# Patient Record
Sex: Male | Born: 1960 | Race: Black or African American | Hispanic: No | Smoking: Never smoker
Health system: Southern US, Community
[De-identification: ages and names within clinical notes are randomized; demographics above are authoritative.]

## PROBLEM LIST (undated history)

## (undated) DIAGNOSIS — I2699 Other pulmonary embolism without acute cor pulmonale: Secondary | ICD-10-CM

## (undated) DIAGNOSIS — E119 Type 2 diabetes mellitus without complications: Secondary | ICD-10-CM

## (undated) DIAGNOSIS — N182 Chronic kidney disease, stage 2 (mild): Secondary | ICD-10-CM

---

## 2014-10-08 ENCOUNTER — Inpatient Hospital Stay (HOSPITAL_COMMUNITY)
Admission: EM | Admit: 2014-10-08 | Discharge: 2014-10-11 | DRG: 638 | Disposition: A | Discharge status: Home / Self Care | Payer: 59 | Attending: Internal Medicine | Admitting: Internal Medicine

## 2014-10-08 ENCOUNTER — Emergency Department (HOSPITAL_COMMUNITY): Payer: 59

## 2014-10-08 ENCOUNTER — Encounter (HOSPITAL_COMMUNITY): Payer: Self-pay

## 2014-10-08 DIAGNOSIS — E669 Obesity, unspecified: Secondary | ICD-10-CM | POA: Diagnosis present

## 2014-10-08 DIAGNOSIS — B3741 Candidal cystitis and urethritis: Secondary | ICD-10-CM | POA: Diagnosis present

## 2014-10-08 DIAGNOSIS — K219 Gastro-esophageal reflux disease without esophagitis: Secondary | ICD-10-CM | POA: Diagnosis present

## 2014-10-08 DIAGNOSIS — N179 Acute kidney failure, unspecified: Secondary | ICD-10-CM | POA: Diagnosis not present

## 2014-10-08 DIAGNOSIS — E1110 Type 2 diabetes mellitus with ketoacidosis without coma: Secondary | ICD-10-CM | POA: Diagnosis present

## 2014-10-08 DIAGNOSIS — E1310 Other specified diabetes mellitus with ketoacidosis without coma: Principal | ICD-10-CM | POA: Diagnosis present

## 2014-10-08 DIAGNOSIS — Z6833 Body mass index (BMI) 33.0-33.9, adult: Secondary | ICD-10-CM | POA: Diagnosis not present

## 2014-10-08 DIAGNOSIS — D72829 Elevated white blood cell count, unspecified: Secondary | ICD-10-CM | POA: Diagnosis present

## 2014-10-08 DIAGNOSIS — E781 Pure hyperglyceridemia: Secondary | ICD-10-CM | POA: Diagnosis present

## 2014-10-08 DIAGNOSIS — Z8249 Family history of ischemic heart disease and other diseases of the circulatory system: Secondary | ICD-10-CM | POA: Diagnosis not present

## 2014-10-08 DIAGNOSIS — K5900 Constipation, unspecified: Secondary | ICD-10-CM | POA: Diagnosis present

## 2014-10-08 DIAGNOSIS — R358 Other polyuria: Secondary | ICD-10-CM | POA: Diagnosis not present

## 2014-10-08 LAB — URINALYSIS, ROUTINE W REFLEX MICROSCOPIC
Bilirubin Urine: NEGATIVE
Ketones, ur: 40 mg/dL — AB
Nitrite: NEGATIVE
PROTEIN: NEGATIVE mg/dL
Specific Gravity, Urine: 1.033 — ABNORMAL HIGH (ref 1.005–1.030)
UROBILINOGEN UA: 0.2 mg/dL (ref 0.0–1.0)
pH: 5 (ref 5.0–8.0)

## 2014-10-08 LAB — COMPREHENSIVE METABOLIC PANEL
ALT: 27 U/L (ref 17–63)
AST: 44 U/L — ABNORMAL HIGH (ref 15–41)
Albumin: 3.9 g/dL (ref 3.5–5.0)
Alkaline Phosphatase: 85 U/L (ref 38–126)
Anion gap: 16 — ABNORMAL HIGH (ref 5–15)
BUN: 28 mg/dL — ABNORMAL HIGH (ref 6–20)
CO2: 21 mmol/L — ABNORMAL LOW (ref 22–32)
Calcium: 8.7 mg/dL — ABNORMAL LOW (ref 8.9–10.3)
Chloride: 89 mmol/L — ABNORMAL LOW (ref 101–111)
Creatinine, Ser: 1.94 mg/dL — ABNORMAL HIGH (ref 0.61–1.24)
GFR calc non Af Amer: 38 mL/min — ABNORMAL LOW (ref >60)
GFR, EST AFRICAN AMERICAN: 44 mL/min — AB (ref >60)
GLUCOSE: 610 mg/dL — AB (ref 65–99)
Potassium: 4.9 mmol/L (ref 3.5–5.1)
Sodium: 126 mmol/L — ABNORMAL LOW (ref 135–145)
Total Bilirubin: 1.1 mg/dL (ref 0.3–1.2)
Total Protein: 8.5 g/dL — ABNORMAL HIGH (ref 6.5–8.1)

## 2014-10-08 LAB — CBC WITH DIFFERENTIAL/PLATELET
BASOS ABS: 0 10*3/uL (ref 0.0–0.1)
BASOS PCT: 0 % (ref 0–1)
EOS ABS: 0.1 10*3/uL (ref 0.0–0.7)
Eosinophils Relative: 1 % (ref 0–5)
HCT: 42.6 % (ref 39.0–52.0)
Hemoglobin: 14.8 g/dL (ref 13.0–17.0)
Lymphocytes Relative: 23 % (ref 12–46)
Lymphs Abs: 2.5 10*3/uL (ref 0.7–4.0)
MCH: 30.6 pg (ref 26.0–34.0)
MCHC: 34.7 g/dL (ref 30.0–36.0)
MCV: 88 fL (ref 78.0–100.0)
Monocytes Absolute: 0.8 10*3/uL (ref 0.1–1.0)
Monocytes Relative: 7 % (ref 3–12)
NEUTROS ABS: 7.4 10*3/uL (ref 1.7–7.7)
Neutrophils Relative %: 69 % (ref 43–77)
PLATELETS: 184 10*3/uL (ref 150–400)
RBC: 4.84 MIL/uL (ref 4.22–5.81)
RDW: 12.4 % (ref 11.5–15.5)
WBC: 10.9 10*3/uL — ABNORMAL HIGH (ref 4.0–10.5)

## 2014-10-08 LAB — GLUCOSE, CAPILLARY
GLUCOSE-CAPILLARY: 409 mg/dL — AB (ref 65–99)
GLUCOSE-CAPILLARY: 534 mg/dL — AB (ref 65–99)
GLUCOSE-CAPILLARY: 275 mg/dL — AB (ref 65–99)
GLUCOSE-CAPILLARY: 193 mg/dL — AB (ref 65–99)
Glucose-Capillary: 506 mg/dL — ABNORMAL HIGH (ref 65–99)

## 2014-10-08 LAB — BASIC METABOLIC PANEL
Anion gap: 11 (ref 5–15)
Anion gap: 9 (ref 5–15)
BUN: 24 mg/dL — ABNORMAL HIGH (ref 6–20)
BUN: 22 mg/dL — ABNORMAL HIGH (ref 6–20)
CHLORIDE: 96 mmol/L — AB (ref 101–111)
CHLORIDE: 103 mmol/L (ref 101–111)
CO2: 24 mmol/L (ref 22–32)
CO2: 22 mmol/L (ref 22–32)
CREATININE: 1.62 mg/dL — AB (ref 0.61–1.24)
CREATININE: 1.42 mg/dL — AB (ref 0.61–1.24)
Calcium: 8.1 mg/dL — ABNORMAL LOW (ref 8.9–10.3)
Calcium: 7.9 mg/dL — ABNORMAL LOW (ref 8.9–10.3)
GFR calc Af Amer: >60 mL/min (ref >60)
GFR calc non Af Amer: 48 mL/min — ABNORMAL LOW (ref >60)
GFR calc non Af Amer: 56 mL/min — ABNORMAL LOW (ref >60)
GFR, EST AFRICAN AMERICAN: 55 mL/min — AB (ref >60)
Glucose, Bld: 439 mg/dL — ABNORMAL HIGH (ref 65–99)
Glucose, Bld: 235 mg/dL — ABNORMAL HIGH (ref 65–99)
POTASSIUM: 4.4 mmol/L (ref 3.5–5.1)
POTASSIUM: 4 mmol/L (ref 3.5–5.1)
Sodium: 131 mmol/L — ABNORMAL LOW (ref 135–145)
Sodium: 134 mmol/L — ABNORMAL LOW (ref 135–145)

## 2014-10-08 LAB — TSH: TSH: 1.641 u[IU]/mL (ref 0.350–4.500)

## 2014-10-08 LAB — ETHANOL: Alcohol, Ethyl (B): <5 mg/dL (ref <5)

## 2014-10-08 LAB — BETA-HYDROXYBUTYRIC ACID: Beta-Hydroxybutyric Acid: 1.47 mmol/L — ABNORMAL HIGH (ref 0.05–0.27)

## 2014-10-08 LAB — URINE MICROSCOPIC-ADD ON

## 2014-10-08 LAB — T4, FREE: FREE T4: 1.23 ng/dL — AB (ref 0.61–1.12)

## 2014-10-08 LAB — CBG MONITORING, ED: Glucose-Capillary: 563 mg/dL (ref 65–99)

## 2014-10-08 LAB — MRSA PCR SCREENING: MRSA BY PCR: NEGATIVE

## 2014-10-08 MED ORDER — SODIUM CHLORIDE 0.9 % IV SOLN: INTRAVENOUS | Status: DC

## 2014-10-08 MED ORDER — SODIUM CHLORIDE 0.9 % IV SOLN
INTRAVENOUS | Status: AC
  Administered 2014-10-08 (×3): via INTRAVENOUS

## 2014-10-08 MED ORDER — SODIUM CHLORIDE 0.9 % IV SOLN
INTRAVENOUS | Status: DC
  Administered 2014-10-08: 4.5 [IU]/h via INTRAVENOUS
  Filled 2014-10-08: qty 2.5

## 2014-10-08 MED ORDER — INSULIN REGULAR HUMAN 100 UNIT/ML IJ SOLN
INTRAMUSCULAR | Status: DC
  Administered 2014-10-08: 4.5 [IU]/h via INTRAVENOUS
  Filled 2014-10-08: qty 2.5

## 2014-10-08 MED ORDER — SODIUM CHLORIDE 0.9 % IV BOLUS (SEPSIS)
1000.0000 mL | Freq: Once | INTRAVENOUS | Status: AC
  Administered 2014-10-08: 1000 mL via INTRAVENOUS

## 2014-10-08 MED ORDER — SODIUM CHLORIDE 0.9 % IV SOLN
INTRAVENOUS | Status: DC
  Administered 2014-10-08: 100 mL via INTRAVENOUS

## 2014-10-08 MED ORDER — ENOXAPARIN SODIUM 80 MG/0.8ML ~~LOC~~ SOLN
65.0000 mg | SUBCUTANEOUS | Status: DC
Start: 2014-10-08 — End: 2014-10-11
  Administered 2014-10-08 – 2014-10-10 (×3): 65 mg via SUBCUTANEOUS
  Filled 2014-10-08 (×5): qty 0.8

## 2014-10-08 MED ORDER — FAMOTIDINE 20 MG PO TABS
20.0000 mg | ORAL_TABLET | Freq: Two times a day (BID) | ORAL | Status: DC | PRN
  Filled 2014-10-08: qty 1

## 2014-10-08 MED ORDER — DEXTROSE-NACL 5-0.45 % IV SOLN: INTRAVENOUS | Status: DC

## 2014-10-08 MED ORDER — DEXTROSE-NACL 5-0.45 % IV SOLN
INTRAVENOUS | Status: DC
  Administered 2014-10-08: 75 mL via INTRAVENOUS

## 2014-10-08 MED ORDER — SODIUM CHLORIDE 0.9 % IV SOLN: 1000.0000 mL | INTRAVENOUS | Status: DC

## 2014-10-08 NOTE — H&P (Addendum)
[facility] History and Physical  Jacob Donaldson ONG:[id number] DOB: Jun 14, 1960 DOA: 10/08/2014  Referring physician: Dr Jeraldine Loots PCP: No primary care provider on file.   Chief Complaint:  Blurred vision, fatigue, polyuria, polydipsia, dizziness x 1 week  HPI:  54 year old obse male with no past medical hx presented to ED with one week of polyuria, polydipsia, nausea with several episodes of vomiting, dehydration, dizziness and lightheadedness with blurred vision. Patient denies any recent illness or travel. Denies being on any medication except for Pepcid for acid reflux symptoms. He denies any fevers or chills, chest pain, palpitations, shortness of breath, abdominal pain, diarrhea or dysuria. He reports poor appetite and losing some weight for the past 1 week. Denies any tingling or numbness in his extremities.  Course in the ED Patient had elevated blood pressure. Blood work done showed mild leukocytosis, normal hemoglobin and platelets. Chemistry showed sodium of 126 (134 corrected ), potassium of 4.9, anion gap of 16, BUN of 28 and creatinine 1.94 and glucose of 610. UA showed significant glucosuria and positive ketones. Patient started on a glucose stabilizer for new onset diabetes with DKA. Hospitalists admission requested to stepdown monitoring.  Review of Systems:  Constitutional: Denies fever, chills, diaphoresis, poor appetite and fatigue.  HEENT: blurred vision, dizziness, , denies eye pain, redness, hearing loss, ear pain, congestion, sore throat, , trouble swallowing, neck pain, neck stiffness and tinnitus.   Respiratory: Denies SOB, DOE, cough, chest tightness,  and wheezing.   Cardiovascular: Denies chest pain, palpitations and leg swelling.  Gastrointestinal: nausea, vomiting, abdominal pain, Denies  diarrhea, constipation, blood in stool and abdominal distention.  Genitourinary: Denies dysuria, hematuria, flank pain and difficulty urinating.  Endocrine: polyuria,  polydipsia, Denies: hot or cold intolerance, sweats Musculoskeletal: Denies myalgias, back pain, joint pain or swelling.  Skin: Denies pallor, rash and wound.  Neurological: dizziness, lightheadedness, weakness, denies seizures, syncope,  , numbness and headaches.  Hematological: Denies adenopathy.  Psychiatric/Behavioral: Denies confusion  History reviewed. No pertinent past medical history.  No past surgical history   Social History:  reports that he has never smoked. He does not have any smokeless tobacco history on file. He reports that he does not drink alcohol. His drug history is not on file.  No Known Allergies  Family history  mother had heart disease No family hx of diabetes mellitus   Prior to Admission medications   Medication Sig Start Date End Date Taking? Authorizing Provider  Calcium Carbonate Antacid (ROLAIDS EXTRA STRENGTH) 1177 MG CHEW Chew 1 tablet by mouth 3 (three) times daily as needed (acid reflux).   Yes Historical Provider, MD  famotidine (PEPCID) 20 MG tablet Take 20 mg by mouth 2 (two) times daily as needed for heartburn or indigestion.   Yes Historical Provider, MD     Physical Exam:  Filed Vitals:   10/08/14 1341 10/08/14 1518  BP: 154/104 132/94  Pulse: 102 84  Temp: 98.3 F (36.8 C)   TempSrc: Oral   Resp: 18 15  SpO2: 99% 99%    Constitutional: Vital signs reviewed.  Middle aged obese male in NAD HEENT: no pallor, no icterus, dry oral mucosa, no cervical lymphadenopathy Cardiovascular: RRR, S1 normal, S2 normal, no MRG Chest: CTAB, no wheezes, rales, or rhonchi Abdominal: Soft. Non-tender, non-distended, bowel sounds are normal,  Ext: warm, no edema Neurological: A&O x3, non focal  Labs on Admission:  Basic Metabolic Panel:  Recent Labs Lab 10/08/14 1358  NA 126*  K 4.9  CL 89*  CO2 21*  GLUCOSE 610*  BUN 28*  CREATININE 1.94*  CALCIUM 8.7*   Liver Function Tests:  Recent Labs Lab 10/08/14 1358  AST 44*  ALT 27   ALKPHOS 85  BILITOT 1.1  PROT 8.5*  ALBUMIN 3.9   No results for input(s): LIPASE, AMYLASE in the last 168 hours. No results for input(s): AMMONIA in the last 168 hours. CBC:  Recent Labs Lab 10/08/14 1358  WBC 10.9*  NEUTROABS 7.4  HGB 14.8  HCT 42.6  MCV 88.0  PLT 184   Cardiac Enzymes: No results for input(s): CKTOTAL, CKMB, CKMBINDEX, TROPONINI in the last 168 hours. BNP: Invalid input(s): POCBNP CBG:  Recent Labs Lab 10/08/14 1357  GLUCAP 563*    Radiological Exams on Admission: Ct Head Wo Contrast  10/08/2014   CLINICAL DATA:  Blurred vision.  Fatigue.  EXAM: CT HEAD WITHOUT CONTRAST  TECHNIQUE: Contiguous axial images were obtained from the base of the skull through the vertex without intravenous contrast.  COMPARISON:  None.  FINDINGS: No acute intracranial abnormality. Specifically, no hemorrhage, hydrocephalus, mass lesion, acute infarction, or significant intracranial injury. No acute calvarial abnormality. Visualized paranasal sinuses and mastoids clear. Orbital soft tissues unremarkable.  IMPRESSION: No acute intracranial abnormality.   Electronically Signed   By: Charlett Nose M.D.   On: 10/08/2014 14:53    EKG: pending  Assessment/Plan  DKA/ hyperosmolar non ketotic state Admit to stepdown Started on insulin drip in ED. Received 1L NS bolus in ED. Will order another 2 L NS bolus followed by maintenance  Fluid. Check BMET 3-4 hrs for closure of anion gap. Monitor k and mg. Once anion gap closes will add basal insulin and overlap drip for next 2 hours. Check A1C and beta Hydroxybutarate levels. Ketones+ in urine. Diabetic coordinator consult.  discussed extensively on lifestyle modifications, diet and blood glucose control as outpt.   Acute kidney injury  possibly prerenal associated with dehydration. UA reviewed.. Monitor with IV fluids.  Blurred vision  associated with hyperglycemia. Monitor with blood glucose control.  Pseudo Hyponatremia   corrected sodium of 138. Monitor lytes.   obesity counseled on weight loss and exercise. Check lipid panel in am  Diet:cardiac  DVT prophylaxis: sq lovenox   Code Status: full code Family Communication: discussed with patient and his wife at bedside Disposition Plan: admit to stepdown . Home possibly in 48 hrs  Shaquila Sigman [facility] Pager [phone]  Total time spent on admission :70 minutes  If 7PM-7AM, please contact night-coverage [email or web address] Password [email or web address] 10/08/2014, 4:05 PM

## 2014-10-08 NOTE — ED Notes (Addendum)
Raoul Pitch, RN notified of pt. Blood sugar 610 as reported by lab.

## 2014-10-08 NOTE — ED Notes (Signed)
Patient transported to CT 

## 2014-10-08 NOTE — ED Notes (Signed)
He c/o blurred vision, plus polydipsia and polyuria and unusual fatigue for about a week.  He is in no distress.  His wife states she is a Engineer, civil (consulting) with [employer] in [place] and checked pt. cbg this morning and it was "high".  Dr. Jeraldine Loots meets pt. In rm.

## 2014-10-08 NOTE — ED Provider Notes (Signed)
CSN: [id number]     Arrival date & time 10/08/14  1318 History   First MD Initiated Contact with Patient 10/08/14 1331     Chief Complaint  Patient presents with  . Blurred Vision     (Consider location/radiation/quality/duration/timing/severity/associated sxs/prior Treatment) HPI Patient presents with concern of new blurred vision, fatigue, anorexia, nausea. Symptoms began about one week ago, without clear precipitant. Since onset symptoms of been persistent, with associated polyuria. No recent travel, medication changes, diet changes. Patient takes no medication regularly, does not smoke, does not drink. Patient has no history of diabetes. Patient denies focal pain, focal weakness, confusion, disorders, speech deficit. Prior to the onset, the patient normal vision. Since onset of blurriness substantial, interfering with driving. Patient's wife is a Engineer, civil (consulting), checked his glucose, found it to be unusually high, just prior to arrival.  History reviewed. No pertinent past medical history. No past surgical history on file. No family history on file. History  Substance Use Topics  . Smoking status: Never Smoker   . Smokeless tobacco: Not on file  . Alcohol Use: No    Review of Systems  Constitutional:       Per HPI, otherwise negative  HENT:       Per HPI, otherwise negative  Eyes: Positive for visual disturbance.  Respiratory:       Per HPI, otherwise negative  Cardiovascular:       Per HPI, otherwise negative  Gastrointestinal: Positive for nausea and constipation. Negative for vomiting and abdominal pain.  Endocrine:       Negative aside from HPI  Genitourinary:       Neg aside from HPI   Musculoskeletal:       Per HPI, otherwise negative  Skin: Negative.   Neurological: Negative for syncope.      Allergies  Review of patient's allergies indicates no known allergies.  Home Medications   Prior to Admission medications   Medication Sig Start Date End Date  Taking? Authorizing Provider  Calcium Carbonate Antacid (ROLAIDS EXTRA STRENGTH) 1177 MG CHEW Chew 1 tablet by mouth 3 (three) times daily as needed (acid reflux).   Yes Historical Provider, MD  famotidine (PEPCID) 20 MG tablet Take 20 mg by mouth 2 (two) times daily as needed for heartburn or indigestion.   Yes Historical Provider, MD   BP 154/104 mmHg  Pulse 102  Temp(Src) 98.3 F (36.8 C) (Oral)  Resp 18  SpO2 99% Physical Exam  Constitutional: He is oriented to person, place, and time. He appears well-developed. No distress.  HENT:  Head: Normocephalic and atraumatic.  Eyes: Conjunctivae and EOM are normal. Pupils are equal, round, and reactive to light. Right eye exhibits no discharge. Left eye exhibits no discharge.  Patient cannot read signage 6 feet away.  Cardiovascular: Normal rate and regular rhythm.   Pulmonary/Chest: Effort normal. No stridor. No respiratory distress.  Abdominal: He exhibits no distension.  Musculoskeletal: He exhibits no edema.  Neurological: He is alert and oriented to person, place, and time. No cranial nerve deficit. He exhibits normal muscle tone. Coordination normal.  Skin: Skin is warm and dry.  Psychiatric: He has a normal mood and affect.  Nursing note and vitals reviewed.   ED Course  Procedures (including critical care time) Labs Review Labs Reviewed  COMPREHENSIVE METABOLIC PANEL - Abnormal; Notable for the following:    Sodium 126 (*)    Chloride 89 (*)    CO2 21 (*)    Glucose, Bld 610 (*)  BUN 28 (*)    Creatinine, Ser 1.94 (*)    Calcium 8.7 (*)    Total Protein 8.5 (*)    AST 44 (*)    GFR calc non Af Amer 38 (*)    GFR calc Af Amer 44 (*)    Anion gap 16 (*)    All other components within normal limits  CBC WITH DIFFERENTIAL/PLATELET - Abnormal; Notable for the following:    WBC 10.9 (*)    All other components within normal limits  CBG MONITORING, ED - Abnormal; Notable for the following:    Glucose-Capillary 563 (*)     All other components within normal limits  TSH  T4, FREE  URINALYSIS, ROUTINE W REFLEX MICROSCOPIC (NOT AT [facility])  ETHANOL  I-STAT CHEM 8, ED  I-STAT CG4 LACTIC ACID, ED    Imaging Review Ct Head Wo Contrast  10/08/2014   CLINICAL DATA:  Blurred vision.  Fatigue.  EXAM: CT HEAD WITHOUT CONTRAST  TECHNIQUE: Contiguous axial images were obtained from the base of the skull through the vertex without intravenous contrast.  COMPARISON:  None.  FINDINGS: No acute intracranial abnormality. Specifically, no hemorrhage, hydrocephalus, mass lesion, acute infarction, or significant intracranial injury. No acute calvarial abnormality. Visualized paranasal sinuses and mastoids clear. Orbital soft tissues unremarkable.  IMPRESSION: No acute intracranial abnormality.   Electronically Signed   By: Charlett Nose M.D.   On: 10/08/2014 14:53    Initial blood glucose greater than 500. Fluids started.  ECG w 58, sr, substantial artefact - abnormal  Labs notable for renal dysfunction, hyperglycemia, anion gap.  Patient started on the glucose stabilizer.    MDM  Patient presents with concern of fatigue, anorexia, blurred vision. Patient is awake, alert, answering questions appropriately. Dear patient's labs are most notable for hyperglycemia, with anion gap. Patient has no history of diabetes. Patient received fluid resuscitation well, and after labs are notable for the aforementioned changes, was started on a glucose protocol, with insulin drip. Patient tolerated this well, but with concern for new diabetic ketoacidosis, patient admitted for further evaluation and management.  CRITICAL CARE Performed by: Gerhard Munch Total critical care time: 35 Critical care time was exclusive of separately billable procedures and treating other patients. Critical care was necessary to treat or prevent imminent or life-threatening deterioration. Critical care was time spent personally by me on the following  activities: development of treatment plan with patient and/or surrogate as well as nursing, discussions with consultants, evaluation of patient's response to treatment, examination of patient, obtaining history from patient or surrogate, ordering and performing treatments and interventions, ordering and review of laboratory studies, ordering and review of radiographic studies, pulse oximetry and re-evaluation of patient's condition.    Gerhard Munch, MD 10/08/14 [phone]

## 2014-10-09 ENCOUNTER — Encounter (HOSPITAL_COMMUNITY): Payer: Self-pay | Admitting: *Deleted

## 2014-10-09 LAB — BASIC METABOLIC PANEL
ANION GAP: 8 (ref 5–15)
Anion gap: 6 (ref 5–15)
Anion gap: 8 (ref 5–15)
BUN: 19 mg/dL (ref 6–20)
BUN: 16 mg/dL (ref 6–20)
BUN: 16 mg/dL (ref 6–20)
CHLORIDE: 103 mmol/L (ref 101–111)
CHLORIDE: 103 mmol/L (ref 101–111)
CO2: 23 mmol/L (ref 22–32)
CO2: 23 mmol/L (ref 22–32)
CO2: 23 mmol/L (ref 22–32)
CREATININE: 1.38 mg/dL — AB (ref 0.61–1.24)
Calcium: 7.5 mg/dL — ABNORMAL LOW (ref 8.9–10.3)
Calcium: 7.7 mg/dL — ABNORMAL LOW (ref 8.9–10.3)
Calcium: 7.7 mg/dL — ABNORMAL LOW (ref 8.9–10.3)
Chloride: 105 mmol/L (ref 101–111)
Creatinine, Ser: 1.37 mg/dL — ABNORMAL HIGH (ref 0.61–1.24)
Creatinine, Ser: 1.27 mg/dL — ABNORMAL HIGH (ref 0.61–1.24)
GFR calc Af Amer: >60 mL/min (ref >60)
GFR calc Af Amer: >60 mL/min (ref >60)
GFR calc non Af Amer: 58 mL/min — ABNORMAL LOW (ref >60)
GFR calc non Af Amer: 58 mL/min — ABNORMAL LOW (ref >60)
GFR calc non Af Amer: >60 mL/min (ref >60)
GLUCOSE: 136 mg/dL — AB (ref 65–99)
Glucose, Bld: 184 mg/dL — ABNORMAL HIGH (ref 65–99)
Glucose, Bld: 230 mg/dL — ABNORMAL HIGH (ref 65–99)
POTASSIUM: 3.4 mmol/L — AB (ref 3.5–5.1)
Potassium: 3.6 mmol/L (ref 3.5–5.1)
Potassium: 3.8 mmol/L (ref 3.5–5.1)
SODIUM: 134 mmol/L — AB (ref 135–145)
Sodium: 134 mmol/L — ABNORMAL LOW (ref 135–145)
Sodium: 134 mmol/L — ABNORMAL LOW (ref 135–145)

## 2014-10-09 LAB — GLUCOSE, CAPILLARY
GLUCOSE-CAPILLARY: 177 mg/dL — AB (ref 65–99)
GLUCOSE-CAPILLARY: 174 mg/dL — AB (ref 65–99)
GLUCOSE-CAPILLARY: 229 mg/dL — AB (ref 65–99)
GLUCOSE-CAPILLARY: 292 mg/dL — AB (ref 65–99)
Glucose-Capillary: 129 mg/dL — ABNORMAL HIGH (ref 65–99)
Glucose-Capillary: 132 mg/dL — ABNORMAL HIGH (ref 65–99)
Glucose-Capillary: 155 mg/dL — ABNORMAL HIGH (ref 65–99)
Glucose-Capillary: 162 mg/dL — ABNORMAL HIGH (ref 65–99)
Glucose-Capillary: 165 mg/dL — ABNORMAL HIGH (ref 65–99)
Glucose-Capillary: 173 mg/dL — ABNORMAL HIGH (ref 65–99)
Glucose-Capillary: 214 mg/dL — ABNORMAL HIGH (ref 65–99)
Glucose-Capillary: 214 mg/dL — ABNORMAL HIGH (ref 65–99)
Glucose-Capillary: 350 mg/dL — ABNORMAL HIGH (ref 65–99)
Glucose-Capillary: 403 mg/dL — ABNORMAL HIGH (ref 65–99)

## 2014-10-09 LAB — LIPID PANEL
CHOLESTEROL: 139 mg/dL (ref 0–200)
HDL: 17 mg/dL — ABNORMAL LOW (ref >40)
LDL CALC: 80 mg/dL (ref 0–99)
Total CHOL/HDL Ratio: 8.2 RATIO
Triglycerides: 211 mg/dL — ABNORMAL HIGH (ref <150)
VLDL: 42 mg/dL — AB (ref 0–40)

## 2014-10-09 MED ORDER — SODIUM CHLORIDE 0.9 % IV SOLN
INTRAVENOUS | Status: AC
  Administered 2014-10-09 – 2014-10-10 (×2): via INTRAVENOUS

## 2014-10-09 MED ORDER — OMEGA-3-ACID ETHYL ESTERS 1 G PO CAPS
1.0000 g | ORAL_CAPSULE | Freq: Two times a day (BID) | ORAL | Status: DC
  Administered 2014-10-09 – 2014-10-11 (×5): 1 g via ORAL
  Filled 2014-10-09 (×7): qty 1

## 2014-10-09 MED ORDER — INSULIN ASPART 100 UNIT/ML ~~LOC~~ SOLN
0.0000 [IU] | Freq: Three times a day (TID) | SUBCUTANEOUS | Status: DC
  Administered 2014-10-09: 11 [IU] via SUBCUTANEOUS
  Administered 2014-10-09: 5 [IU] via SUBCUTANEOUS
  Administered 2014-10-10: 8 [IU] via SUBCUTANEOUS
  Administered 2014-10-10: 11 [IU] via SUBCUTANEOUS
  Administered 2014-10-10: 5 [IU] via SUBCUTANEOUS
  Administered 2014-10-11: 8 [IU] via SUBCUTANEOUS
  Administered 2014-10-11: 5 [IU] via SUBCUTANEOUS
  Administered 2014-10-11: 8 [IU] via SUBCUTANEOUS

## 2014-10-09 MED ORDER — POTASSIUM CHLORIDE CRYS ER 20 MEQ PO TBCR
40.0000 meq | EXTENDED_RELEASE_TABLET | Freq: Once | ORAL | Status: AC
  Administered 2014-10-09: 40 meq via ORAL
  Filled 2014-10-09: qty 2

## 2014-10-09 MED ORDER — INSULIN GLARGINE 100 UNIT/ML ~~LOC~~ SOLN
40.0000 [IU] | Freq: Every day | SUBCUTANEOUS | Status: DC
  Administered 2014-10-09 – 2014-10-11 (×3): 40 [IU] via SUBCUTANEOUS
  Filled 2014-10-09 (×3): qty 0.4

## 2014-10-09 MED ORDER — INSULIN ASPART 100 UNIT/ML ~~LOC~~ SOLN
0.0000 [IU] | Freq: Every day | SUBCUTANEOUS | Status: DC
  Administered 2014-10-09: 3 [IU] via SUBCUTANEOUS

## 2014-10-09 NOTE — Progress Notes (Signed)
[facility] PROGRESS NOTE  Jacob Donaldson FAO:[id number] DOB: 1960-10-14 DOA: 10/08/2014 PCP: No primary care provider on file.  Assessment/Plan: Diabetic ketoacidosis with new-onset diabetes Patient admitted to stepdown with glucose stabilizer and aggressive IV hydration. Anion gap is now closed. Placed on 14 units of Lantus with sliding scale insulin. Pending A1c. Elevated Beta hydroxybutarate level. -Diabetic coordinator consult. Patient discussed in detail about lifestyle modifications, diet and medication adherence and exercise to lose weight. He would also need outpatient ophthalmology exam given his blurred vision symptoms. (Has improved this morning).  Acute kidney injury vs diabetic nephropathy Renal function improving in a.m. labs with hydration. Continue IV hydration for now and monitor again in a.m.    Obesity Counseling on weight loss and exercise. Has high triglycerides. Will place him on fish oil.   DVT prophylaxis: Subcutaneous Lovenox diet: Diabetic  Code Status: Full code Family Communication: None at bedside Disposition Plan: transfer to  MedSurg. Home possibly tomorrow   Consultants:  none  Procedures: none   Antibiotics:  none  HPI/Subjective: Patient reports much better. No further nausea, polyuria or polydipsia. Has some blurred vision but much improved.  Objective: Filed Vitals:   10/09/14 0800  BP: 116/67  Pulse: 92  Temp: 98.5 F (36.9 C)  Resp: 26    Intake/Output Summary (Last 24 hours) at 10/09/14 1125 Last data filed at 10/09/14 1000  Gross per 24 hour  Intake 5572.73 ml  Output   1475 ml  Net 4097.73 ml   Filed Weights   10/08/14 1518  Weight: 136.9 kg (301 lb 13 oz)    Exam:   General:  no acute distress  HEENT: Moist oral mucosa  Chest: Clear to auscultation bilaterally  CVS: Normal S1 and S2, no murmurs,   GI: Soft, nondistended, nontender, bowel sounds present  Musculoskeletal warm, no edema    Data  Reviewed: Basic Metabolic Panel:  Recent Labs Lab 10/08/14 1806 10/08/14 2216 10/09/14 0205 10/09/14 0558 10/09/14 1000  NA 131* 134* 134* 134* 134*  K 4.4 4.0 3.4* 3.6 3.8  CL 96* 103 105 103 103  CO2 24 22 23 23 23   GLUCOSE 439* 235* 136* 184* 230*  BUN 24* 22* 19 16 16   CREATININE 1.62* 1.42* 1.38* 1.37* 1.27*  CALCIUM 8.1* 7.9* 7.5* 7.7* 7.7*   Liver Function Tests:  Recent Labs Lab 10/08/14 1358  AST 44*  ALT 27  ALKPHOS 85  BILITOT 1.1  PROT 8.5*  ALBUMIN 3.9   No results for input(s): LIPASE, AMYLASE in the last 168 hours. No results for input(s): AMMONIA in the last 168 hours. CBC:  Recent Labs Lab 10/08/14 1358  WBC 10.9*  NEUTROABS 7.4  HGB 14.8  HCT 42.6  MCV 88.0  PLT 184   Cardiac Enzymes: No results for input(s): CKTOTAL, CKMB, CKMBINDEX, TROPONINI in the last 168 hours. BNP (last 3 results) No results for input(s): BNP in the last 8760 hours.  ProBNP (last 3 results) No results for input(s): PROBNP in the last 8760 hours.  CBG:  Recent Labs Lab 10/09/14 0452 10/09/14 0549 10/09/14 0649 10/09/14 0739 10/09/14 0942  GLUCAP 165* 177* 173* 174* 229*    Recent Results (from the past 240 hour(s))  MRSA PCR Screening     Status: None   Collection Time: 10/08/14  1:18 PM  Result Value Ref Range Status   MRSA by PCR NEGATIVE NEGATIVE Final    Comment:        The GeneXpert MRSA Assay (FDA approved for  NASAL specimens only), is one component of a comprehensive MRSA colonization surveillance program. It is not intended to diagnose MRSA infection nor to guide or monitor treatment for MRSA infections.      Studies: Ct Head Wo Contrast  10/08/2014   CLINICAL DATA:  Blurred vision.  Fatigue.  EXAM: CT HEAD WITHOUT CONTRAST  TECHNIQUE: Contiguous axial images were obtained from the base of the skull through the vertex without intravenous contrast.  COMPARISON:  None.  FINDINGS: No acute intracranial abnormality. Specifically, no  hemorrhage, hydrocephalus, mass lesion, acute infarction, or significant intracranial injury. No acute calvarial abnormality. Visualized paranasal sinuses and mastoids clear. Orbital soft tissues unremarkable.  IMPRESSION: No acute intracranial abnormality.   Electronically Signed   By: Charlett Nose M.D.   On: 10/08/2014 14:53    Scheduled Meds: . enoxaparin (LOVENOX) injection  65 mg Subcutaneous Q24H  . insulin aspart  0-15 Units Subcutaneous TID WC  . insulin aspart  0-5 Units Subcutaneous QHS  . insulin glargine  40 Units Subcutaneous Daily   Continuous Infusions: . sodium chloride 100 mL/hr at 10/09/14 1036  . insulin (NOVOLIN-R) infusion Stopped (10/09/14 1034)      Time spent: 25 minutes    Rhythm Wigfall  [facility] Pager [phone] If 7PM-7AM, please contact night-coverage at [email or web address], password [facility] 10/09/2014, 11:25 AM  LOS: 1 day

## 2014-10-10 DIAGNOSIS — E669 Obesity, unspecified: Secondary | ICD-10-CM

## 2014-10-10 DIAGNOSIS — E1310 Other specified diabetes mellitus with ketoacidosis without coma: Principal | ICD-10-CM

## 2014-10-10 DIAGNOSIS — N179 Acute kidney failure, unspecified: Secondary | ICD-10-CM

## 2014-10-10 LAB — GLUCOSE, CAPILLARY
GLUCOSE-CAPILLARY: 337 mg/dL — AB (ref 65–99)
Glucose-Capillary: 227 mg/dL — ABNORMAL HIGH (ref 65–99)
Glucose-Capillary: 291 mg/dL — ABNORMAL HIGH (ref 65–99)
Glucose-Capillary: 197 mg/dL — ABNORMAL HIGH (ref 65–99)

## 2014-10-10 LAB — HEMOGLOBIN A1C
Hgb A1c MFr Bld: 13.6 % — ABNORMAL HIGH (ref 4.8–5.6)
Mean Plasma Glucose: 344 mg/dL

## 2014-10-10 MED ORDER — SENNOSIDES-DOCUSATE SODIUM 8.6-50 MG PO TABS
1.0000 | ORAL_TABLET | Freq: Two times a day (BID) | ORAL | Status: DC
  Administered 2014-10-10 – 2014-10-11 (×2): 1 via ORAL
  Filled 2014-10-10 (×3): qty 1

## 2014-10-10 MED ORDER — FLUCONAZOLE 200 MG PO TABS
200.0000 mg | ORAL_TABLET | Freq: Once | ORAL | Status: AC
  Administered 2014-10-10: 200 mg via ORAL
  Filled 2014-10-10: qty 1

## 2014-10-10 MED ORDER — LIVING WELL WITH DIABETES BOOK
Freq: Once | Status: AC
  Administered 2014-10-10: 13:00:00
  Filled 2014-10-10: qty 1

## 2014-10-10 NOTE — Progress Notes (Signed)
[facility] PROGRESS NOTE  Jeff Bloom RUE:[id number] DOB: 11/25/1960 DOA: 10/08/2014 PCP: No primary care provider on file.  Assessment/Plan: Diabetic ketoacidosis with new-onset diabetes Elevated Beta hydroxybutarate level. Patient admitted to stepdown with glucose stabilizer and aggressive IV hydration. Anion gap is now closed.  started Lantus with sliding scale insulin. Pending A1c.  -Diabetic coordinator consult.  Patient discussed in detail about lifestyle modifications, diet and medication adherence and exercise to lose weight. He would also need outpatient ophthalmology exam given his blurred vision symptoms. (Has improved this morning).  Acute kidney injury vs diabetic nephropathy Renal function improving in a.m. labs with hydration. Continue IV hydration for now and monitor again in a.m.   Candida cystitis? Start diflucan   Obesity Counseling on weight loss and exercise. Has high triglycerides. Will place him on fish oil.  Constipation: stool softener   DVT prophylaxis: Subcutaneous Lovenox diet: Diabetic  Code Status: Full code Family Communication: None at bedside Disposition Plan:  Home tomorrow   Consultants:  none  Procedures: none   Antibiotics:  none  HPI/Subjective: Patient reports much better.  Has some blurred vision but much improved. Some dizziness standing up. C/o being constipated.  Objective: Filed Vitals:   10/10/14 0328  BP: 130/80  Pulse: 76  Temp: 98 F (36.7 C)  Resp: 21    Intake/Output Summary (Last 24 hours) at 10/10/14 1345 Last data filed at 10/10/14 0813  Gross per 24 hour  Intake   1322 ml  Output      0 ml  Net   1322 ml   Filed Weights   10/08/14 1518  Weight: 136.9 kg (301 lb 13 oz)    Exam:   General:  no acute distress  HEENT: Moist oral mucosa  Chest: Clear to auscultation bilaterally  CVS: Normal S1 and S2, no murmurs,   GI: Soft, nondistended, nontender, bowel sounds  present  Musculoskeletal warm, no edema    Data Reviewed: Basic Metabolic Panel:  Recent Labs Lab 10/08/14 1806 10/08/14 2216 10/09/14 0205 10/09/14 0558 10/09/14 1000  NA 131* 134* 134* 134* 134*  K 4.4 4.0 3.4* 3.6 3.8  CL 96* 103 105 103 103  CO2 GLUCOSE 439* 235* 136* 184* 230*  BUN 24* 22* CREATININE 1.62* 1.42* 1.38* 1.37* 1.27*  CALCIUM 8.1* 7.9* 7.5* 7.7* 7.7*   Liver Function Tests:  Recent Labs Lab 10/08/14 1358  AST 44*  ALT 27  ALKPHOS 85  BILITOT 1.1  PROT 8.5*  ALBUMIN 3.9   No results for input(s): LIPASE, AMYLASE in the last 168 hours. No results for input(s): AMMONIA in the last 168 hours. CBC:  Recent Labs Lab 10/08/14 1358  WBC 10.9*  NEUTROABS 7.4  HGB 14.8  HCT 42.6  MCV 88.0  PLT 184   Cardiac Enzymes: No results for input(s): CKTOTAL, CKMB, CKMBINDEX, TROPONINI in the last 168 hours. BNP (last 3 results) No results for input(s): BNP in the last 8760 hours.  ProBNP (last 3 results) No results for input(s): PROBNP in the last 8760 hours.  CBG:  Recent Labs Lab 10/09/14 1206 10/09/14 1640 10/09/14 2043 10/10/14 0728 10/10/14 1202  GLUCAP 214* 350* 292* 227* 291*    Recent Results (from the past 240 hour(s))  MRSA PCR Irl Bodieng     Status: None   Collection Time: 10/08/14  1:18 PM  Result Value Ref Range Status   MRSA by PCR NEGATIVE NEGATIVE Final    Comment:  The GeneXpert MRSA Assay (FDA approved for NASAL specimens only), is one component of a comprehensive MRSA colonization surveillance program. It is not intended to diagnose MRSA infection nor to guide or monitor treatment for MRSA infections.      Studies: Ct Head Wo Contrast  10/08/2014   CLINICAL DATA:  Blurred vision.  Fatigue.  EXAM: CT HEAD WITHOUT CONTRAST  TECHNIQUE: Contiguous axial images were obtained from the base of the skull through the vertex without intravenous contrast.  COMPARISON:  None.  FINDINGS: No  acute intracranial abnormality. Specifically, no hemorrhage, hydrocephalus, mass lesion, acute infarction, or significant intracranial injury. No acute calvarial abnormality. Visualized paranasal sinuses and mastoids clear. Orbital soft tissues unremarkable.  IMPRESSION: No acute intracranial abnormality.   Electronically Signed   By: Charlett Nose M.D.   On: 10/08/2014 14:53    Scheduled Meds: . enoxaparin (LOVENOX) injection  65 mg Subcutaneous Q24H  . fluconazole  200 mg Oral Once  . insulin aspart  0-15 Units Subcutaneous TID WC  . insulin aspart  0-5 Units Subcutaneous QHS  . insulin glargine  40 Units Subcutaneous Daily  . omega-3 acid ethyl esters  1 g Oral BID  . senna-docusate  1 tablet Oral BID   Continuous Infusions:      Time spent: 25 minutes    Shaquoia Miers MD PhD  [facility] Pager [phone] If 7PM-7AM, please contact night-coverage at [email or web address], password [facility] 10/10/2014, 1:45 PM  LOS: 2 days

## 2014-10-10 NOTE — Progress Notes (Signed)
Inpatient Diabetes Program Recommendations  AACE/ADA: New Consensus Statement on Inpatient Glycemic Control (2013)  Target Ranges:  Prepandial:   less than 140 mg/dL      Peak postprandial:   less than 180 mg/dL (1-2 hours)      Critically ill patients:  140 - 180 mg/dL   Reason for Visit: Diabetes Consult  Diabetes history: None - Newly-diagnosed Outpatient Diabetes medications: None Current orders for Inpatient glycemic control: Lantus 40 units QHS, Novolog moderate tidwc and hs  54 year old male presents with concern of new blurred vision, fatigue, anorexia, nausea. Found to be in DKA. GlucoStabilizer started and has been transitioned to basal / bolus insulin. Blood sugars still elevated.  Recommendations: Increase Lantus to 45 units QHS Add Novolog 6 units tidwc for meal coverage insulin.  Ordered insulin pen starter kit. Demonstrated insulin pen administration and pt verbalized understanding.  States wife is Therapist, sports and she also uses insulin pen. Wife has obtained PCP for pt. Stressed importance of monitoring blood sugars and taking logbook to MD appt. Has meter and supplies at home. Will need order for insulin pen and pen needles if pt is going home on insulin. To watch diabetes videos on pt ed channel.  Discussed above with RN. Pt seems to have good understanding of f/u with PCP and importance of keeping blood sugars in control to prevent complications.  Thank you. Lorenda Peck, RD, LDN, CDE Inpatient Diabetes Coordinator [phone]

## 2014-10-11 DIAGNOSIS — N179 Acute kidney failure, unspecified: Secondary | ICD-10-CM | POA: Insufficient documentation

## 2014-10-11 LAB — GLUCOSE, CAPILLARY
GLUCOSE-CAPILLARY: 273 mg/dL — AB (ref 65–99)
Glucose-Capillary: 216 mg/dL — ABNORMAL HIGH (ref 65–99)
Glucose-Capillary: 270 mg/dL — ABNORMAL HIGH (ref 65–99)
Glucose-Capillary: 336 mg/dL — ABNORMAL HIGH (ref 65–99)

## 2014-10-11 LAB — COMPREHENSIVE METABOLIC PANEL
ALK PHOS: 55 U/L (ref 38–126)
ALT: 29 U/L (ref 17–63)
AST: 47 U/L — AB (ref 15–41)
Albumin: 3.2 g/dL — ABNORMAL LOW (ref 3.5–5.0)
Anion gap: 8 (ref 5–15)
BUN: 10 mg/dL (ref 6–20)
CHLORIDE: 108 mmol/L (ref 101–111)
CO2: 22 mmol/L (ref 22–32)
Calcium: 8.2 mg/dL — ABNORMAL LOW (ref 8.9–10.3)
Creatinine, Ser: 1.14 mg/dL (ref 0.61–1.24)
GFR calc Af Amer: >60 mL/min (ref >60)
GFR calc non Af Amer: >60 mL/min (ref >60)
Glucose, Bld: 186 mg/dL — ABNORMAL HIGH (ref 65–99)
Potassium: 4 mmol/L (ref 3.5–5.1)
SODIUM: 138 mmol/L (ref 135–145)
Total Bilirubin: 0.7 mg/dL (ref 0.3–1.2)
Total Protein: 6.6 g/dL (ref 6.5–8.1)

## 2014-10-11 LAB — CBC
HCT: 37.5 % — ABNORMAL LOW (ref 39.0–52.0)
HEMOGLOBIN: 12.8 g/dL — AB (ref 13.0–17.0)
MCH: 30.5 pg (ref 26.0–34.0)
MCHC: 34.1 g/dL (ref 30.0–36.0)
MCV: 89.3 fL (ref 78.0–100.0)
PLATELETS: 144 10*3/uL — AB (ref 150–400)
RBC: 4.2 MIL/uL — AB (ref 4.22–5.81)
RDW: 12.8 % (ref 11.5–15.5)
WBC: 6.7 10*3/uL (ref 4.0–10.5)

## 2014-10-11 MED ORDER — OMEGA-3-ACID ETHYL ESTERS 1 G PO CAPS
1.0000 g | ORAL_CAPSULE | Freq: Two times a day (BID) | ORAL | Status: DC
Start: 2014-10-11 — End: 2014-11-20

## 2014-10-11 MED ORDER — INSULIN ASPART 100 UNIT/ML FLEXPEN: PEN_INJECTOR | SUBCUTANEOUS | Status: DC

## 2014-10-11 MED ORDER — ASPIRIN EC 81 MG PO TBEC: 81.0000 mg | DELAYED_RELEASE_TABLET | Freq: Every day | ORAL | Status: DC

## 2014-10-11 MED ORDER — INSULIN GLARGINE 100 UNIT/ML SOLOSTAR PEN: 40.0000 [IU] | PEN_INJECTOR | Freq: Every day | SUBCUTANEOUS | Status: DC

## 2014-10-11 NOTE — Progress Notes (Signed)
Pt DC home with spouse. He was given prescriptions, education, and instructions. Says he has an understanding.

## 2014-10-11 NOTE — Progress Notes (Signed)
Inpatient Diabetes Program Recommendations  AACE/ADA: New Consensus Statement on Inpatient Glycemic Control (2013)  Target Ranges:  Prepandial:   less than 140 mg/dL      Peak postprandial:   less than 180 mg/dL (1-2 hours)      Critically ill patients:  140 - 180 mg/dL   Ordered OP education at [facility] with cosign required. Attempted to call patient, but patient must have been discharged and out of his room [facility] will call him to set up an appt if patient is agreeable to go.  Thank you Lenor Coffin, RN, MSN, CDE  Diabetes Inpatient Program Office: [phone] Pager: [phone] 8:00 am to 5:00 pm

## 2014-10-11 NOTE — Discharge Summary (Signed)
Discharge Summary  Jacob Donaldson NWG:[id number] DOB: 1960/04/26  PCP: No primary care provider on file.  Admit date: 10/08/2014 Discharge date: 10/11/2014  Time spent: <57mins  Recommendations for Outpatient Follow-up:  1. F/u with PMD within a week, repeat cbc/cmp at follow up.  Discharge Diagnoses:  Active Hospital Problems   Diagnosis Date Noted  . DKA, type 2 10/08/2014  . Obesity 10/08/2014    Resolved Hospital Problems   Diagnosis Date Noted Date Resolved  No resolved problems to display.    Discharge Condition: stable  Diet recommendation: heart healthy/carb modified  Filed Weights   10/08/14 1518  Weight: 136.9 kg (301 lb 13 oz)    History of present illness:  54 year old obse male with no past medical hx presented to ED with one week of polyuria, polydipsia, nausea with several episodes of vomiting, dehydration, dizziness and lightheadedness with blurred vision. Patient denies any recent illness or travel. Denies being on any medication except for Pepcid for acid reflux symptoms. He denies any fevers or chills, chest pain, palpitations, shortness of breath, abdominal pain, diarrhea or dysuria. He reports poor appetite and losing some weight for the past 1 week. Denies any tingling or numbness in his extremities. Course in the ED Patient had elevated blood pressure. Blood work done showed mild leukocytosis, normal hemoglobin and platelets. Chemistry showed sodium of 126 (134 corrected ), potassium of 4.9, anion gap of 16, BUN of 28 and creatinine 1.94 and glucose of 610. UA showed significant glucosuria and positive ketones. Patient started on a glucose stabilizer for new onset diabetes with DKA. Hospitalists admission requested to stepdown monitoring.  Hospital Course:  Principal Problem:   DKA, type 2 Active Problems:   Obesity Diabetic ketoacidosis with new-onset diabetes Elevated Beta hydroxybutarate level. Patient admitted to stepdown with glucose stabilizer and  aggressive IV hydration. Anion gap is now closed.  started Lantus with sliding scale insulin. A1c 13.6 -Diabetic coordinator consult. Patient discussed in detail about lifestyle modifications, diet and medication adherence and exercise to lose weight. He would also need outpatient ophthalmology exam given his blurred vision symptoms. (Has improved this morning).  Acute kidney injury vs diabetic nephropathy Cr back to normal with hydration  Candida cystitis? diflucan   Obesity Counseling on weight loss and exercise. Has high triglycerides. place him on fish oil.  Constipation: stool softener     Code Status: Full code Family Communication: None at bedside Disposition Plan: Home    Consultants:  none  Procedures: none   Antibiotics:  none  Discharge Exam: BP 122/79 mmHg  Pulse 67  Temp(Src) 98.5 F (36.9 C) (Oral)  Resp 21  Ht 6\' 7"  (2.007 m)  Wt 136.9 kg (301 lb 13 oz)  BMI 33.99 kg/m2  SpO2 100%   General: no acute distress, obses  HEENT: Moist oral mucosa  Chest: Clear to auscultation bilaterally  CVS: Normal S1 and S2, no murmurs,  GI: Soft, nondistended, nontender, bowel sounds present  Musculoskeletal warm, no edema   Discharge Instructions You were cared for by a hospitalist during your hospital stay. If you have any questions about your discharge medications or the care you received while you were in the hospital after you are discharged, you can call the unit and asked to speak with the hospitalist on call if the hospitalist that took care of you is not available. Once you are discharged, your primary care physician will handle any further medical issues. Please note that NO REFILLS for any discharge medications will  be authorized once you are discharged, as it is imperative that you return to your primary care physician (or establish a relationship with a primary care physician if you do not have one) for your aftercare needs so that they can  reassess your need for medications and monitor your lab values.  Discharge Instructions    Diet - low sodium heart healthy    Complete by:  As directed      Increase activity slowly    Complete by:  As directed             Medication List    TAKE these medications        aspirin EC 81 MG tablet  Take 1 tablet (81 mg total) by mouth daily.     famotidine 20 MG tablet  Commonly known as:  PEPCID  Take 20 mg by mouth 2 (two) times daily as needed for heartburn or indigestion.     insulin aspart 100 UNIT/ML FlexPen  Commonly known as:  NOVOLOG FLEXPEN  Before each meal 3 times a day, 140-199 - 2 units, 200-250 - 4 units, 251-299 - 6 units,  300-349 - 8 units,  350 or above 10 units. Insulin PEN if approved, provide syringes and needles if needed.     Insulin Glargine 100 UNIT/ML Solostar Pen  Commonly known as:  LANTUS SOLOSTAR  Inject 40 Units into the skin daily at 10 pm.     omega-3 acid ethyl esters 1 G capsule  Commonly known as:  LOVAZA  Take 1 capsule (1 g total) by mouth 2 (two) times daily.     ROLAIDS EXTRA STRENGTH 1177 MG Chew  Generic drug:  Calcium Carbonate Antacid  Chew 1 tablet by mouth 3 (three) times daily as needed (acid reflux).       No Known Allergies    The results of significant diagnostics from this hospitalization (including imaging, microbiology, ancillary and laboratory) are listed below for reference.    Significant Diagnostic Studies: Ct Head Wo Contrast  10/08/2014   CLINICAL DATA:  Blurred vision.  Fatigue.  EXAM: CT HEAD WITHOUT CONTRAST  TECHNIQUE: Contiguous axial images were obtained from the base of the skull through the vertex without intravenous contrast.  COMPARISON:  None.  FINDINGS: No acute intracranial abnormality. Specifically, no hemorrhage, hydrocephalus, mass lesion, acute infarction, or significant intracranial injury. No acute calvarial abnormality. Visualized paranasal sinuses and mastoids clear. Orbital soft tissues  unremarkable.  IMPRESSION: No acute intracranial abnormality.   Electronically Signed   By: Charlett Nose M.D.   On: 10/08/2014 14:53    Microbiology: Recent Results (from the past 240 hour(s))  MRSA PCR Screening     Status: None   Collection Time: 10/08/14  1:18 PM  Result Value Ref Range Status   MRSA by PCR NEGATIVE NEGATIVE Final    Comment:        The GeneXpert MRSA Assay (FDA approved for NASAL specimens only), is one component of a comprehensive MRSA colonization surveillance program. It is not intended to diagnose MRSA infection nor to guide or monitor treatment for MRSA infections.      Labs: Basic Metabolic Panel:  Recent Labs Lab 10/08/14 2216 10/09/14 0205 10/09/14 0558 10/09/14 1000 10/11/14 0515  NA 134* 134* 134* 134* 138  K 4.0 3.4* 3.6 3.8 4.0  CL 103 105 103 103 108  CO2 22 23 23 23 22   GLUCOSE 235* 136* 184* 230* 186*  BUN 22* 19 16 16 10   CREATININE  1.42* 1.38* 1.37* 1.27* 1.14  CALCIUM 7.9* 7.5* 7.7* 7.7* 8.2*   Liver Function Tests:  Recent Labs Lab 10/08/14 1358 10/11/14 0515  AST 44* 47*  ALT 27 29  ALKPHOS 85 55  BILITOT 1.1 0.7  PROT 8.5* 6.6  ALBUMIN 3.9 3.2*   No results for input(s): LIPASE, AMYLASE in the last 168 hours. No results for input(s): AMMONIA in the last 168 hours. CBC:  Recent Labs Lab 10/08/14 1358 10/11/14 0515  WBC 10.9* 6.7  NEUTROABS 7.4  --   HGB 14.8 12.8*  HCT 42.6 37.5*  MCV 88.0 89.3  PLT 184 144*   Cardiac Enzymes: No results for input(s): CKTOTAL, CKMB, CKMBINDEX, TROPONINI in the last 168 hours. BNP: BNP (last 3 results) No results for input(s): BNP in the last 8760 hours.  ProBNP (last 3 results) No results for input(s): PROBNP in the last 8760 hours.  CBG:  Recent Labs Lab 10/10/14 0728 10/10/14 1202 10/10/14 1649 10/10/14 2107 10/11/14 0715  GLUCAP 227* 291* 337* 197* 216*       SignedAlbertine Grates MD, PhD  [facility] 10/11/2014, 8:39 AM

## 2014-11-19 ENCOUNTER — Encounter (HOSPITAL_COMMUNITY): Payer: Self-pay | Admitting: Emergency Medicine

## 2014-11-19 ENCOUNTER — Emergency Department (HOSPITAL_COMMUNITY): Payer: 59

## 2014-11-19 ENCOUNTER — Inpatient Hospital Stay (HOSPITAL_COMMUNITY)
Admission: EM | Admit: 2014-11-19 | Discharge: 2014-11-20 | DRG: 176 | Disposition: A | Discharge status: Home / Self Care | Payer: 59 | Attending: Internal Medicine | Admitting: Internal Medicine

## 2014-11-19 DIAGNOSIS — R590 Localized enlarged lymph nodes: Secondary | ICD-10-CM | POA: Diagnosis present

## 2014-11-19 DIAGNOSIS — I2699 Other pulmonary embolism without acute cor pulmonale: Secondary | ICD-10-CM | POA: Diagnosis not present

## 2014-11-19 DIAGNOSIS — Z791 Long term (current) use of non-steroidal anti-inflammatories (NSAID): Secondary | ICD-10-CM

## 2014-11-19 DIAGNOSIS — I10 Essential (primary) hypertension: Secondary | ICD-10-CM | POA: Diagnosis present

## 2014-11-19 DIAGNOSIS — N179 Acute kidney failure, unspecified: Secondary | ICD-10-CM | POA: Diagnosis present

## 2014-11-19 DIAGNOSIS — R0602 Shortness of breath: Secondary | ICD-10-CM | POA: Diagnosis not present

## 2014-11-19 DIAGNOSIS — Z8249 Family history of ischemic heart disease and other diseases of the circulatory system: Secondary | ICD-10-CM

## 2014-11-19 DIAGNOSIS — Z79899 Other long term (current) drug therapy: Secondary | ICD-10-CM

## 2014-11-19 DIAGNOSIS — E119 Type 2 diabetes mellitus without complications: Secondary | ICD-10-CM | POA: Diagnosis present

## 2014-11-19 DIAGNOSIS — N183 Chronic kidney disease, stage 3 unspecified: Secondary | ICD-10-CM

## 2014-11-19 DIAGNOSIS — Z794 Long term (current) use of insulin: Secondary | ICD-10-CM

## 2014-11-19 DIAGNOSIS — E669 Obesity, unspecified: Secondary | ICD-10-CM | POA: Diagnosis present

## 2014-11-19 DIAGNOSIS — D649 Anemia, unspecified: Secondary | ICD-10-CM | POA: Diagnosis present

## 2014-11-19 DIAGNOSIS — Z833 Family history of diabetes mellitus: Secondary | ICD-10-CM

## 2014-11-19 DIAGNOSIS — R0781 Pleurodynia: Secondary | ICD-10-CM | POA: Diagnosis present

## 2014-11-19 HISTORY — DX: Type 2 diabetes mellitus without complications: E11.9

## 2014-11-19 LAB — BASIC METABOLIC PANEL
ANION GAP: 7 (ref 5–15)
BUN: 12 mg/dL (ref 6–20)
CALCIUM: 9 mg/dL (ref 8.9–10.3)
CO2: 26 mmol/L (ref 22–32)
Chloride: 107 mmol/L (ref 101–111)
Creatinine, Ser: 1.34 mg/dL — ABNORMAL HIGH (ref 0.61–1.24)
GFR calc Af Amer: >60 mL/min (ref >60)
GFR calc non Af Amer: 60 mL/min — ABNORMAL LOW (ref >60)
Glucose, Bld: 144 mg/dL — ABNORMAL HIGH (ref 65–99)
Potassium: 4 mmol/L (ref 3.5–5.1)
Sodium: 140 mmol/L (ref 135–145)

## 2014-11-19 LAB — CBC
HCT: 38.5 % — ABNORMAL LOW (ref 39.0–52.0)
HEMOGLOBIN: 12.5 g/dL — AB (ref 13.0–17.0)
MCH: 29.9 pg (ref 26.0–34.0)
MCHC: 32.5 g/dL (ref 30.0–36.0)
MCV: 92.1 fL (ref 78.0–100.0)
Platelets: 284 10*3/uL (ref 150–400)
RBC: 4.18 MIL/uL — ABNORMAL LOW (ref 4.22–5.81)
RDW: 13 % (ref 11.5–15.5)
WBC: 9.6 10*3/uL (ref 4.0–10.5)

## 2014-11-19 LAB — CBG MONITORING, ED: Glucose-Capillary: 112 mg/dL — ABNORMAL HIGH (ref 65–99)

## 2014-11-19 LAB — I-STAT TROPONIN, ED: TROPONIN I, POC: 0.01 ng/mL (ref 0.00–0.08)

## 2014-11-19 MED ORDER — OXYCODONE-ACETAMINOPHEN 5-325 MG PO TABS
1.0000 | ORAL_TABLET | Freq: Once | ORAL | Status: AC
  Administered 2014-11-19: 1 via ORAL
  Filled 2014-11-19: qty 1

## 2014-11-19 MED ORDER — IOHEXOL 350 MG/ML SOLN
100.0000 mL | Freq: Once | INTRAVENOUS | Status: AC | PRN
  Administered 2014-11-19: 100 mL via INTRAVENOUS

## 2014-11-19 NOTE — ED Provider Notes (Signed)
CSN: [id number]     Arrival date & time 11/19/14  1444 History   First MD Initiated Contact with Patient 11/19/14 2042     Chief Complaint  Patient presents with  . Abdominal Pain  . Shortness of Breath     (Consider location/radiation/quality/duration/timing/severity/associated sxs/prior Treatment) HPI Comments: 54 year old male with a history of type 2 diabetes mellitus who presents with right side pain. The patient states that one week ago, he had a sudden onset of pain in his right side located at his lower right chest. The pain is constant, sharp, and at times severe. He feels like he cannot take a deep breath due to the pain and the pain is worse laying flat. He endorses significant pain with inspiration. His pain does improve after he takes Aleve. He denies any associated cough/cold symptoms, fevers, nausea, or vomiting, or abdominal pain. No urinary symptoms. He denies any central chest pain. Patient also notes a chronic history of intermittent right leg pain and numbness. He feels like his right leg sometimes becomes fatigued after walking for a while. He denies any back pain. No bowel or bladder incontinence.  No personal or family history of blood clots. No recent travel.  Patient is a 54 y.o. male presenting with abdominal pain and shortness of breath. The history is provided by the patient.  Abdominal Pain Associated symptoms: shortness of breath   Shortness of Breath Associated symptoms: abdominal pain     Past Medical History  Diagnosis Date  . Diabetes mellitus without complication    History reviewed. No pertinent past surgical history. History reviewed. No pertinent family history. Social History  Substance Use Topics  . Smoking status: Never Smoker   . Smokeless tobacco: None  . Alcohol Use: No    Review of Systems  Respiratory: Positive for shortness of breath.   Gastrointestinal: Positive for abdominal pain.   10 Systems reviewed and are negative for acute  change except as noted in the HPI.    Allergies  Review of patient's allergies indicates no known allergies.  Home Medications   Prior to Admission medications   Medication Sig Start Date End Date Taking? Authorizing Provider  insulin aspart (NOVOLOG FLEXPEN) 100 UNIT/ML FlexPen Before each meal 3 times a day, 140-199 - 2 units, 200-250 - 4 units, 251-299 - 6 units,  300-349 - 8 units,  350 or above 10 units. Insulin PEN if approved, provide syringes and needles if needed. 10/11/14  Yes Albertine Grates, MD  Insulin Glargine (LANTUS SOLOSTAR) 100 UNIT/ML Solostar Pen Inject 40 Units into the skin daily at 10 pm. 10/11/14  Yes Albertine Grates, MD  naproxen sodium (ANAPROX) 220 MG tablet Take 440 mg by mouth every 12 (twelve) hours as needed (pain).   Yes Historical Provider, MD  aspirin EC 81 MG tablet Take 1 tablet (81 mg total) by mouth daily. Patient not taking: Reported on 11/19/2014 10/11/14   Albertine Grates, MD  omega-3 acid ethyl esters (LOVAZA) 1 G capsule Take 1 capsule (1 g total) by mouth 2 (two) times daily. Patient not taking: Reported on 11/19/2014 10/11/14   Albertine Grates, MD   BP 130/88 mmHg  Pulse 75  Temp(Src) 98.9 F (37.2 C) (Oral)  Resp 20  Ht 6\' 6"  (1.981 m)  Wt 298 lb (135.172 kg)  BMI 34.44 kg/m2  SpO2 97% Physical Exam  Constitutional: He is oriented to person, place, and time. He appears well-developed and well-nourished. No distress.  HENT:  Head: Normocephalic and atraumatic.  Moist mucous membranes  Eyes: Conjunctivae are normal. Pupils are equal, round, and reactive to light.  Neck: Neck supple.  Cardiovascular: Normal rate, regular rhythm and normal heart sounds.   No murmur heard. Pulmonary/Chest: Effort normal and breath sounds normal. No respiratory distress.  Tenderness to palpation of lateral R lower chest wall at costophrenic angle  Abdominal: Soft. Bowel sounds are normal. He exhibits no distension. There is no tenderness.  Musculoskeletal: He exhibits no edema.  Neurological:  He is alert and oriented to person, place, and time.  Fluent speech  Skin: Skin is warm and dry.  Psychiatric: He has a normal mood and affect. Judgment normal.  Nursing note and vitals reviewed.   ED Course  Procedures (including critical care time) Labs Review Labs Reviewed  BASIC METABOLIC PANEL - Abnormal; Notable for the following:    Glucose, Bld 144 (*)    Creatinine, Ser 1.34 (*)    GFR calc non Af Amer 60 (*)    All other components within normal limits  CBC - Abnormal; Notable for the following:    RBC 4.18 (*)    Hemoglobin 12.5 (*)    HCT 38.5 (*)    All other components within normal limits  LIPASE, BLOOD - Abnormal; Notable for the following:    Lipase 57 (*)    All other components within normal limits  HEPATIC FUNCTION PANEL - Abnormal; Notable for the following:    Total Protein 8.4 (*)    ALT 16 (*)    Bilirubin, Direct <0.1 (*)    All other components within normal limits  CBG MONITORING, ED - Abnormal; Notable for the following:    Glucose-Capillary 112 (*)    All other components within normal limits  BRAIN NATRIURETIC PEPTIDE  I-STAT TROPOININ, ED    Imaging Review Dg Chest 2 View  11/19/2014   CLINICAL DATA:  Shortness of breath. Right upper abdominal pain. Symptoms present for 1 week.  EXAM: CHEST  2 VIEW  COMPARISON:  None.  FINDINGS: The cardiac silhouette is within normal limits. Thoracic aorta is mildly tortuous. There are mildly increased interstitial markings bilaterally, most notably in the left greater than right lung bases. No segmental airspace consolidation, overt pulmonary edema, pleural effusion, or pneumothorax is identified. No acute osseous abnormality is seen.  IMPRESSION: Mild bibasilar opacities without prior studies available to assess chronicity. Considerations include acute infectious infiltrate as well as mild chronic interstitial lung disease.   Electronically Signed   By: Sebastian Ache M.D.   On: 11/19/2014 15:58   Ct Angio Chest  Pe W/cm &/or Wo Cm  11/20/2014   CLINICAL DATA:  Right-sided abdominal pain. Difficulty breathing. Pleuritic right lateral chest wall pain. Shortness of breath. Infiltrates on x-ray.  EXAM: CT ANGIOGRAPHY CHEST WITH CONTRAST  TECHNIQUE: Multidetector CT imaging of the chest was performed using the standard protocol during bolus administration of intravenous contrast. Multiplanar CT image reconstructions and MIPs were obtained to evaluate the vascular anatomy.  CONTRAST:  OMNIPAQUE IOHEXOL 350 MG/ML SOLN  COMPARISON:  Chest radiograph 11/19/2014  FINDINGS: Technically adequate study with good opacification of the central and segmental pulmonary arteries. Filling defects are demonstrated in the distal right main pulmonary artery, extending into the right lower lobe and focally and a right upper lobe segmental branch vessels. No definite emboli on the left. Infiltration in the right lung base with small right pleural effusion. This could be due to infarct or pneumonia. Left lung is clear, allowing for motion  artifact. No evidence of right heart strain.  Normal heart size. Normal caliber thoracic aorta. No significant lymphadenopathy in the chest. Visualize lymph nodes are not pathologically enlarged. Esophagus is decompressed.  No pneumothorax. Included portions of the upper abdominal organs are grossly unremarkable. Degenerative changes in the spine. No destructive bone lesions.  Review of the MIP images confirms the above findings.  IMPRESSION: Positive for pulmonary embolus. Emboli demonstrated in the distal right main and right lower lobe segmental branches.  These results were called by telephone at the time of interpretation on 11/20/2014 at 12:16 am to Dr. Frederick Peers , who verbally acknowledged these results.   Electronically Signed   By: Burman Nieves M.D.   On: 11/20/2014 00:19   I have personally reviewed and evaluated these images and lab results as part of my medical decision-making.   EKG  Interpretation   Date/Time:  Saturday November 19 2014 14:54:36 EDT Ventricular Rate:  94 PR Interval:  160 QRS Duration: 84 QT Interval:  341 QTC Calculation: 426 R Axis:   33 Text Interpretation:  Sinus rhythm No significant change since last  tracing Confirmed by Jaevion Goto MD, Akshay Spang [phone]) on 11/19/2014 9:14:19 PM     Medications  oxyCODONE-acetaminophen (PERCOCET/ROXICET) 5-325 MG per tablet 1 tablet (1 tablet Oral Given 11/19/14 2225)  iohexol (OMNIPAQUE) 350 MG/ML injection 100 mL (100 mLs Intravenous Contrast Given 11/19/14 2315)   Lovenox per pharmacy consult MDM   Final diagnoses:  Pulmonary embolism   54 year old male with type 2 diabetes who presents with 1 week of right side pain located in his right lateral lower chest wall which is worse with deep inspiration and laying flat. No fever or other infectious symptoms. Patient well-appearing on exam. He had mild hypertension but O2 sat 100% on room air. EKG without ischemic changes. Obtained above lab work as well as chest x-ray. Chest x-ray showed mild bibasliar opacities, infectious versus interstitial lung disease.   Labwork with stable mild CK D with creatinine of 1.34. Because of the patient's report of pleuritic chest pain as well as his abnormal chest x-ray, obtained a CTA of the chest to evaluate for PE and to also evaluate lung parenchyma.  CT shows pulmonary embolus in distal right main and right lower lobe branches. I discussed results with the patient's. He denies any recent travel, family history of blood clots, or family history of bleeding disorder. He denies melena or hematochezia. I have ordered him a dose of therapeutic Lovenox. No evidence of right heart strain on CT scan and the patient remains stable on room air with no hypotension or hypoxia. Patient admitted to general medicine for further care.  CRITICAL CARE Performed by: Ambrose Finland Shaka Cardin   Total critical care time: 30 minutes  Critical care time  was exclusive of separately billable procedures and treating other patients.  Critical care was necessary to treat or prevent imminent or life-threatening deterioration.  Critical care was time spent personally by me on the following activities: development of treatment plan with patient and/or surrogate as well as nursing, discussions with consultants, evaluation of patient's response to treatment, examination of patient, obtaining history from patient or surrogate, ordering and performing treatments and interventions, ordering and review of laboratory studies, ordering and review of radiographic studies, pulse oximetry and re-evaluation of patient's condition.   Laurence Spates, MD 11/20/14 [id number]

## 2014-11-19 NOTE — ED Notes (Signed)
Two unsuccessful attempts by this Clinical research associate.

## 2014-11-19 NOTE — ED Notes (Addendum)
Pt c/o of right side abd pain, stating its hard to breath due to pain, makes it hard to catch breath. More severe when laying down. Pt denies n/v. Pt also states that pain sometimes runs down right legs as well and he has to sit down. Pt just recently diagnosed with DM x 3 weeks ago

## 2014-11-20 ENCOUNTER — Inpatient Hospital Stay (HOSPITAL_COMMUNITY): Payer: 59

## 2014-11-20 ENCOUNTER — Encounter (HOSPITAL_COMMUNITY): Payer: Self-pay | Admitting: Internal Medicine

## 2014-11-20 DIAGNOSIS — R0602 Shortness of breath: Secondary | ICD-10-CM | POA: Diagnosis present

## 2014-11-20 DIAGNOSIS — N183 Chronic kidney disease, stage 3 unspecified: Secondary | ICD-10-CM

## 2014-11-20 DIAGNOSIS — E119 Type 2 diabetes mellitus without complications: Secondary | ICD-10-CM | POA: Diagnosis present

## 2014-11-20 DIAGNOSIS — N179 Acute kidney failure, unspecified: Secondary | ICD-10-CM | POA: Diagnosis present

## 2014-11-20 DIAGNOSIS — I10 Essential (primary) hypertension: Secondary | ICD-10-CM | POA: Diagnosis present

## 2014-11-20 DIAGNOSIS — Z79899 Other long term (current) drug therapy: Secondary | ICD-10-CM | POA: Diagnosis not present

## 2014-11-20 DIAGNOSIS — E669 Obesity, unspecified: Secondary | ICD-10-CM

## 2014-11-20 DIAGNOSIS — I2699 Other pulmonary embolism without acute cor pulmonale: Secondary | ICD-10-CM | POA: Diagnosis present

## 2014-11-20 DIAGNOSIS — Z794 Long term (current) use of insulin: Secondary | ICD-10-CM | POA: Diagnosis not present

## 2014-11-20 DIAGNOSIS — R0781 Pleurodynia: Secondary | ICD-10-CM | POA: Diagnosis present

## 2014-11-20 DIAGNOSIS — Z8249 Family history of ischemic heart disease and other diseases of the circulatory system: Secondary | ICD-10-CM | POA: Diagnosis not present

## 2014-11-20 DIAGNOSIS — Z833 Family history of diabetes mellitus: Secondary | ICD-10-CM | POA: Diagnosis not present

## 2014-11-20 DIAGNOSIS — D649 Anemia, unspecified: Secondary | ICD-10-CM | POA: Diagnosis present

## 2014-11-20 DIAGNOSIS — Z791 Long term (current) use of non-steroidal anti-inflammatories (NSAID): Secondary | ICD-10-CM | POA: Diagnosis not present

## 2014-11-20 DIAGNOSIS — R590 Localized enlarged lymph nodes: Secondary | ICD-10-CM | POA: Diagnosis present

## 2014-11-20 LAB — CBC
HCT: 37.6 % — ABNORMAL LOW (ref 39.0–52.0)
Hemoglobin: 12.2 g/dL — ABNORMAL LOW (ref 13.0–17.0)
MCH: 30.3 pg (ref 26.0–34.0)
MCHC: 32.4 g/dL (ref 30.0–36.0)
MCV: 93.5 fL (ref 78.0–100.0)
PLATELETS: 226 10*3/uL (ref 150–400)
RBC: 4.02 MIL/uL — ABNORMAL LOW (ref 4.22–5.81)
RDW: 13 % (ref 11.5–15.5)
WBC: 8.4 10*3/uL (ref 4.0–10.5)

## 2014-11-20 LAB — APTT: aPTT: 29 seconds (ref 24–37)

## 2014-11-20 LAB — MAGNESIUM: MAGNESIUM: 1.8 mg/dL (ref 1.7–2.4)

## 2014-11-20 LAB — LIPASE, BLOOD: Lipase: 57 U/L — ABNORMAL HIGH (ref 22–51)

## 2014-11-20 LAB — HEPATIC FUNCTION PANEL
ALBUMIN: 3.5 g/dL (ref 3.5–5.0)
ALK PHOS: 66 U/L (ref 38–126)
ALT: 16 U/L — ABNORMAL LOW (ref 17–63)
AST: 29 U/L (ref 15–41)
BILIRUBIN TOTAL: 0.5 mg/dL (ref 0.3–1.2)
Total Protein: 8.4 g/dL — ABNORMAL HIGH (ref 6.5–8.1)

## 2014-11-20 LAB — COMPREHENSIVE METABOLIC PANEL
ALBUMIN: 3.1 g/dL — AB (ref 3.5–5.0)
ALT: 14 U/L — AB (ref 17–63)
AST: 27 U/L (ref 15–41)
Alkaline Phosphatase: 56 U/L (ref 38–126)
Anion gap: 10 (ref 5–15)
BUN: 12 mg/dL (ref 6–20)
CHLORIDE: 107 mmol/L (ref 101–111)
CO2: 19 mmol/L — ABNORMAL LOW (ref 22–32)
CREATININE: 1.08 mg/dL (ref 0.61–1.24)
Calcium: 8.5 mg/dL — ABNORMAL LOW (ref 8.9–10.3)
GFR calc Af Amer: >60 mL/min (ref >60)
GLUCOSE: 124 mg/dL — AB (ref 65–99)
Potassium: 4 mmol/L (ref 3.5–5.1)
Sodium: 136 mmol/L (ref 135–145)
Total Bilirubin: 0.6 mg/dL (ref 0.3–1.2)
Total Protein: 7.1 g/dL (ref 6.5–8.1)

## 2014-11-20 LAB — GLUCOSE, CAPILLARY
GLUCOSE-CAPILLARY: 104 mg/dL — AB (ref 65–99)
Glucose-Capillary: 120 mg/dL — ABNORMAL HIGH (ref 65–99)

## 2014-11-20 LAB — BRAIN NATRIURETIC PEPTIDE: B NATRIURETIC PEPTIDE 5: 35.3 pg/mL (ref 0.0–100.0)

## 2014-11-20 LAB — PROTIME-INR
INR: 1.12 (ref 0.00–1.49)
PROTHROMBIN TIME: 14.6 s (ref 11.6–15.2)

## 2014-11-20 MED ORDER — ENOXAPARIN SODIUM 150 MG/ML ~~LOC~~ SOLN
135.0000 mg | Freq: Two times a day (BID) | SUBCUTANEOUS | Status: DC
Start: 2014-11-20 — End: 2014-11-20
  Filled 2014-11-20 (×2): qty 1

## 2014-11-20 MED ORDER — DOCUSATE SODIUM 100 MG PO CAPS
100.0000 mg | ORAL_CAPSULE | Freq: Two times a day (BID) | ORAL | Status: DC
  Administered 2014-11-20: 100 mg via ORAL

## 2014-11-20 MED ORDER — INSULIN GLARGINE 100 UNIT/ML ~~LOC~~ SOLN: 40.0000 [IU] | Freq: Every day | SUBCUTANEOUS | Status: DC

## 2014-11-20 MED ORDER — OXYCODONE HCL 5 MG PO TABS: 5.0000 mg | ORAL_TABLET | ORAL | Status: DC | PRN

## 2014-11-20 MED ORDER — RIVAROXABAN 15 MG PO TABS
15.0000 mg | ORAL_TABLET | Freq: Two times a day (BID) | ORAL | Status: DC
  Administered 2014-11-20: 15 mg via ORAL
  Filled 2014-11-20 (×3): qty 1

## 2014-11-20 MED ORDER — PANTOPRAZOLE SODIUM 40 MG PO TBEC
40.0000 mg | DELAYED_RELEASE_TABLET | Freq: Every day | ORAL | Status: DC
  Administered 2014-11-20: 40 mg via ORAL
  Filled 2014-11-20: qty 1

## 2014-11-20 MED ORDER — OXYCODONE-ACETAMINOPHEN 5-325 MG PO TABS: 1.0000 | ORAL_TABLET | Freq: Four times a day (QID) | ORAL | Status: DC | PRN

## 2014-11-20 MED ORDER — DOCUSATE SODIUM 100 MG PO CAPS: 100.0000 mg | ORAL_CAPSULE | Freq: Two times a day (BID) | ORAL | Status: DC

## 2014-11-20 MED ORDER — RIVAROXABAN (XARELTO) VTE STARTER PACK (15 & 20 MG): ORAL_TABLET | ORAL | Status: DC

## 2014-11-20 MED ORDER — OXYCODONE HCL 5 MG PO TABS
10.0000 mg | ORAL_TABLET | ORAL | Status: DC | PRN
  Administered 2014-11-20: 10 mg via ORAL
  Filled 2014-11-20: qty 2

## 2014-11-20 MED ORDER — OXYCODONE HCL 5 MG PO TABS
5.0000 mg | ORAL_TABLET | ORAL | Status: DC | PRN
  Administered 2014-11-20: 10 mg via ORAL
  Filled 2014-11-20: qty 2

## 2014-11-20 MED ORDER — HYDROMORPHONE HCL 1 MG/ML IJ SOLN
1.0000 mg | Freq: Once | INTRAMUSCULAR | Status: AC
  Administered 2014-11-20: 1 mg via INTRAVENOUS
  Filled 2014-11-20: qty 1

## 2014-11-20 MED ORDER — ENOXAPARIN SODIUM 150 MG/ML ~~LOC~~ SOLN
135.0000 mg | SUBCUTANEOUS | Status: AC
Start: 2014-11-20 — End: 2014-11-20
  Administered 2014-11-20: 135 mg via SUBCUTANEOUS
  Filled 2014-11-20: qty 1

## 2014-11-20 MED ORDER — SODIUM CHLORIDE 0.9 % IJ SOLN
3.0000 mL | Freq: Two times a day (BID) | INTRAMUSCULAR | Status: DC
  Administered 2014-11-20 (×2): 3 mL via INTRAVENOUS

## 2014-11-20 MED ORDER — INSULIN ASPART 100 UNIT/ML ~~LOC~~ SOLN: 0.0000 [IU] | Freq: Three times a day (TID) | SUBCUTANEOUS | Status: DC

## 2014-11-20 MED ORDER — RIVAROXABAN 20 MG PO TABS: 20.0000 mg | ORAL_TABLET | Freq: Every day | ORAL | Status: DC

## 2014-11-20 MED ORDER — ONDANSETRON HCL 4 MG/2ML IJ SOLN: 4.0000 mg | Freq: Four times a day (QID) | INTRAMUSCULAR | Status: DC | PRN

## 2014-11-20 MED ORDER — ONDANSETRON HCL 4 MG/2ML IJ SOLN: 4.0000 mg | Freq: Three times a day (TID) | INTRAMUSCULAR | Status: DC | PRN

## 2014-11-20 NOTE — Progress Notes (Signed)
*  Preliminary Results* Bilateral lower extremity venous duplex completed. Bilateral lower extremities are negative for deep vein thrombosis. There is no evidence of Baker's cyst bilaterally.  11/20/2014  Jenella Craigie, RVT, RDCS, RDMS  

## 2014-11-20 NOTE — H&P (Signed)
[facility] History and Physical  Jacob Donaldson ZOX:[id number] DOB: 1960/09/28 DOA: 11/19/2014  Referring physician: Frederick Peers, M.D. PCP: No primary care provider on file.   Chief Complaint: Shortness of breath  HPI: Jacob Donaldson is a 54 y.o. male  with past medical history of diabetes, obesity who comes to the emergency department with a history of sudden right lower chest wall/flank pain associated with deep inspiration and shortness of breath for a week. Patient has been taking over-the-counter Naprosyn, and this seems to relieve the pain. However, the shortness of breath and fatigue has persisted. He states that he has not travel long distance by plane or motor vehicle, but he has a very sedentary job and spends a lot of time sitting during the day. He denies fever, chills, chest pain, dizziness, diaphoresis, but complains of some orthopnea and stated that about a week ago he felt some numbness and discomfort on his right lower extremity.  Workup in the emergency department reveals a pulmonary embolism. He is currently in no acute distress, but states the pleuritic pain is not well controlled at this time.   Review of Systems:  Constitutional:  No weight loss, night sweats, Fevers, chills, fatigue.  HEENT:  No headaches, Difficulty swallowing,Tooth/dental problems,Sore throat,  No sneezing, itching, ear ache, nasal congestion, post nasal drip,  Cardio-vascular:  Positive Orthopnea, positive lower extremity edema about a week ago. No chest pain,  PND,  anasarca, dizziness, palpitations  GI:  Positive constipation and occasional melena (patient describes the stools being of mixed brown and black color). No heartburn, indigestion, abdominal pain, nausea, vomiting, diarrhea, change in bowel habits, loss of appetite  Resp:  Positive pleuritic chest pain, positive shortness of breath with exertion or at rest.  No excess mucus, no productive cough, No non-productive cough, No  coughing up of blood.No change in color of mucus.No wheezing.No chest wall deformity  Skin:  no rash or lesions.  GU:  no dysuria, change in color of urine, no urgency or frequency. No flank pain.  Musculoskeletal: Positive back pain and right lower extremity numbness.  No joint pain or swelling. No decreased range of motion.  Psych:  No change in mood or affect. No depression or anxiety. No memory loss.   Past Medical History  Diagnosis Date  . Diabetes mellitus without complication    History reviewed. No pertinent past surgical history. Social History:  reports that he has never smoked. He does not have any smokeless tobacco history on file. He reports that he does not drink alcohol or use illicit drugs.  No Known Allergies  Family History  Problem Relation Age of Onset  . Heart disease Mother   . Diabetes Mellitus II Maternal Grandmother     Prior to Admission medications   Medication Sig Start Date End Date Taking? Authorizing Provider  insulin aspart (NOVOLOG FLEXPEN) 100 UNIT/ML FlexPen Before each meal 3 times a day, 140-199 - 2 units, 200-250 - 4 units, 251-299 - 6 units,  300-349 - 8 units,  350 or above 10 units. Insulin PEN if approved, provide syringes and needles if needed. 10/11/14  Yes Albertine Grates, MD  Insulin Glargine (LANTUS SOLOSTAR) 100 UNIT/ML Solostar Pen Inject 40 Units into the skin daily at 10 pm. 10/11/14  Yes Albertine Grates, MD  naproxen sodium (ANAPROX) 220 MG tablet Take 440 mg by mouth every 12 (twelve) hours as needed (pain).   Yes Historical Provider, MD  aspirin EC 81 MG tablet Take 1 tablet (81 mg  total) by mouth daily. Patient not taking: Reported on 11/19/2014 10/11/14   Albertine Grates, MD  omega-3 acid ethyl esters (LOVAZA) 1 G capsule Take 1 capsule (1 g total) by mouth 2 (two) times daily. Patient not taking: Reported on 11/19/2014 10/11/14   Albertine Grates, MD   Physical Exam: Filed Vitals:   11/19/14 2258 11/20/14 0018 11/20/14 0036 11/20/14 0130  BP: 130/88  150/92  127/83  Pulse: 75  70 69  Temp:    98.4 F (36.9 C)  TempSrc:    Oral  Resp: 20  22 20   Height:  6\' 6"  (1.981 m)  6\' 6"  (1.981 m)  Weight:  135.172 kg (298 lb)  138.574 kg (305 lb 8 oz)  SpO2: 97%  98% 99%    Wt Readings from Last 3 Encounters:  11/20/14 138.574 kg (305 lb 8 oz)  10/08/14 136.9 kg (301 lb 13 oz)    General:  Appears calm and comfortable Eyes: PERRL, normal lids, irises & conjunctiva ENT: grossly normal hearing, lips & tongue Neck: no LAD, masses or thyromegaly Cardiovascular: RRR, no m/r/g. No LE edema. Telemetry: SR, no arrhythmias  Respiratory: CTA bilaterally, no w/r/r. Normal respiratory effort. Abdomen: Obese, soft, ntnd Skin: no rash or induration seen on limited exam Musculoskeletal: grossly normal tone BUE/BLE Psychiatric: grossly normal mood and affect, speech fluent and appropriate Neurologic: grossly non-focal.          Labs on Admission:  Basic Metabolic Panel:  Recent Labs Lab 11/19/14 1503  NA 140  K 4.0  CL 107  CO2 26  GLUCOSE 144*  BUN 12  CREATININE 1.34*  CALCIUM 9.0   Liver Function Tests:  Recent Labs Lab 11/19/14 2315  AST 29  ALT 16*  ALKPHOS 66  BILITOT 0.5  PROT 8.4*  ALBUMIN 3.5    Recent Labs Lab 11/19/14 2315  LIPASE 57*   CBC:  Recent Labs Lab 11/19/14 1503  WBC 9.6  HGB 12.5*  HCT 38.5*  MCV 92.1  PLT 284    BNP (last 3 results)  Recent Labs  11/19/14 2310  BNP 35.3     CBG:  Recent Labs Lab 11/19/14 2354  GLUCAP 112*    Radiological Exams on Admission: Dg Chest 2 View  11/19/2014   CLINICAL DATA:  Shortness of breath. Right upper abdominal pain. Symptoms present for 1 week.  EXAM: CHEST  2 VIEW  COMPARISON:  None.  FINDINGS: The cardiac silhouette is within normal limits. Thoracic aorta is mildly tortuous. There are mildly increased interstitial markings bilaterally, most notably in the left greater than right lung bases. No segmental airspace consolidation, overt pulmonary  edema, pleural effusion, or pneumothorax is identified. No acute osseous abnormality is seen.  IMPRESSION: Mild bibasilar opacities without prior studies available to assess chronicity. Considerations include acute infectious infiltrate as well as mild chronic interstitial lung disease.   Electronically Signed   By: Sebastian Ache M.D.   On: 11/19/2014 15:58   Ct Angio Chest Pe W/cm &/or Wo Cm  11/20/2014   CLINICAL DATA:  Right-sided abdominal pain. Difficulty breathing. Pleuritic right lateral chest wall pain. Shortness of breath. Infiltrates on x-ray.  EXAM: CT ANGIOGRAPHY CHEST WITH CONTRAST  TECHNIQUE: Multidetector CT imaging of the chest was performed using the standard protocol during bolus administration of intravenous contrast. Multiplanar CT image reconstructions and MIPs were obtained to evaluate the vascular anatomy.  CONTRAST:  OMNIPAQUE IOHEXOL 350 MG/ML SOLN  COMPARISON:  Chest radiograph 11/19/2014  FINDINGS: Technically  adequate study with good opacification of the central and segmental pulmonary arteries. Filling defects are demonstrated in the distal right main pulmonary artery, extending into the right lower lobe and focally and a right upper lobe segmental branch vessels. No definite emboli on the left. Infiltration in the right lung base with small right pleural effusion. This could be due to infarct or pneumonia. Left lung is clear, allowing for motion artifact. No evidence of right heart strain.  Normal heart size. Normal caliber thoracic aorta. No significant lymphadenopathy in the chest. Visualize lymph nodes are not pathologically enlarged. Esophagus is decompressed.  No pneumothorax. Included portions of the upper abdominal organs are grossly unremarkable. Degenerative changes in the spine. No destructive bone lesions.  Review of the MIP images confirms the above findings.  IMPRESSION: Positive for pulmonary embolus. Emboli demonstrated in the distal right main and right lower  lobe segmental branches.  These results were called by telephone at the time of interpretation on 11/20/2014 at 12:16 am to Dr. Frederick Peers , who verbally acknowledged these results.   Electronically Signed   By: Burman Nieves M.D.   On: 11/20/2014 00:19    EKG: Independently reviewed. Vent. rate 94 BPM PR interval 160 ms QRS duration 84 ms QT/QTc 341/426 ms P-R-T axes 54 33 42 Normal sinus rhythm.  Assessment/Plan Principal Problem:   Pulmonary embolism Continue therapeutic dose of Lovenox. Check echocardiogram. Check duplex of lower extremities to look for DVT. I explained to the patient the pros and cons of different oral anticoagulants. He was also told about the need to have anticoagulation for at least 6 months.  Active Problems:   Obesity Advised to have aggressive lifestyle modification.    Diabetes mellitus without complication Continue subcutaneous Lantus. CBG monitoring and regular insulin sliding scale while the patient is in the hospital.     Anemia. Per patient, he thinks he may have had some melena the past few weeks. Check stool for occult blood. The patient was advised to have a colonoscopy at some point in the future.      Code Status: Full code. DVT Prophylaxis: On therapeutic dose Lovenox. Family Communication: Disposition Plan: Admit for anticoagulation with Lovenox and bridging to an oral anticoagulant.  Time spent: Over 60 minutes were spent discussing the case with ED physician, reviewing the chart, examining and interviewing the patient, admitting orders, coordinating care and documentation.Bobette Mo [facility] Pager [phone].

## 2014-11-20 NOTE — Progress Notes (Signed)
SATURATION QUALIFICATIONS: (This note is used to comply with regulatory documentation for home oxygen)  Patient Saturations on Room Air at Rest = 100%  Patient Saturations on Room Air while Ambulating = 100%  Patient Saturations on 0 Liters of oxygen while Ambulating = 100%  Please briefly explain why patient needs home oxygen: 

## 2014-11-20 NOTE — Progress Notes (Signed)
ANTICOAGULATION CONSULT NOTE - Follow Up Consult  Pharmacy Consult for lovenox--> xarelto Indication: pulmonary embolus  No Known Allergies  Patient Measurements: Height:  (198.1 cm) Weight: (!) 305 lb 8 oz (138.574 kg) IBW/kg (Calculated) : 91.4  Vital Signs: Temp: 98.2 F (36.8 C) (09/11 0609) Temp Source: Oral (09/11 0609) BP: 131/96 mmHg (09/11 0609) Pulse Rate: 71 (09/11 0609)  Labs:  Recent Labs  11/19/14 1503 11/20/14 0035 11/20/14 0528  HGB 12.5*  --  12.2*  HCT 38.5*  --  37.6*  PLT 284  --  226  APTT  --  29  --   LABPROT  --  14.6  --   INR  --  1.12  --   CREATININE 1.34*  --  1.08    Estimated Creatinine Clearance: 126.2 mL/min (by C-G formula based on Cr of 1.08).  Assessment: Patient's a 54 y.o M presented to the ED on 9/10 with c/o SOB. Chest CTA showed new PE.  LE doppler negative for DVT.  Lovenox 1 mg/kg q12h started in the ED with first dose given at ~0100 on 9/11.  To transition lovenox to xarelto per MD's request.    Plan:  - d/c lovenox - xarelto  BID x21 days, then  daily (first dose to start at noon today) - monitor for s./s bleeding  Dynasti Kerman P 11/20/2014,9:42 AM

## 2014-11-20 NOTE — Care Management Note (Addendum)
Case Management Note  Patient Details  Name: Pax Reasoner MRN: [id number] Date of Birth: 08/08/1960  Subjective/Objective:        Pulmonary Embolism            Action/Plan: Provided pt with Xarelto 30 day trial card and copay card for $0 copay with insurance if applicable. Pt reports he works 10 hour days and usually sitting most of his shift. He was recently diagnosed with diabetes and is currently on insulin. Plans to be more active.  Contacted [facility] on [address], they have in stock. Made pt aware.   Expected Discharge Date:  11/20/2014               Expected Discharge Plan:  Home/Self Care  In-House Referral:     Discharge planning Services  CM Consult, Medication Assistance   Status of Service:  Completed, signed off  Medicare Important Message Given:    Date Medicare IM Given:    Medicare IM give by:    Date Additional Medicare IM Given:    Additional Medicare Important Message give by:     If discussed at Long Length of Stay Meetings, dates discussed:    Additional Comments:  Elliot Cousin, RN 11/20/2014, 10:58 AM

## 2014-11-20 NOTE — Progress Notes (Signed)
Pt left at this time with his family. Alert, oriented, and without c/o. Discharge instructions/prescriptions given/explained with pt verbalizing understanding. Followup appointments noted. Pt left with glasses and cell phone.

## 2014-11-20 NOTE — Progress Notes (Signed)
ANTICOAGULATION CONSULT NOTE - Initial Consult  Pharmacy Consult for Lovenox Indication: pulmonary embolus  No Known Allergies  Patient Measurements: Height:  (198.1 cm) Weight: 298 lb (135.172 kg) IBW/kg (Calculated) : 91.4   Vital Signs: Temp: 98.9 F (37.2 C) (09/10 1450) Temp Source: Oral (09/10 1450) BP: 130/88 mmHg (09/10 2258) Pulse Rate: 75 (09/10 2258)  Labs:  Recent Labs  11/19/14 1503  HGB 12.5*  HCT 38.5*  PLT 284  CREATININE 1.34*    Estimated Creatinine Clearance: 100.5 mL/min (by C-G formula based on Cr of 1.34).   Medical History: Past Medical History  Diagnosis Date  . Diabetes mellitus without complication     Medications:  Scheduled:  . enoxaparin (LOVENOX) injection  135 mg Subcutaneous STAT   Infusions:    Assessment:  54 yr male with abdominal pain, difficulty breathing  CTAngio shows + Pulmonary Embolism  Pharmacy consulted to dose Lovenox for PE treatment  On no oral anticoagulation PTA  Goal of Therapy:  Monitor platelets by anticoagulation protocol: Yes   Plan:   Obtain baseline aPTT, PT/INR  Lovenox  sq q12h  Follow CBC while one Lovenox  Maple Odaniel, Joselyn Glassman, PharmD 11/20/2014,12:35 AM

## 2014-11-20 NOTE — Discharge Summary (Addendum)
Physician Discharge Summary  Jacob Donaldson:[id number] DOB: May 17, 1960 DOA: 11/19/2014  PCP: No primary care provider on file.- he states he has a provider in another city-considering getting a new provider  Admit date: 11/19/2014 Discharge date: 11/20/2014  Time spent: 60 minutes  Recommendations for Outpatient Follow-up:  1. Follow-up with PCP in 2 weeks- have explained that he will need refills on Xarelto & follow-up of medical issues and needs to make sure he has a PCP  Discharge Condition: Stable Diet recommendation: Modified heart healthy   Discharge Diagnoses:  Principal Problem:   Pulmonary embolism Active Problems:   Obesity   Diabetes mellitus without complication  acute renal failure Large left groin lymph node- see below  History of present illness:  54 year old male with recently diagnosed insulin-requiring diabetes mellitus and obesity who presents to the hospital for right lower chest/upper abdominal pain which is worse when he takes a deep breath. He is also been short of breath when ambulating. He is been taking Aleve for his pain. No complaints of cough. In the ER a CT scan revealed right-sided pulmonary emboli-see CT report below. Please see history of present illness for further details  Hospital Course:  Pulmonary emboli with pleuritic chest pain -Right-sided-ultrasound of bilateral lower extremities done as he has a relatively sedentary job- negative for DVT -Signs of pulmonary strain on CT-no left-sided pain or heaviness with ambulating-troponin negative-no hypotension -Started on Lovenox and transition to a Xarelto starter pack today- reminded to avoid daily use of NSAIDs while on Xarelto -Oxygen level checked at rest and with ambulation and is found to be greater than 100% and therefore he has no need for home O2 -Given a prescription for oxycodone to help control pain -Can consider hypercoagulable workup once acute PE has resolved  Left groin lymph  node -Large lymph node in left groin seen on vascular duplex-see report below  Melena - Per history of present illness there was a question of whether the patient may have had melanoma few weeks ago-I have questioned him specifically about this-she has had no black stools or bloody stools-no history of peptic ulcer disease  Acute renal failure -Creatinine 1.3 on arrival- improved to 1.08 today  Insulin requiring diabetes mellitus -Relatively new diagnosis per patient-continue insulin per PCP  Anemia -Workup per PCP  Procedures:  Venous duplex bilateral lower extremities- - No evidence of deep vein thrombosis involving the right lower extremity and left lower extremity. - No evidence of Baker&'s cyst on the right or left. - There is evidence of a heterogenous area of the left groin measuring 3.86cm, suggestive of an enlarged left inguinal lymph node.  Discharge Exam: Filed Weights   11/20/14 0018 11/20/14 0130  Weight: 135.172 kg (298 lb) 138.574 kg (305 lb 8 oz)   Filed Vitals:   11/20/14 0609  BP: 131/96  Pulse: 71  Temp: 98.2 F (36.8 C)  Resp: 20    General: AAO x 3, no distress Cardiovascular: RRR, no murmurs  Respiratory: clear to auscultation bilaterally GI: soft, non-tender, non-distended, bowel sound positive  Discharge Instructions You were cared for by a hospitalist during your hospital stay. If you have any questions about your discharge medications or the care you received while you were in the hospital after you are discharged, you can call the unit and asked to speak with the hospitalist on call if the hospitalist that took care of you is not available. Once you are discharged, your primary care physician will handle any further medical  issues. Please note that NO REFILLS for any discharge medications will be authorized once you are discharged, as it is imperative that you return to your primary care physician (or establish a relationship with a primary  care physician if you do not have one) for your aftercare needs so that they can reassess your need for medications and monitor your lab values.     Medication List    STOP taking these medications        naproxen sodium 220 MG tablet  Commonly known as:  ANAPROX      TAKE these medications        docusate sodium 100 MG capsule  Commonly known as:  COLACE  Take 1 capsule (100 mg total) by mouth 2 (two) times daily. Use to prevent constipation while on narcotics     insulin aspart 100 UNIT/ML FlexPen  Commonly known as:  NOVOLOG FLEXPEN  Before each meal 3 times a day, 140-199 - 2 units, 200-250 - 4 units, 251-299 - 6 units,  300-349 - 8 units,  350 or above 10 units. Insulin PEN if approved, provide syringes and needles if needed.     Insulin Glargine 100 UNIT/ML Solostar Pen  Commonly known as:  LANTUS SOLOSTAR  Inject 40 Units into the skin daily at 10 pm.     oxyCODONE 5 MG immediate release tablet  Commonly known as:  Oxy IR/ROXICODONE  Take 1-2 tablets (5-10 mg total) by mouth every 4 (four) hours as needed for moderate pain or severe pain.     Rivaroxaban 15 & 20 MG Tbpk  Commonly known as:  XARELTO STARTER PACK  Take as directed on package: Start with one  tablet by mouth twice a day with food. On Day 22, switch to one  tablet once a day with food.       No Known Allergies Follow-up Information    Follow up In 2 weeks.   Why:  see PCP       The results of significant diagnostics from this hospitalization (including imaging, microbiology, ancillary and laboratory) are listed below for reference.    Significant Diagnostic Studies: Dg Chest 2 View  11/19/2014   CLINICAL DATA:  Shortness of breath. Right upper abdominal pain. Symptoms present for 1 week.  EXAM: CHEST  2 VIEW  COMPARISON:  None.  FINDINGS: The cardiac silhouette is within normal limits. Thoracic aorta is mildly tortuous. There are mildly increased interstitial markings bilaterally, most  notably in the left greater than right lung bases. No segmental airspace consolidation, overt pulmonary edema, pleural effusion, or pneumothorax is identified. No acute osseous abnormality is seen.  IMPRESSION: Mild bibasilar opacities without prior studies available to assess chronicity. Considerations include acute infectious infiltrate as well as mild chronic interstitial lung disease.   Electronically Signed   By: Sebastian Ache M.D.   On: 11/19/2014 15:58   Ct Angio Chest Pe W/cm &/or Wo Cm  11/20/2014   CLINICAL DATA:  Right-sided abdominal pain. Difficulty breathing. Pleuritic right lateral chest wall pain. Shortness of breath. Infiltrates on x-ray.  EXAM: CT ANGIOGRAPHY CHEST WITH CONTRAST  TECHNIQUE: Multidetector CT imaging of the chest was performed using the standard protocol during bolus administration of intravenous contrast. Multiplanar CT image reconstructions and MIPs were obtained to evaluate the vascular anatomy.  CONTRAST:  OMNIPAQUE IOHEXOL 350 MG/ML SOLN  COMPARISON:  Chest radiograph 11/19/2014  FINDINGS: Technically adequate study with good opacification of the central and segmental pulmonary arteries. Filling defects are  demonstrated in the distal right main pulmonary artery, extending into the right lower lobe and focally and a right upper lobe segmental branch vessels. No definite emboli on the left. Infiltration in the right lung base with small right pleural effusion. This could be due to infarct or pneumonia. Left lung is clear, allowing for motion artifact. No evidence of right heart strain.  Normal heart size. Normal caliber thoracic aorta. No significant lymphadenopathy in the chest. Visualize lymph nodes are not pathologically enlarged. Esophagus is decompressed.  No pneumothorax. Included portions of the upper abdominal organs are grossly unremarkable. Degenerative changes in the spine. No destructive bone lesions.  Review of the MIP images confirms the above findings.   IMPRESSION: Positive for pulmonary embolus. Emboli demonstrated in the distal right main and right lower lobe segmental branches.  These results were called by telephone at the time of interpretation on 11/20/2014 at 12:16 am to Dr. Frederick Peers , who verbally acknowledged these results.   Electronically Signed   By: Burman Nieves M.D.   On: 11/20/2014 00:19    Microbiology: No results found for this or any previous visit (from the past 240 hour(s)).   Labs: Basic Metabolic Panel:  Recent Labs Lab 11/19/14 1503 11/20/14 0528  NA 140 136  K 4.0 4.0  CL 107 107  CO2 26 19*  GLUCOSE 144* 124*  BUN 12 12  CREATININE 1.34* 1.08  CALCIUM 9.0 8.5*  MG  --  1.8   Liver Function Tests:  Recent Labs Lab 11/19/14 2315 11/20/14 0528  AST 29 27  ALT 16* 14*  ALKPHOS 66 56  BILITOT 0.5 0.6  PROT 8.4* 7.1  ALBUMIN 3.5 3.1*    Recent Labs Lab 11/19/14 2315  LIPASE 57*   No results for input(s): AMMONIA in the last 168 hours. CBC:  Recent Labs Lab 11/19/14 1503 11/20/14 0528  WBC 9.6 8.4  HGB 12.5* 12.2*  HCT 38.5* 37.6*  MCV 92.1 93.5  PLT 284 226   Cardiac Enzymes: No results for input(s): CKTOTAL, CKMB, CKMBINDEX, TROPONINI in the last 168 hours. BNP: BNP (last 3 results)  Recent Labs  11/19/14 2310  BNP 35.3    ProBNP (last 3 results) No results for input(s): PROBNP in the last 8760 hours.  CBG:  Recent Labs Lab 11/19/14 2354 11/20/14 0730 11/20/14 1145  GLUCAP 112* 104* 120*       SignedCalvert Cantor, MD [facility] 11/20/2014, 12:32 PM

## 2014-11-22 ENCOUNTER — Ambulatory Visit: Payer: 59 | Admitting: *Deleted

## 2014-11-23 NOTE — Progress Notes (Signed)
Utilization review completed.  

## 2014-12-01 ENCOUNTER — Ambulatory Visit: Payer: 59 | Admitting: *Deleted

## 2014-12-03 ENCOUNTER — Emergency Department (HOSPITAL_COMMUNITY)
Admission: EM | Admit: 2014-12-03 | Discharge: 2014-12-03 | Disposition: A | Discharge status: Home / Self Care | Payer: 59 | Attending: Emergency Medicine | Admitting: Emergency Medicine

## 2014-12-03 ENCOUNTER — Encounter (HOSPITAL_COMMUNITY): Payer: Self-pay

## 2014-12-03 ENCOUNTER — Emergency Department (HOSPITAL_COMMUNITY): Payer: 59

## 2014-12-03 DIAGNOSIS — R0602 Shortness of breath: Secondary | ICD-10-CM | POA: Diagnosis present

## 2014-12-03 DIAGNOSIS — R1011 Right upper quadrant pain: Secondary | ICD-10-CM | POA: Insufficient documentation

## 2014-12-03 DIAGNOSIS — Z794 Long term (current) use of insulin: Secondary | ICD-10-CM | POA: Insufficient documentation

## 2014-12-03 DIAGNOSIS — E119 Type 2 diabetes mellitus without complications: Secondary | ICD-10-CM | POA: Insufficient documentation

## 2014-12-03 DIAGNOSIS — R1013 Epigastric pain: Secondary | ICD-10-CM | POA: Insufficient documentation

## 2014-12-03 DIAGNOSIS — R0789 Other chest pain: Secondary | ICD-10-CM | POA: Insufficient documentation

## 2014-12-03 DIAGNOSIS — I2699 Other pulmonary embolism without acute cor pulmonale: Secondary | ICD-10-CM | POA: Diagnosis not present

## 2014-12-03 DIAGNOSIS — R1012 Left upper quadrant pain: Secondary | ICD-10-CM | POA: Insufficient documentation

## 2014-12-03 DIAGNOSIS — F329 Major depressive disorder, single episode, unspecified: Secondary | ICD-10-CM | POA: Diagnosis not present

## 2014-12-03 DIAGNOSIS — R0781 Pleurodynia: Secondary | ICD-10-CM

## 2014-12-03 HISTORY — DX: Other pulmonary embolism without acute cor pulmonale: I26.99

## 2014-12-03 NOTE — ED Notes (Signed)
Pt c/o pleural pain rated 5/10 on deep inspiration and occasionally with spasms.  Reports coughing up dark/black blood flecks since PE dx two weeks ago.  Currently taking Xarelto for treatment of PE.

## 2014-12-03 NOTE — ED Notes (Signed)
He states he was dx with P.E. Last week with symptomology of mild shortness of breath with post. Right lung field area discomfort.  He is here today with increased feeling of "breathlessness" and is now having some left lat. Thoracic area pain.   EKG performed at triage and shown to Dr. Elise Benne.  His skin is normal, warm and dry.  He expresses frustration with his recent "lack of energy"; and also he is a recently dx diabetic.

## 2014-12-03 NOTE — ED Provider Notes (Signed)
CSN: [id number]     Arrival date & time 12/03/14  1723 History   First MD Initiated Contact with Patient 12/03/14 1956     Chief Complaint  Patient presents with  . Shortness of Breath     (Consider location/radiation/quality/duration/timing/severity/associated sxs/prior Treatment) HPI Jacob Donaldson is a 54 y.o. male with PMH significant for DM and PE (11/19/14) who presents with persistent SOB and right sided pleuritic chest pain that has not resolved since his d/c.  Pt was d/c home on Xarelto and PCP follow up in 2 weeks.  Bilateral LLE doppler showed no evidence of DVT.  Since his d/c he has developed a new cough with occasional hemoptysis (dark red, clumpy, dime sized), and palpitations (1-2x/day lasting seconds).  Associated symptoms include dizziness, upper abdominal pain, lethargy, fatigue, and night sweats.  Denies syncope, LOC, fevers, chills, N/V/D/C, hematochezia, melena, or urinary symptoms.   Past Medical History  Diagnosis Date  . Diabetes mellitus without complication   . Pulmonary embolism    No past surgical history on file. Family History  Problem Relation Age of Onset  . Heart disease Mother   . Diabetes Mellitus II Maternal Grandmother    Social History  Substance Use Topics  . Smoking status: Never Smoker   . Smokeless tobacco: None  . Alcohol Use: No    Review of Systems All other systems negative unless otherwise stated in HPI    Allergies  Review of patient's allergies indicates no known allergies.  Home Medications   Prior to Admission medications   Medication Sig Start Date End Date Taking? Authorizing Veronika Heard  docusate sodium (COLACE) 100 MG capsule Take 1 capsule (100 mg total) by mouth 2 (two) times daily. Use to prevent constipation while on narcotics 11/20/14  Yes Calvert Cantor, MD  insulin aspart (NOVOLOG FLEXPEN) 100 UNIT/ML FlexPen Before each meal 3 times a day, 140-199 - 2 units, 200-250 - 4 units, 251-299 - 6 units,  300-349 - 8 units,  350  or above 10 units. Insulin PEN if approved, provide syringes and needles if needed. 10/11/14  Yes Albertine Grates, MD  Insulin Glargine (LANTUS SOLOSTAR) 100 UNIT/ML Solostar Pen Inject 40 Units into the skin daily at 10 pm. Patient taking differently: Inject 20 Units into the skin 2 (two) times daily.  10/11/14  Yes Albertine Grates, MD  oxyCODONE (OXY IR/ROXICODONE) 5 MG immediate release tablet Take 1-2 tablets (5-10 mg total) by mouth every 4 (four) hours as needed for moderate pain or severe pain. 11/20/14  Yes Calvert Cantor, MD  Rivaroxaban (XARELTO STARTER PACK) 15 & 20 MG TBPK Take as directed on package: Start with one  tablet by mouth twice a day with food. On Day 22, switch to one  tablet once a day with food. 11/20/14  Yes Saima Rizwan, MD   BP 120/90 mmHg  Pulse 88  Temp(Src) 98.3 F (36.8 C) (Oral)  Resp 19  SpO2 97% Physical Exam  Constitutional: He is oriented to person, place, and time. He appears well-developed and well-nourished.  HENT:  Head: Normocephalic and atraumatic.  Mouth/Throat: Oropharynx is clear and moist.  Neck: Normal range of motion. Neck supple.  Cardiovascular: Normal rate, regular rhythm, normal heart sounds and intact distal pulses.   No murmur heard. Pulmonary/Chest: Effort normal. No accessory muscle usage. No respiratory distress. He has decreased breath sounds. He has no wheezes. He has no rales.  Abdominal: Soft. Bowel sounds are normal. He exhibits no distension. There is tenderness in the right  upper quadrant, epigastric area and left upper quadrant. There is no rigidity, no rebound and no guarding.  Musculoskeletal: Normal range of motion.  Lymphadenopathy:    He has no cervical adenopathy.  Neurological: He is alert and oriented to person, place, and time.  Skin: Skin is warm and dry.  Psychiatric: His speech is normal and behavior is normal. He exhibits a depressed mood. He expresses no suicidal ideation. He expresses no suicidal plans and no homicidal  plans.    ED Course  Procedures (including critical care time) Labs Review Labs Reviewed - No data to display  Imaging Review Dg Chest 2 View  12/03/2014   CLINICAL DATA:  54 year old male complaining of chest pain radiating into the right flank and back for the past 2 weeks.  EXAM: CHEST  2 VIEW  COMPARISON:  Chest x-ray 11/19/2014.  FINDINGS: Ill-defined opacity in the right lower lobe suspicious for residual consolidation. Left lung is clear. Small right pleural effusion. No left pleural effusion. No evidence of pulmonary edema. Heart size and mediastinal contours are within normal limits.  IMPRESSION: 1. Residual airspace consolidation in the right lower lobe may relate to recently diagnosed pulmonary embolism (see CT scan 11/19/2014). 2. Small right pleural effusion.   Electronically Signed   By: Trudie Reed M.D.   On: 12/03/2014 20:54   I have personally reviewed and evaluated these images and lab results as part of my medical decision-making.   EKG Interpretation   Date/Time:  Saturday December 03 2014 18:06:51 EDT Ventricular Rate:  89 PR Interval:  160 QRS Duration: 79 QT Interval:  350 QTC Calculation: 426 R Axis:   13 Text Interpretation:  Sinus rhythm No significant change since last  tracing Confirmed by PICKERING  MD, Harrold Donath [phone]) on 12/03/2014 7:24:15 PM      MDM   Final diagnoses:  Pulmonary embolism  Pleuritic chest pain  Shortness of breath    Pt presents s/p pulmonary embolism (11/19/14) with persistent SOB, right sided pleuritic chest pain.  New onset cough, hemoptysis, and palpitations.  No syncopal episodes.  VSS, nonhypoxic, 97% on RA.  On exam, decreased breath sounds b/l.  Heart RRR.  Intact distal pulses.  Concern for pulmonary infarct.  Will ambulate with pulse oximetry and pulse rate.  CXR and EKG.  Pt ambulated without complication.  HR 88, 97% oxygen saturation.  CXR shows residual airspace consolidation in RLL related to recent PE along with  small right pleural effusion.   Likely related to recent pulmonary embolism.  No evidence of pulmonary infarct.  Discussed return precautions including syncope, dyspnea of exertion, increasing shortness of breath.  Follow up with PCP in 1 week.  Patient agrees with the above plan for discharge.  Blood pressure 120/90, pulse 88, temperature 98.3 F (36.8 C), temperature source Oral, resp. rate 19, SpO2 97 %.     Cheri Fowler, PA-C 12/03/14 2132  Derwood Kaplan, MD 12/04/14 [phone]

## 2014-12-03 NOTE — Discharge Instructions (Signed)
Pulmonary Embolism A pulmonary (lung) embolism (PE) is a blood clot that has traveled to the lung and results in a blockage of blood flow in the affected lung. Most clots come from deep veins in the legs or pelvis. PE is a dangerous and potentially life-threatening condition that can be treated if identified. CAUSES Blood clots form in a vein for different reasons. Usually several things cause blood clots. They include:  The flow of blood slows down.  The inside of the vein is damaged in some way.  The person has a condition that makes the blood clot more easily. RISK FACTORS Some people are more likely than others to develop PE. Risk factors include:   Smoking.  Being overweight (obese).  Sitting or lying still for a long time. This includes long-distance travel, paralysis, or recovery from an illness or surgery. Other factors that increase risk are:   Older age, especially over 75 years of age.  Having a family history of blood clots or if you have already had a blood clot.  Having major or lengthy surgery. This is especially true for surgery on the hip, knee, or belly (abdomen). Hip surgery is particularly high risk.  Having a long, thin tube (catheter) placed inside a vein during a medical procedure.  Breaking a hip or leg.  Having cancer or cancer treatment.  Medicines containing the male hormone estrogen. This includes birth control pills and hormone replacement therapy.  Other circulation or heart problems.  Pregnancy and childbirth.  Hormone changes make the blood clot more easily during pregnancy.  The fetus puts pressure on the veins of the pelvis.  There is a risk of injury to veins during delivery or a caesarean delivery. The risk is highest just after childbirth.  PREVENTION   Exercise the legs regularly. Take a brisk 30 minute walk every day.  Maintain a weight that is appropriate for your height.  Avoid sitting or lying in bed for long periods of  time without moving your legs.  Women, particularly those over the age of 35 years, should consider the risks and benefits of taking estrogen medicines, including birth control pills.  Do not smoke, especially if you take estrogen medicines.  Long-distance travel can increase your risk. You should exercise your legs by walking or pumping the muscles every hour.  Many of the risk factors above relate to situations that exist with hospitalization, either for illness, injury, or elective surgery. Prevention may include medical and nonmedical measures.   Your health care provider will assess you for the need for venous thromboembolism prevention when you are admitted to the hospital. If you are having surgery, your surgeon will assess you the day of or day after surgery.  SYMPTOMS  The symptoms of a PE usually start suddenly and include:  Shortness of breath.  Coughing.  Coughing up blood or blood-tinged mucus.  Chest pain. Pain is often worse with deep breaths.  Rapid heartbeat. DIAGNOSIS  If a PE is suspected, your health care provider will take a medical history and perform a physical exam. Other tests that may be required include:  Blood tests, such as studies of the clotting properties of your blood.  Imaging tests, such as ultrasound, CT, MRI, and other tests to see if you have clots in your legs or lungs.  An electrocardiogram. This can look for heart strain from blood clots in the lungs. TREATMENT   The most common treatment for a PE is blood thinning (anticoagulant) medicine, which reduces   the blood's tendency to clot. Anticoagulants can stop new blood clots from forming and old clots from growing. They cannot dissolve existing clots. Your body does this by itself over time. Anticoagulants can be given by mouth, through an intravenous (IV) tube, or by injection. Your health care provider will determine the best program for you.  Less commonly, clot-dissolving medicines  (thrombolytics) are used to dissolve a PE. They carry a high risk of bleeding, so they are used mainly in severe cases.  Very rarely, a blood clot in the leg needs to be removed surgically.  If you are unable to take anticoagulants, your health care provider may arrange for you to have a filter placed in a main vein in your abdomen. This filter prevents clots from traveling to your lungs. HOME CARE INSTRUCTIONS   Take all medicines as directed by your health care provider.  Learn as much as you can about DVT.  Wear a medical alert bracelet or carry a medical alert card.  Ask your health care provider how soon you can go back to normal activities. It is important to stay active to prevent blood clots. If you are on anticoagulant medicine, avoid contact sports.  It is very important to exercise. This is especially important while traveling, sitting, or standing for long periods of time. Exercise your legs by walking or by tightening and relaxing your leg muscles regularly. Take frequent walks.  You may need to wear compression stockings. These are tight elastic stockings that apply pressure to the lower legs. This pressure can help keep the blood in the legs from clotting. Taking Warfarin Warfarin is a daily medicine that is taken by mouth. Your health care provider will advise you on the length of treatment (usually 3-6 months, sometimes lifelong). If you take warfarin:  Understand how to take warfarin and foods that can affect how warfarin works in your body.  Too much and too little warfarin are both dangerous. Too much warfarin increases the risk of bleeding. Too little warfarin continues to allow the risk for blood clots. Warfarin and Regular Blood Testing While taking warfarin, you will need to have regular blood tests to measure your blood clotting time. These blood tests usually include both the prothrombin time (PT) and international normalized ratio (INR) tests. The PT and INR  results allow your health care provider to adjust your dose of warfarin. It is very important that you have your PT and INR tested as often as directed by your health care provider.  Warfarin and Your Diet Avoid major changes in your diet, or notify your health care provider before changing your diet. Arrange a visit with a registered dietitian to answer your questions. Many foods, especially foods high in vitamin K, can interfere with warfarin and affect the PT and INR results. You should eat a consistent amount of foods high in vitamin K. Foods high in vitamin K include:   Spinach, kale, broccoli, cabbage, collard and turnip greens, Brussels sprouts, peas, cauliflower, seaweed, and parsley.  Beef and pork liver.  Green tea.  Soybean oil. Warfarin with Other Medicines Many medicines can interfere with warfarin and affect the PT and INR results. You must:  Tell your health care provider about any and all medicines, vitamins, and supplements you take, including aspirin and other over-the-counter anti-inflammatory medicines. Be especially cautious with aspirin and anti-inflammatory medicines. Ask your health care provider before taking these.  Do not take or discontinue any prescribed or over-the-counter medicine except on the advice   of your health care provider or pharmacist. Warfarin Side Effects Warfarin can have side effects, such as easy bruising and difficulty stopping bleeding. Ask your health care provider or pharmacist about other side effects of warfarin. You will need to:  Hold pressure over cuts for longer than usual.  Notify your dentist and other health care providers that you are taking warfarin before you undergo any procedures where bleeding may occur. Warfarin with Alcohol and Tobacco   Drinking alcohol frequently can increase the effect of warfarin, leading to excess bleeding. It is best to avoid alcoholic drinks or consume only very small amounts while taking warfarin.  Notify your health care provider if you change your alcohol intake.  Do not use any tobacco products including cigarettes, chewing tobacco, or electronic cigarettes. If you smoke, quit. Ask your health care provider for help with quitting smoking. Alternative Medicines to Warfarin: Factor Xa Inhibitor Medicines  These blood thinning medicines are taken by mouth, usually for several weeks or longer. It is important to take the medicine every single day, at the same time each day.  There are no regular blood tests required when using these medicines.  There are fewer food and drug interactions than with warfarin.  The side effects of this class of medicine is similar to that of warfarin, including excessive bruising or bleeding. Ask your health care provider or pharmacist about other potential side effects. SEEK MEDICAL CARE IF:   You notice a rapid heartbeat.  You feel weaker or more tired than usual.  You feel faint.  You notice increased bruising.  Your symptoms are not getting better in the time expected.  You are having side effects of medicine. SEEK IMMEDIATE MEDICAL CARE IF:   You have chest pain.  You have trouble breathing.  You have new or increased swelling or pain in one leg.  You cough up blood.  You notice blood in vomit, in a bowel movement, or in urine.  You have a fever. Symptoms of PE may represent a serious problem that is an emergency. Do not wait to see if the symptoms will go away. Get medical help right away. Call your local emergency services (911 in the [place]). Do not drive yourself to the hospital. Document Released: 02/23/2000 Document Revised: 07/12/2013 Document Reviewed: 03/08/2013 [employer] Patient Information 2015 [employer]. This information is not intended to replace advice given to you by your health care provider. Make sure you discuss any questions you have with your health care provider.  

## 2014-12-15 ENCOUNTER — Emergency Department (HOSPITAL_BASED_OUTPATIENT_CLINIC_OR_DEPARTMENT_OTHER)
Admission: EM | Admit: 2014-12-15 | Discharge: 2014-12-15 | Disposition: A | Discharge status: Home / Self Care | Payer: 59 | Attending: Emergency Medicine | Admitting: Emergency Medicine

## 2014-12-15 ENCOUNTER — Encounter (HOSPITAL_BASED_OUTPATIENT_CLINIC_OR_DEPARTMENT_OTHER): Payer: Self-pay | Admitting: *Deleted

## 2014-12-15 ENCOUNTER — Emergency Department (HOSPITAL_BASED_OUTPATIENT_CLINIC_OR_DEPARTMENT_OTHER): Payer: 59

## 2014-12-15 DIAGNOSIS — R079 Chest pain, unspecified: Secondary | ICD-10-CM | POA: Insufficient documentation

## 2014-12-15 DIAGNOSIS — Z794 Long term (current) use of insulin: Secondary | ICD-10-CM | POA: Diagnosis not present

## 2014-12-15 DIAGNOSIS — R05 Cough: Secondary | ICD-10-CM | POA: Diagnosis present

## 2014-12-15 DIAGNOSIS — I2782 Chronic pulmonary embolism: Secondary | ICD-10-CM | POA: Diagnosis not present

## 2014-12-15 DIAGNOSIS — E119 Type 2 diabetes mellitus without complications: Secondary | ICD-10-CM | POA: Insufficient documentation

## 2014-12-15 LAB — BASIC METABOLIC PANEL
ANION GAP: 4 — AB (ref 5–15)
BUN: 12 mg/dL (ref 6–20)
CALCIUM: 8.6 mg/dL — AB (ref 8.9–10.3)
CO2: 26 mmol/L (ref 22–32)
Chloride: 106 mmol/L (ref 101–111)
Creatinine, Ser: 1.22 mg/dL (ref 0.61–1.24)
GFR calc Af Amer: >60 mL/min (ref >60)
Glucose, Bld: 103 mg/dL — ABNORMAL HIGH (ref 65–99)
POTASSIUM: 3.6 mmol/L (ref 3.5–5.1)
SODIUM: 136 mmol/L (ref 135–145)

## 2014-12-15 LAB — CBC WITH DIFFERENTIAL/PLATELET
BASOS ABS: 0 10*3/uL (ref 0.0–0.1)
BASOS PCT: 0 %
EOS PCT: 2 %
Eosinophils Absolute: 0.2 10*3/uL (ref 0.0–0.7)
HEMATOCRIT: 39.7 % (ref 39.0–52.0)
Hemoglobin: 12.7 g/dL — ABNORMAL LOW (ref 13.0–17.0)
LYMPHS PCT: 30 %
Lymphs Abs: 1.9 10*3/uL (ref 0.7–4.0)
MCH: 29.3 pg (ref 26.0–34.0)
MCHC: 32 g/dL (ref 30.0–36.0)
MCV: 91.5 fL (ref 78.0–100.0)
MONO ABS: 0.3 10*3/uL (ref 0.1–1.0)
Monocytes Relative: 5 %
NEUTROS ABS: 3.9 10*3/uL (ref 1.7–7.7)
Neutrophils Relative %: 63 %
PLATELETS: 231 10*3/uL (ref 150–400)
RBC: 4.34 MIL/uL (ref 4.22–5.81)
RDW: 13.7 % (ref 11.5–15.5)
WBC: 6.4 10*3/uL (ref 4.0–10.5)

## 2014-12-15 MED ORDER — IOHEXOL 350 MG/ML SOLN
100.0000 mL | Freq: Once | INTRAVENOUS | Status: AC | PRN
  Administered 2014-12-15: 100 mL via INTRAVENOUS

## 2014-12-15 MED ORDER — BENZONATATE 100 MG PO CAPS: 100.0000 mg | ORAL_CAPSULE | Freq: Three times a day (TID) | ORAL | Status: DC

## 2014-12-15 MED ORDER — SODIUM CHLORIDE 0.9 % IV BOLUS (SEPSIS)
1000.0000 mL | Freq: Once | INTRAVENOUS | Status: AC
  Administered 2014-12-15: 1000 mL via INTRAVENOUS

## 2014-12-15 MED ORDER — AZITHROMYCIN 250 MG PO TABS: 250.0000 mg | ORAL_TABLET | Freq: Every day | ORAL | Status: DC

## 2014-12-15 NOTE — ED Notes (Signed)
Dr. Belfi at bedside 

## 2014-12-15 NOTE — Discharge Instructions (Signed)
Venous Thromboembolism °Venous thromboembolism (VTE) is a condition in which a blood clot (thrombus) develops in the body. A thrombus usually occurs in a deep vein in the leg or the pelvis, but it can also occur in the arm. Sometimes, pieces of a thrombus can break off from its original place of development and travel through the bloodstream to other parts of the body. When that happens, the thrombus is called an embolism. An embolism can block the blood flow in the blood vessels of other organs. °There are two serious types of VTE: °· Deep vein thrombosis (DVT). A DVT is a thrombus that usually occurs in a deep, larger vein of the lower leg or the pelvis, or in an upper extremity such as the arm. °· Pulmonary embolism (PE). A PE occurs when an embolism has formed and traveled to the lungs. A PE can block or decrease the blood flow in one lung or both lungs. °VTE is a serious health condition that can cause disability or death. It is very important to get help right away and to not ignore symptoms. °CAUSES °VTE is caused by the formation of a blood clot in your leg, pelvis, or arm. Usually, several things contribute to the formation of blood clots. A clot may develop when: °· Your blood flow slows down. °· Your vein becomes damaged in some way. °· You have a condition that makes your blood clot more easily. °RISK FACTORS °A VTE is more likely to develop in: °· People who are older, especially over 60 years of age. °· People who are overweight (obese). °· People who sit or lie still for a long time, such as during long-distance travel (over 4 hours), bed rest, hospitalization, or during recovery from certain medical conditions like a stroke. °· People who do not engage in much physical activity (sedentary lifestyle). °· People who have chronic breathing disorders. °· People who have a personal or family history of blood clots or blood clotting disease. °· People who have peripheral vascular disease (PVD), diabetes,  or some types of cancer. °· People who have heart disease, especially if the person had a recent heart attack or has congestive heart failure. °· People who have neurological diseases that affect the legs (leg paresis). °· People who have had a traumatic injury, such as breaking a hip or leg. °· People who have recently had major or lengthy surgery, especially on the hip, knee, or abdomen. °· People who have had a central line placed inside a large vein. °· People who take medicines that contain the hormone estrogen. These include birth control pills and hormone replacement therapy. °· Pregnancy or during childbirth or the postpartum period. °· Long plane flights (over 8 hours). °SIGNS AND SYMPTOMS  °Symptoms of VTE can depend on where the clot is located and whether the clot breaks off and travels to another organ. Sometimes, there may be no symptoms. °Symptoms of a DVT can include: °· Swelling of your leg or arm, especially if one side is much worse. °· Warmth and redness of your leg or arm, especially if one side is much worse. °· Pain in your arm or leg. If the clot is in your leg, symptoms may be more noticeable or worse when you stand or walk. °· A feeling of pins and needles if the clot is in the arm. °The symptoms of a PE usually start suddenly and include: °· Shortness of breath while active or at rest. °· Coughing or coughing up blood or   blood-tinged mucus. °· Chest pain that is often worse with deep breaths. °· Rapid or irregular heartbeat. °· Feeling light-headed or dizzy. °· Fainting. °· Feeling anxious. °· Sweating. °There may also be pain and swelling in a leg if that is where the blood clot started. °These symptoms may represent a serious problem that is an emergency. Do not wait to see if the symptoms will go away. Get medical help right away. Call your local emergency services (911 in the [place] Do not drive yourself to the hospital. °DIAGNOSIS °Your health care provider will take a medical history  and perform a physical exam. You may also have other tests, including: °· Blood tests to assess the clotting properties of your blood. °· Imaging tests, such as CT, ultrasound, MRI, X-ray, and other tests to see if you have clots anywhere in your body. °· An electrocardiogram (ECG) to look for heart strain from blood clots in the lungs. °· An echocardiogram. °TREATMENT °After a VTE is identified, it can be treated. The main goals of treatment are: °· To stop a blood clot from growing larger. °· To stop new blood clots from forming. °· To stop a blood clot from traveling to the lungs (pulmonary embolism). °The type of treatment that you receive depends on many factors, such as the cause of your VTE, your risk for bleeding or developing more clots, and other medical conditions that you have. Sometimes, a combination of treatments is necessary. Treatment options may be combined and include: °· Monitoring the blood clot with ultrasound. °· Taking medicines by mouth, such as newer blood thinners (anticoagulants), thrombolytics, or warfarin. °· Taking anticoagulant medicine by injection or through an IV tube. °· Wearing compression stockings or using different types of devices. °· Surgery (rare) to remove the blood clot or to place a filter in your abdomen to stop the blood clot from traveling to your lungs. °Treatments for VTE are often divided into immediate treatment and long-term treatment (up to 3 months after VTE). You can work with your health care provider to choose the treatment program that is best for you. °HOME CARE INSTRUCTIONS °If you are taking a newer oral anticoagulant:  °· Take the medicine every single day at the same time each day. °· Understand what foods and drugs interact with this medicine. °· Understand that there are no regular blood tests required when using this medicine. °· Understand the side effects of this medicine, including excessive bruising or bleeding. Ask your health care provider or  pharmacist about other possible side effects. °If you are taking warfarin: °· Understand how to take warfarin and know which foods can affect how warfarin works in your body. °· Understand that it is dangerous to take too much or too little warfarin. Too much warfarin increases the risk of bleeding. Too little warfarin continues to allow the risk for blood clots. °· Follow your PT and INR blood testing schedule. The PT and INR results allow your health care provider to adjust your dose of warfarin. It is very important that you have your PT and INR tested as often as told by your health care provider. °· Avoid major changes in your diet, or tell your health care provider before you change your diet. Arrange a visit with a registered dietitian to answer your questions. Many foods, especially foods that are high in vitamin K, can interfere with warfarin and affect the PT and INR results. Eat a consistent amount of foods that are high in vitamin K, such as: °¨   Spinach, kale, broccoli, cabbage, collard greens, turnip greens, Brussels sprouts, peas, cauliflower, seaweed, and parsley. °¨ Beef liver and pork liver. °¨ Green tea. °¨ Soybean oil. °· Tell your health care provider about any and all medicines, vitamins, and supplements that you take, including aspirin and other over-the-counter anti-inflammatory medicines. Be especially cautious with aspirin and anti-inflammatory medicines. Do not take those before you ask your health care provider if it is safe to do so. This is important because many medicines can interfere with warfarin and affect the PT and INR results. °· Do not start or stop taking any over-the-counter or prescription medicine unless your health care provider or pharmacist tells you to do so. °If you take warfarin, you will also need to do these things: °· Hold pressure over cuts for longer than usual. °· Tell your dentist and other health care providers that you are taking warfarin before you have any  procedures in which bleeding may occur. °· Avoid alcohol or drink very small amounts. Tell your health care provider if you change your alcohol intake. °· Do not use tobacco products, including cigarettes, chewing tobacco, and e-cigarettes. If you need help quitting, ask your health care provider. °· Avoid contact sports. °General Instructions °· Take over-the-counter and prescription medicines only as told by your health care provider. Anticoagulant medicines can have side effects, including easy bruising and difficulty stopping bleeding. If you are prescribed an anticoagulant, you will also need to do these things: °¨ Hold pressure over cuts for longer than usual. °¨ Tell your dentist and other health care providers that you are taking anticoagulants before you have any procedures in which bleeding may occur. °¨ Avoid contact sports. °· Wear a medical alert bracelet or carry a medical alert card that says you have had a PE. °· Ask your health care provider how soon you can go back to your normal activities. Stay active to prevent new blood clots from forming. °· Make sure to exercise while traveling or when you have been sitting or standing for a long period of time. It is very important to exercise. Exercise your legs by walking or by tightening and relaxing your leg muscles often. Take frequent walks. °· Wear compression stockings as told by your health care provider to help prevent more blood clots from forming. °· Do not use tobacco products, including cigarettes, chewing tobacco, and e-cigarettes. If you need help quitting, ask your health care provider. °· Keep all follow-up appointments with your health care provider. This is important. °PREVENTION °Take these actions to decrease your risk of developing another VTE: °· Exercise regularly. For at least 30 minutes every day, engage in: °¨ Activity that involves moving your arms and legs. °¨ Activity that encourages good blood flow through your body by  increasing your heart rate. °· Exercise your arms and legs every hour during long-distance travel (over 4 hours). Drink plenty of water and avoid drinking alcohol while traveling. °· Avoid sitting or lying in bed for long periods of time without moving your legs. °· Maintain a weight that is appropriate for your height. Ask your health care provider what weight is healthy for you. °· If you are a woman who is over 35 years of age, avoid unnecessary use of medicines that contain estrogen. These include birth control pills. °· Do not smoke, especially if you take estrogen medicines. If you need help quitting, ask your health care provider. °If you are hospitalized, prevention measures may include: °· Early walking after surgery,   as soon as your health care provider says that it is safe. °· Receiving anticoagulants to prevent blood clots. If you cannot take anticoagulants, other options may be available, such as wearing compression stockings or using different types of devices. °SEEK IMMEDIATE MEDICAL CARE IF: °· You have new or increased pain, swelling, or redness in an arm or leg. °· You have numbness or tingling in an arm or leg. °· You have shortness of breath while active or at rest. °· You have chest pain. °· You have a rapid or irregular heartbeat. °· You feel light-headed or dizzy. °· You cough up blood. °· You notice blood in your vomit, bowel movement, or urine. °These symptoms may represent a serious problem that is an emergency. Do not wait to see if the symptoms will go away. Get medical help right away. Call your local emergency services (911 in the [place] Do not drive yourself to the hospital. °  °This information is not intended to replace advice given to you by your health care provider. Make sure you discuss any questions you have with your health care provider. °  °Document Released: 12/23/2008 Document Revised: 11/16/2014 Document Reviewed: 06/22/2014 °Elsevier Interactive Patient Education ©2016  [employer] ° °

## 2014-12-15 NOTE — ED Provider Notes (Signed)
CSN: [id number]     Arrival date & time 12/15/14  1121 History   First MD Initiated Contact with Patient 12/15/14 1205     Chief Complaint  Patient presents with  . Cough     (Consider location/radiation/quality/duration/timing/severity/associated sxs/prior Treatment) HPI Comments: Patient presents with right-sided chest pain and hemoptysis. He was diagnosed with a pulmonary embolus on September 11 of this year. He is currently on Xarelto. He states he's had a little bit worsening pain in his right side which is pleuritic in nature. He also has had some hemoptysis and worsening cough that started about 4-5 days ago. He denies any shortness of breath. He was seen for the first time as a hospital follow-up with his PCP today. He was sent over here to get CT imaging of his chest. It's unclear where his PE originated. He did have Dopplers of his lower extremities on his hospitalization and there is no evidence of DVT. He has no history of clots in the past. He states he's taking the xarelto consistently.  Patient is a 54 y.o. male presenting with cough.  Cough Associated symptoms: chest pain   Associated symptoms: no chills, no diaphoresis, no fever, no headaches, no rash, no rhinorrhea and no shortness of breath     Past Medical History  Diagnosis Date  . Diabetes mellitus without complication (HCC)   . Pulmonary embolism (HCC)    History reviewed. No pertinent past surgical history. Family History  Problem Relation Age of Onset  . Heart disease Mother   . Diabetes Mellitus II Maternal Grandmother    Social History  Substance Use Topics  . Smoking status: Never Smoker   . Smokeless tobacco: None  . Alcohol Use: No    Review of Systems  Constitutional: Negative for fever, chills, diaphoresis and fatigue.  HENT: Negative for congestion, rhinorrhea and sneezing.   Eyes: Negative.   Respiratory: Positive for cough. Negative for chest tightness and shortness of breath.         Hemoptysis, dime sized a couple of times/day  Cardiovascular: Positive for chest pain. Negative for leg swelling.  Gastrointestinal: Negative for nausea, vomiting, abdominal pain, diarrhea and blood in stool.  Genitourinary: Negative for frequency, hematuria, flank pain and difficulty urinating.  Musculoskeletal: Negative for back pain and arthralgias.  Skin: Negative for rash.  Neurological: Negative for dizziness, speech difficulty, weakness, numbness and headaches.      Allergies  Review of patient's allergies indicates no known allergies.  Home Medications   Prior to Admission medications   Medication Sig Start Date End Date Taking? Authorizing Provider  azithromycin (ZITHROMAX) 250 MG tablet Take 1 tablet (250 mg total) by mouth daily. Take first 2 tablets together, then 1 every day until finished. 12/15/14   Rolan Bucco, MD  benzonatate (TESSALON) 100 MG capsule Take 1 capsule (100 mg total) by mouth every 8 (eight) hours. 12/15/14   Rolan Bucco, MD  docusate sodium (COLACE) 100 MG capsule Take 1 capsule (100 mg total) by mouth 2 (two) times daily. Use to prevent constipation while on narcotics 11/20/14   Calvert Cantor, MD  insulin aspart (NOVOLOG FLEXPEN) 100 UNIT/ML FlexPen Before each meal 3 times a day, 140-199 - 2 units, 200-250 - 4 units, 251-299 - 6 units,  300-349 - 8 units,  350 or above 10 units. Insulin PEN if approved, provide syringes and needles if needed. 10/11/14   Albertine Grates, MD  Insulin Glargine (LANTUS SOLOSTAR) 100 UNIT/ML Solostar Pen Inject 40 Units into  the skin daily at 10 pm. Patient taking differently: Inject 20 Units into the skin 2 (two) times daily.  10/11/14   Albertine Grates, MD  oxyCODONE (OXY IR/ROXICODONE) 5 MG immediate release tablet Take 1-2 tablets (5-10 mg total) by mouth every 4 (four) hours as needed for moderate pain or severe pain. 11/20/14   Calvert Cantor, MD  Rivaroxaban (XARELTO STARTER PACK) 15 & 20 MG TBPK Take as directed on package: Start with one   tablet by mouth twice a day with food. On Day 22, switch to one  tablet once a day with food. 11/20/14   Calvert Cantor, MD   BP 124/86 mmHg  Pulse 68  Temp(Src) 98.4 F (36.9 C) (Oral)  Resp 18  Ht  (2.007 m)  Wt 326 lb (147.873 kg)  BMI 36.71 kg/m2  SpO2 100% Physical Exam  Constitutional: He is oriented to person, place, and time. He appears well-developed and well-nourished.  HENT:  Head: Normocephalic and atraumatic.  Eyes: Pupils are equal, round, and reactive to light.  Neck: Normal range of motion. Neck supple.  Cardiovascular: Normal rate, regular rhythm and normal heart sounds.   Pulmonary/Chest: Effort normal and breath sounds normal. No respiratory distress. He has no wheezes. He has no rales. He exhibits no tenderness.  Abdominal: Soft. Bowel sounds are normal. There is no tenderness. There is no rebound and no guarding.  Musculoskeletal: Normal range of motion. He exhibits no edema.  No edema or calf tenderness  Lymphadenopathy:    He has no cervical adenopathy.  Neurological: He is alert and oriented to person, place, and time.  Skin: Skin is warm and dry. No rash noted.  Psychiatric: He has a normal mood and affect.    ED Course  Procedures (including critical care time) Labs Review Labs Reviewed  BASIC METABOLIC PANEL - Abnormal; Notable for the following:    Glucose, Bld 103 (*)    Calcium 8.6 (*)    Anion gap 4 (*)    All other components within normal limits  CBC WITH DIFFERENTIAL/PLATELET - Abnormal; Notable for the following:    Hemoglobin 12.7 (*)    All other components within normal limits    Imaging Review Ct Angio Chest Pe W/cm &/or Wo Cm  12/15/2014   CLINICAL DATA:  Recent pulmonary embolus with persistent chest pain and pressure. Cough.  EXAM: CT ANGIOGRAPHY CHEST WITH CONTRAST  TECHNIQUE: Multidetector CT imaging of the chest was performed using the standard protocol during bolus administration of intravenous contrast. Multiplanar CT  image reconstructions and MIPs were obtained to evaluate the vascular anatomy.  CONTRAST:  OMNIPAQUE IOHEXOL 350 MG/ML SOLN  COMPARISON:  CT angiogram chest November 19, 2014; chest radiograph December 03, 2014  FINDINGS: In the interval since recent prior study, there has been significant resolution of pulmonary embolus on the right. There remains an incompletely occluding, somewhat irregular focus of pulmonary embolus at the origin of the superior segment of the right lower lobe pulmonary artery. Other areas of pulmonary embolus have essentially completely resolved. No new foci of pulmonary embolus are identified.  There is no thoracic aortic dissection. Ascending thoracic aorta is prominent measuring 4.4 x 4.3 cm. Visualized great vessels appear normal.  There is a small pleural effusion on the right. There are areas of consolidation in the periphery of the right lower lobe in the superior and posterior segmental regions. Lungs elsewhere clear.  Thyroid appears normal. No appreciable thoracic adenopathy. The pericardium is not thickened.  Visualized upper abdominal structures appear unremarkable. There are no blastic or lytic bone lesions.  Review of the MIP images confirms the above findings.  IMPRESSION: Significant interval resolution of pulmonary emboli with a single residual incompletely obstructing pulmonary embolus in the superior segment right lower lobe pulmonary artery. No new pulmonary emboli identified.  Small right pleural effusion with areas of consolidation in the right base. There may be a degree of pulmonary infarct in the right lower lobe peripherally. Lungs elsewhere clear. Given this appearance in the right lower lobe, it may be reasonable to consider a followup noncontrast enhanced study in 6-8 weeks to further assess.  Prominence of the ascending thoracic aorta with transverse diameter of 4.4 x 4.3 cm. Recommend annual imaging followup by CTA or MRA. This recommendation follows 2010  ACCF/AHA/AATS/ACR/ASA/SCA/SCAI/SIR/STS/SVM Guidelines for the Diagnosis and Management of Patients with Thoracic Aortic Disease. Circulation. 2010; 121: Z610-R604  No adenopathy.  Critical Value/emergent results were called by telephone at the time of interpretation on 12/15/2014 at 1:22 pm to Dr. Shawna Orleans Chanler Schreiter , who verbally acknowledged these results.   Electronically Signed   By: Bretta Bang III M.D.   On: 12/15/2014 13:25   I have personally reviewed and evaluated these images and lab results as part of my medical decision-making.   EKG Interpretation None      MDM   Final diagnoses:  Other chronic pulmonary embolism (HCC)    Patient presents with recent onset of hemoptysis and worsening cough. He also has some increase in his pleuritic chest pain. This is his second visit since being diagnosed with a PE about a month ago. Given this, I did go ahead and repeat a CT scan of his chest. There was evidence of resolving PE. There is an area of consolidation which when I discussed with the radiologist, could indicate infection versus pulmonary infarct. Given his onset of worsening cough, I will go ahead and start him on antibiotics. I also prescribing Tessalon Perles for his cough. I encouraged him to have follow-up with his PCP who is with [facility]. He actually has an appointment on Monday to follow-up. I advised the patient that he will need a repeat CT scan in 6-8 weeks to reassess the area. I also advised him that his thoracic aorta is slightly enlarged and this will need annual exams as well. I put this in his discharge instructions and I also gave him a printout of his CT report to take to his PCP. He will continue the Xarelto. Return precautions were given.    Rolan Bucco, MD 12/15/14 [phone]

## 2014-12-15 NOTE — ED Notes (Signed)
Pt is anxious, states "I have meetings I should be at, I can sit in my meeting and die rather than sit here and die!" explained to pt that dr. Judd Lien is seeing another patient, but that he should be in soon to see him and place orders. Pt declines iv access at this time, explained to pt that part of ruling out a PE is lab studies and possibly a ct scan that would need iv access, pt states "No, I am tired of waiting, I've been waiting 30 minutes already!" explained to pt that he could be in danger if he has a history of PE, pt strongly encouraged to wait for md eval. Pt agrees, states 'I guess I can hang around here for a little while longer...".

## 2014-12-15 NOTE — ED Notes (Signed)
Pain in his right lung area. Hx of PE a month ago. He was started on blood thinners. He went to [facility] and was told to come here for possible CT of his chest.

## 2015-03-07 ENCOUNTER — Emergency Department (HOSPITAL_BASED_OUTPATIENT_CLINIC_OR_DEPARTMENT_OTHER): Payer: 59

## 2015-03-07 ENCOUNTER — Emergency Department (HOSPITAL_BASED_OUTPATIENT_CLINIC_OR_DEPARTMENT_OTHER)
Admission: EM | Admit: 2015-03-07 | Discharge: 2015-03-07 | Disposition: A | Discharge status: Home / Self Care | Payer: 59 | Attending: Emergency Medicine | Admitting: Emergency Medicine

## 2015-03-07 ENCOUNTER — Encounter (HOSPITAL_BASED_OUTPATIENT_CLINIC_OR_DEPARTMENT_OTHER): Payer: Self-pay | Admitting: *Deleted

## 2015-03-07 DIAGNOSIS — Z7901 Long term (current) use of anticoagulants: Secondary | ICD-10-CM | POA: Diagnosis not present

## 2015-03-07 DIAGNOSIS — E119 Type 2 diabetes mellitus without complications: Secondary | ICD-10-CM | POA: Insufficient documentation

## 2015-03-07 DIAGNOSIS — Z9119 Patient's noncompliance with other medical treatment and regimen: Secondary | ICD-10-CM | POA: Insufficient documentation

## 2015-03-07 DIAGNOSIS — Z794 Long term (current) use of insulin: Secondary | ICD-10-CM | POA: Diagnosis not present

## 2015-03-07 DIAGNOSIS — I2782 Chronic pulmonary embolism: Secondary | ICD-10-CM | POA: Diagnosis not present

## 2015-03-07 DIAGNOSIS — R079 Chest pain, unspecified: Secondary | ICD-10-CM | POA: Diagnosis present

## 2015-03-07 LAB — CBC WITH DIFFERENTIAL/PLATELET
Basophils Absolute: 0 10*3/uL (ref 0.0–0.1)
Basophils Relative: 0 %
Eosinophils Absolute: 0.5 10*3/uL (ref 0.0–0.7)
Eosinophils Relative: 8 %
HCT: 41.4 % (ref 39.0–52.0)
Hemoglobin: 13.6 g/dL (ref 13.0–17.0)
Lymphocytes Relative: 25 %
Lymphs Abs: 1.5 10*3/uL (ref 0.7–4.0)
MCH: 29.2 pg (ref 26.0–34.0)
MCHC: 32.9 g/dL (ref 30.0–36.0)
MCV: 89 fL (ref 78.0–100.0)
Monocytes Absolute: 0.4 10*3/uL (ref 0.1–1.0)
Monocytes Relative: 7 %
Neutro Abs: 3.5 10*3/uL (ref 1.7–7.7)
Neutrophils Relative %: 60 %
Platelets: 218 10*3/uL (ref 150–400)
RBC: 4.65 MIL/uL (ref 4.22–5.81)
RDW: 14.6 % (ref 11.5–15.5)
WBC: 6 10*3/uL (ref 4.0–10.5)

## 2015-03-07 LAB — BASIC METABOLIC PANEL
Anion gap: 7 (ref 5–15)
BUN: 11 mg/dL (ref 6–20)
CO2: 25 mmol/L (ref 22–32)
Calcium: 8.4 mg/dL — ABNORMAL LOW (ref 8.9–10.3)
Chloride: 106 mmol/L (ref 101–111)
Creatinine, Ser: 1.31 mg/dL — ABNORMAL HIGH (ref 0.61–1.24)
GFR calc Af Amer: >60 mL/min (ref >60)
GFR calc non Af Amer: >60 mL/min (ref >60)
Glucose, Bld: 142 mg/dL — ABNORMAL HIGH (ref 65–99)
Potassium: 3.9 mmol/L (ref 3.5–5.1)
Sodium: 138 mmol/L (ref 135–145)

## 2015-03-07 LAB — TROPONIN I: Troponin I: <0.03 ng/mL (ref <0.031)

## 2015-03-07 MED ORDER — IOHEXOL 350 MG/ML SOLN
100.0000 mL | Freq: Once | INTRAVENOUS | Status: AC | PRN
  Administered 2015-03-07: 100 mL via INTRAVENOUS

## 2015-03-07 MED ORDER — RIVAROXABAN (XARELTO) VTE STARTER PACK (15 & 20 MG): ORAL_TABLET | ORAL | Status: DC

## 2015-03-07 MED ORDER — SODIUM CHLORIDE 0.9 % IV BOLUS (SEPSIS)
1000.0000 mL | Freq: Once | INTRAVENOUS | Status: AC
  Administered 2015-03-07: 1000 mL via INTRAVENOUS

## 2015-03-07 MED ORDER — MORPHINE SULFATE (PF) 4 MG/ML IV SOLN: 6.0000 mg | Freq: Once | INTRAVENOUS | Status: DC

## 2015-03-07 MED ORDER — AZITHROMYCIN 250 MG PO TABS: 250.0000 mg | ORAL_TABLET | Freq: Every day | ORAL | Status: DC

## 2015-03-07 MED ORDER — AZITHROMYCIN 250 MG PO TABS
500.0000 mg | ORAL_TABLET | Freq: Once | ORAL | Status: AC
  Administered 2015-03-07: 500 mg via ORAL
  Filled 2015-03-07: qty 2

## 2015-03-07 MED ORDER — DEXTROSE 5 % IV SOLN
1.0000 g | Freq: Once | INTRAVENOUS | Status: AC
  Administered 2015-03-07: 1 g via INTRAVENOUS

## 2015-03-07 MED ORDER — KETOROLAC TROMETHAMINE 15 MG/ML IJ SOLN
15.0000 mg | Freq: Once | INTRAMUSCULAR | Status: AC
  Administered 2015-03-07: 15 mg via INTRAVENOUS
  Filled 2015-03-07: qty 1

## 2015-03-07 MED ORDER — CEFTRIAXONE SODIUM 1 G IJ SOLR
INTRAMUSCULAR | Status: AC
  Filled 2015-03-07: qty 10

## 2015-03-07 MED ORDER — RIVAROXABAN 15 MG PO TABS
15.0000 mg | ORAL_TABLET | Freq: Once | ORAL | Status: AC
  Administered 2015-03-07: 15 mg via ORAL
  Filled 2015-03-07: qty 1

## 2015-03-07 NOTE — ED Notes (Addendum)
C/o pain in right side pain. Was seen 1 1/2 month ago for same at [facility] and found to have blood clot in right lung. PT told to follow up. States he has pain in side when he takes a deep breath. PT states he stopped taking xarelto 1 month ago, he doesn't like taking meds.

## 2015-03-07 NOTE — ED Provider Notes (Signed)
CSN: [id number]     Arrival date & time 03/07/15  4098 History   First MD Initiated Contact with Patient 03/07/15 0746     No chief complaint on file.    (Consider location/radiation/quality/duration/timing/severity/associated sxs/prior Treatment) HPI   54 year old male with right-sided chest pain. Worsening over the past 2 days. Patient has a past history of pulmonary embolism. Reports similar symptoms previously from this.He was prescribed a relative. He reports that he has not taken this in a few weeks. He stopped his symptoms  Symptoms improved.Pain is pleuritic. May be some mild shortness of breath. No unusual leg pain or swelling.  No fevers or chills.  Past Medical History  Diagnosis Date  . Diabetes mellitus without complication (HCC)   . Pulmonary embolism (HCC)    History reviewed. No pertinent past surgical history. Family History  Problem Relation Age of Onset  . Heart disease Mother   . Diabetes Mellitus II Maternal Grandmother    Social History  Substance Use Topics  . Smoking status: Never Smoker   . Smokeless tobacco: None  . Alcohol Use: No    Review of Systems  All systems reviewed and negative, other than as noted in HPI.   Allergies  Review of patient's allergies indicates no known allergies.  Home Medications   Prior to Admission medications   Medication Sig Start Date End Date Taking? Authorizing Provider  insulin aspart (NOVOLOG FLEXPEN) 100 UNIT/ML FlexPen Before each meal 3 times a day, 140-199 - 2 units, 200-250 - 4 units, 251-299 - 6 units,  300-349 - 8 units,  350 or above 10 units. Insulin PEN if approved, provide syringes and needles if needed. 10/11/14  Yes Albertine Grates, MD  Insulin Glargine (LANTUS SOLOSTAR) 100 UNIT/ML Solostar Pen Inject 40 Units into the skin daily at 10 pm. Patient taking differently: Inject 20 Units into the skin 2 (two) times daily.  10/11/14  Yes Albertine Grates, MD  Rivaroxaban (XARELTO STARTER PACK) 15 & 20 MG TBPK Take as  directed on package: Start with one  tablet by mouth twice a day with food. On Day 22, switch to one  tablet once a day with food. 11/20/14  Yes Saima Rizwan, MD   BP 132/93 mmHg  Pulse 72  Temp(Src) 98 F (36.7 C) (Oral)  Resp 20  Ht  (2.007 m)  Wt 299 lb (135.626 kg)  BMI 33.67 kg/m2  SpO2 100% Physical Exam  Constitutional: He appears well-developed and well-nourished. No distress.  HENT:  Head: Normocephalic and atraumatic.  Eyes: Conjunctivae are normal. Right eye exhibits no discharge. Left eye exhibits no discharge.  Neck: Neck supple.  Cardiovascular: Normal rate, regular rhythm and normal heart sounds.  Exam reveals no gallop and no friction rub.   No murmur heard. Pulmonary/Chest: Effort normal and breath sounds normal. No respiratory distress.  Abdominal: Soft. He exhibits no distension. There is no tenderness.  Musculoskeletal: He exhibits no edema or tenderness.  Neurological: He is alert.  Skin: Skin is warm and dry.  Psychiatric: He has a normal mood and affect. His behavior is normal. Thought content normal.  Nursing note and vitals reviewed.   ED Course  Procedures (including critical care time) Labs Review Labs Reviewed  BASIC METABOLIC PANEL - Abnormal; Notable for the following:    Glucose, Bld 142 (*)    Creatinine, Ser 1.31 (*)    Calcium 8.4 (*)    All other components within normal limits  CBC WITH DIFFERENTIAL/PLATELET  TROPONIN I  Imaging Review Ct Angio Chest Pe W/cm &/or Wo Cm  03/07/2015  CLINICAL DATA:  Two day history of right-sided chest pain. History of prior pulmonary emboli EXAM: CT ANGIOGRAPHY CHEST WITH CONTRAST TECHNIQUE: Multidetector CT imaging of the chest was performed using the standard protocol during bolus administration of intravenous contrast. Multiplanar CT image reconstructions and MIPs were obtained to evaluate the vascular anatomy. CONTRAST:  OMNIPAQUE IOHEXOL 350 MG/ML SOLN COMPARISON:  Chest CT  February 14, 2015 FINDINGS: In comparison with the previous study, there are residual small filling defects in the proximal right lower lobe pulmonary artery which appear stable compared to the previous study and may represent residua of chronic pulmonary embolus. A smaller similar defect, incompletely obstructing, at the origin of the superior segment left lower lobe pulmonary artery is also present. No new lung filling defects to indicate acute pulmonary embolus are appreciable on this study to indicate acute pulmonary embolus. There is again noted prominence of the ascending thoracic aorta with a maximum transverse diameter of 4.4 x 4.3 cm. No aortic dissection is seen. Visualized great vessels appear unremarkable. There is a moderate pleural effusion on the right. There is a somewhat wedge-shaped consolidation in the right base consistent with either pulmonary infarct or area of pneumonia. There is mild atelectasis in the lateral segment right middle lobe. The lungs are otherwise clear except for what is felt to be mild dependent atelectasis posteriorly on both sides. Visualized thyroid appears normal. There is no appreciable thoracic adenopathy. The pericardium is not thickened. Visualized upper abdominal structures appear unremarkable. There are no blastic or lytic bone lesions. Review of the MIP images confirms the above findings. IMPRESSION: Probable chronic pulmonary emboli, incompletely obstructing, on both sides. No new filling defects to indicate acute pulmonary embolus. Prominence of the ascending thoracic aorta remains without change. No periaortic fluid or dissection. Recommend annual imaging followup by CTA or MRA. This recommendation follows 2010 ACCF/AHA/AATS/ACR/ASA/SCA/SCAI/SIR/STS/SVM Guidelines for the Diagnosis and Management of Patients with Thoracic Aortic Disease. Circulation. 2010; 121: X324-M010 Moderate right pleural effusion. Question pulmonary infarct right lower lobe versus focus of  pneumonia. There also areas of patchy atelectasis, most notably in the right middle lobe laterally. No demonstrable thoracic adenopathy. Electronically Signed   By: Bretta Bang III M.D.   On: 03/07/2015 09:59   I have personally reviewed and evaluated these images and lab results as part of my medical decision-making.   EKG Interpretation   Date/Time:  Tuesday March 07 2015 08:20:18 EST Ventricular Rate:  75 PR Interval:  158 QRS Duration: 88 QT Interval:  370 QTC Calculation: 413 R Axis:   18 Text Interpretation:  Normal sinus rhythm No significant change since last  tracing Confirmed by Labarron Durnin  MD, [place] ([id number]) on 03/07/2015 9:50:26 AM      MDM   Final diagnoses:  None    54 year old male with particle right-sided chest pain. He has been noncompliant with Xarelto. CT today shows chronic pulmonary embolism. Evidence of pulmonary infarction versus possible pneumonia. I feel infection is less likely but will cover with antibiotics. Will place back on xarelto. Imaging essentially unchanged, but with new symptoms again may having increasing clot/progressing infarction. Will start back at BID dosing. Stressed multiple times the importance of taking his medications appropriately and that they need to be taken as advised, but simply until he starts feeling better. He has insurance and reports he can afford them. Advised aorta stable since last imaging and this needs yearly surveillance. Emergent return  precautions reiterated.     Raeford RazorStephen Marguerette Sheller, MD 03/17/15 1227

## 2015-03-07 NOTE — Discharge Instructions (Signed)
You need to take all of your medications as prescribed. You will need to take xarelto beyond the starter pack. You need to follow-up with your PCP or establish one as soon as you can.  Venous Thromboembolism Venous thromboembolism (VTE) is a condition in which a blood clot (thrombus) develops in the body. A thrombus usually occurs in a deep vein in the leg or the pelvis, but it can also occur in the arm. Sometimes, pieces of a thrombus can break off from its original place of development and travel through the bloodstream to other parts of the body. When that happens, the thrombus is called an embolism. An embolism can block the blood flow in the blood vessels of other organs. There are two serious types of VTE:  Deep vein thrombosis (DVT). A DVT is a thrombus that usually occurs in a deep, larger vein of the lower leg or the pelvis, or in an upper extremity such as the arm.  Pulmonary embolism (PE). A PE occurs when an embolism has formed and traveled to the lungs. A PE can block or decrease the blood flow in one lung or both lungs. VTE is a serious health condition that can cause disability or death. It is very important to get help right away and to not ignore symptoms. CAUSES VTE is caused by the formation of a blood clot in your leg, pelvis, or arm. Usually, several things contribute to the formation of blood clots. A clot may develop when:  Your blood flow slows down.  Your vein becomes damaged in some way.  You have a condition that makes your blood clot more easily. RISK FACTORS A VTE is more likely to develop in:  People who are older, especially over 36 years of age.  People who are overweight (obese).  People who sit or lie still for a long time, such as during long-distance travel (over 4 hours), bed rest, hospitalization, or during recovery from certain medical conditions like a stroke.  People who do not engage in much physical activity (sedentary lifestyle).  People who  have chronic breathing disorders.  People who have a personal or family history of blood clots or blood clotting disease.  People who have peripheral vascular disease (PVD), diabetes, or some types of cancer.  People who have heart disease, especially if the person had a recent heart attack or has congestive heart failure.  People who have neurological diseases that affect the legs (leg paresis).  People who have had a traumatic injury, such as breaking a hip or leg.  People who have recently had major or lengthy surgery, especially on the hip, knee, or abdomen.  People who have had a central line placed inside a large vein.  People who take medicines that contain the hormone estrogen. These include birth control pills and hormone replacement therapy.  Pregnancy or during childbirth or the postpartum period.  Long plane flights (over 8 hours). SIGNS AND SYMPTOMS  Symptoms of VTE can depend on where the clot is located and whether the clot breaks off and travels to another organ. Sometimes, there may be no symptoms. Symptoms of a DVT can include:  Swelling of your leg or arm, especially if one side is much worse.  Warmth and redness of your leg or arm, especially if one side is much worse.  Pain in your arm or leg. If the clot is in your leg, symptoms may be more noticeable or worse when you stand or walk.  A feeling of  pins and needles if the clot is in the arm. The symptoms of a PE usually start suddenly and include:  Shortness of breath while active or at rest.  Coughing or coughing up blood or blood-tinged mucus.  Chest pain that is often worse with deep breaths.  Rapid or irregular heartbeat.  Feeling light-headed or dizzy.  Fainting.  Feeling anxious.  Sweating. There may also be pain and swelling in a leg if that is where the blood clot started. These symptoms may represent a serious problem that is an emergency. Do not wait to see if the symptoms will go away.  Get medical help right away. Call your local emergency services (911 in the [place] Do not drive yourself to the hospital. DIAGNOSIS Your health care provider will take a medical history and perform a physical exam. You may also have other tests, including:  Blood tests to assess the clotting properties of your blood.  Imaging tests, such as CT, ultrasound, MRI, X-ray, and other tests to see if you have clots anywhere in your body.  An electrocardiogram (ECG) to look for heart strain from blood clots in the lungs.  An echocardiogram. TREATMENT After a VTE is identified, it can be treated. The main goals of treatment are:  To stop a blood clot from growing larger.  To stop new blood clots from forming.  To stop a blood clot from traveling to the lungs (pulmonary embolism). The type of treatment that you receive depends on many factors, such as the cause of your VTE, your risk for bleeding or developing more clots, and other medical conditions that you have. Sometimes, a combination of treatments is necessary. Treatment options may be combined and include:  Monitoring the blood clot with ultrasound.  Taking medicines by mouth, such as newer blood thinners (anticoagulants), thrombolytics, or warfarin.  Taking anticoagulant medicine by injection or through an IV tube.  Wearingcompression stockings or using different types of devices.  Surgery (rare) to remove the blood clot or to place a filter in your abdomen to stop the blood clot from traveling to your lungs. Treatments for VTE are often divided into immediate treatment and long-term treatment (up to 3 months after VTE). You can work with your health care provider to choose the treatment program that is best for you. HOME CARE INSTRUCTIONS If you are taking a newer oral anticoagulant:  Take the medicine every single day at the same time each day.  Understand what foods and drugs interact with this medicine.  Understand that  there are no regular blood tests required when using this medicine.  Understand the side effects of this medicine, including excessive bruising or bleeding. Ask your health care provider or pharmacist about other possible side effects. If you are taking warfarin:  Understand how to take warfarin and know which foods can affect how warfarin works in [employer].  Understand that it is dangerous to take too much or too little warfarin. Too much warfarin increases the risk of bleeding. Too little warfarin continues to allow the risk for blood clots.  Follow your PT and INR blood testing schedule. The PT and INR results allow your health care provider to adjust your dose of warfarin. It is very important that you have your PT and INR tested as often as told by your health care provider.  Avoid major changes in your diet, or tell your health care provider before you change your diet. Arrange a visit with a registered dietitian to answer your  questions. Many foods, especially foods that are high in vitamin K, can interfere with warfarin and affect the PT and INR results. Eat a consistent amount of foods that are high in vitamin K, such as:  Spinach, kale, broccoli, cabbage, collard greens, turnip greens, Brussels sprouts, peas, cauliflower, seaweed, and parsley.  Beef liver and pork liver.  Green tea.  Soybean oil.  Tell your health care provider about any and all medicines, vitamins, and supplements that you take, including aspirin and other over-the-counter anti-inflammatory medicines. Be especially cautious with aspirin and anti-inflammatory medicines. Do not take those before you ask your health care provider if it is safe to do so. This is important because many medicines can interfere with warfarin and affect the PT and INR results.  Do not start or stop taking any over-the-counter or prescription medicine unless your health care provider or pharmacist tells you to do so. If you take warfarin,  you will also need to do these things:  Hold pressure over cuts for longer than usual.  Tell your dentist and other health care providers that you are taking warfarin before you have any procedures in which bleeding may occur.  Avoid alcohol or drink very small amounts. Tell your health care provider if you change your alcohol intake.  Do not use tobacco products, including cigarettes, chewing tobacco, and e-cigarettes. If you need help quitting, ask your health care provider.  Avoid contact sports. General Instructions  Take over-the-counter and prescription medicines only as told by your health care provider. Anticoagulant medicines can have side effects, including easy bruising and difficulty stopping bleeding. If you are prescribed an anticoagulant, you will also need to do these things:  Hold pressure over cuts for longer than usual.  Tell your dentist and other health care providers that you are taking anticoagulants before you have any procedures in which bleeding may occur.  Avoid contact sports.  Wear a medical alert bracelet or carry a medical alert card that says you have had a PE.  Ask your health care provider how soon you can go back to your normal activities. Stay active to prevent new blood clots from forming.  Make sure to exercise while traveling or when you have been sitting or standing for a long period of time. It is very important to exercise. Exercise your legs by walking or by tightening and relaxing your leg muscles often. Take frequent walks.  Wear compression stockings as told by your health care provider to help prevent more blood clots from forming.  Do not use tobacco products, including cigarettes, chewing tobacco, and e-cigarettes. If you need help quitting, ask your health care provider.  Keep all follow-up appointments with your health care provider. This is important. PREVENTION Take these actions to decrease your risk of developing another  VTE:  Exercise regularly. For at least 30 minutes every day, engage in:  Activity that involves moving your arms and legs.  Activity that encourages good blood flow through your body by increasing your heart rate.  Exercise your arms and legs every hour during long-distance travel (over 4 hours). Drink plenty of water and avoid drinking alcohol while traveling.  Avoid sitting or lying in bed for long periods of time without moving your legs.  Maintain a weight that is appropriate for your height. Ask your health care provider what weight is healthy for you.  If you are a woman who is over 52 years of age, avoid unnecessary use of medicines that contain estrogen. These  include birth control pills.  Do not smoke, especially if you take estrogen medicines. If you need help quitting, ask your health care provider. If you are hospitalized, prevention measures may include:  Early walking after surgery, as soon as your health care provider says that it is safe.  Receiving anticoagulants to prevent blood clots.If you cannot take anticoagulants, other options may be available, such as wearing compression stockings or using different types of devices. SEEK IMMEDIATE MEDICAL CARE IF:  You have new or increased pain, swelling, or redness in an arm or leg.  You have numbness or tingling in an arm or leg.  You have shortness of breath while active or at rest.  You have chest pain.  You have a rapid or irregular heartbeat.  You feel light-headed or dizzy.  You cough up blood.  You notice blood in your vomit, bowel movement, or urine. These symptoms may represent a serious problem that is an emergency. Do not wait to see if the symptoms will go away. Get medical help right away. Call your local emergency services (911 in the [place] Do not drive yourself to the hospital.   This information is not intended to replace advice given to you by your health care provider. Make sure you discuss any  questions you have with your health care provider.   Document Released: 12/23/2008 Document Revised: 11/16/2014 Document Reviewed: 06/22/2014 [employer] Interactive Patient Education 2016 [employer]  Venous Thromboembolism Prevention Venous thromboembolism (VTE) is a condition in which a blood clot (thrombus) develops in the body. A thrombus usually occurs in a deep vein in the leg or the pelvis (DVT), but it can also occur in the arm. Sometimes, pieces of a thrombus can break off from its original place of development and travel through the bloodstream to other parts of the body. When that happens, the thrombus is called an embolus. An embolus that travels to one or both lungs is called a pulmonary embolism. An embolism can block the blood flow in the blood vessels of other organs as well. VTE is a serious health condition that can cause disability or death. It is very important to get help right away and to not ignore symptoms. HOW CAN A VTE BE PREVENTED?  Exercise regularly. Take a brisk 30 minute walk every day. Staying active and moving around can help you to prevent blood clots.  Avoid sitting or lying in bed for long periods of time without moving your legs. Change your position often, especially during long-distance travel (over 4 hours).  If you are a woman who is over 53 years of age, avoid unnecessary use of medicines that contain estrogen. These include birth control pills and hormone replacement therapy.  Do not smoke, especially if you take estrogen medicines. If you need help quitting, ask your health care provider.  Eat plenty of fruits and vegetables. Ask your health care provider or dietitian if there are foods that you should avoid.  Maintain a weight that is appropriate for your height. Ask your health care provider what weight is healthy for you.  Wear loose-fitting clothing. Avoid constrictive or tight clothing around your legs or waist.  Try not to bump or injure  your legs. Avoid crossing your legs when you are sitting.  Do not use pillows under your knees while lying down unless told by your health care provider.  Wear support hose (compression stockings or TED hose) as told by your health care provider Compression stockings increase blood flow in  your legs and can help prevent blood clots. Do not let them bunch up when you are wearing them. HOW CAN I PREVENT VTE WHEN I TRAVEL? Long-distance travel (over 4 hours) can increase the risk of a VTE. To prevent VTE when traveling:  Exercise your legs every hour by standing, stretching, and bending and straightening your legs. If you are traveling by airplane, train, or bus, walk up and down the aisle as often as possible to get your blood moving. If you are traveling by car, stop and get out of the car every hour to exercise your legs and stretch. Other types of exercise might include:  Keeping your feet flat on the ground and raising your toes.  Switching from tightening the muscles in your calves and thighs to relaxing those same muscles while you are sitting.  Pointing and flexing your feet at the ankle joints while you are sitting.  Stay well hydrated while traveling. Drink enough water to keep your urine clear or pale yellow.  Avoid drinking alcohol during long travel. Generally, it is not recommended that you take medicines to prevent DVT during routine travel. HOW CAN VTE BE PREVENTED IF I AM HOSPITALIZED? A VTE may be prevented by taking medicines that are prescribed to prevent blood clots (anticoagulants). You can also help to prevent VTE while in the hospital by taking these actions:  Get out of bed and walk. Ask your health care provider if this is safe for you to do.  Request the use of a sequential compression device (SCD). This is a machine that pumps air into compression sleeves that are wrapped around your legs.  Request the use of compression stockings, which are tight, elastic  stockings that apply pressure to the lower legs. Compression stockings are sometimes used with SCDs. HOW CAN I PREVENT VTE AFTER SURGERY? Understand that there is an increased risk for VTE for the first 4-6 weeks after surgery. During this time:  Avoid long-distance travel (over 4 hours). If you must travel during this time, ask your health care provider about additional preventive actions that you can take. These might include exercising your arms and legs every hour while you travel.  Avoid sitting or lying still for too long. If possible, get up and walk around one time every hour. Ask your health care provider when this is safe for you to do. SEEK IMMEDIATE MEDICAL CARE IF:  You have new or increased pain, swelling, or redness in an arm or leg.  You have numbness or tingling in an arm or leg.  You have shortness of breath while active or at rest.  You have chest pain.  You have a rapid or irregular heartbeat.  You feel light-headed or dizzy.  You cough up blood.  You notice blood in your vomit, bowel movement, or urine. These symptoms may represent a serious problem that is an emergency. Do not wait to see if the symptoms will go away. Get medical help right away. Call your local emergency services (911 in the [place] Do not drive yourself to the hospital.   This information is not intended to replace advice given to you by your health care provider. Make sure you discuss any questions you have with your health care provider.   Document Released: 02/13/2009 Document Revised: 11/16/2014 Document Reviewed: 06/22/2014 [employer] Interactive Patient Education [employer]

## 2015-05-06 ENCOUNTER — Emergency Department (HOSPITAL_COMMUNITY): Payer: 59

## 2015-05-06 ENCOUNTER — Observation Stay (HOSPITAL_COMMUNITY): Payer: 59

## 2015-05-06 ENCOUNTER — Observation Stay (HOSPITAL_COMMUNITY)
Admission: EM | Admit: 2015-05-06 | Discharge: 2015-05-10 | Disposition: A | Discharge status: Home / Self Care | Payer: 59 | Attending: Internal Medicine | Admitting: Internal Medicine

## 2015-05-06 ENCOUNTER — Encounter (HOSPITAL_COMMUNITY): Payer: Self-pay | Admitting: Emergency Medicine

## 2015-05-06 DIAGNOSIS — N182 Chronic kidney disease, stage 2 (mild): Secondary | ICD-10-CM | POA: Insufficient documentation

## 2015-05-06 DIAGNOSIS — E669 Obesity, unspecified: Secondary | ICD-10-CM | POA: Diagnosis not present

## 2015-05-06 DIAGNOSIS — N179 Acute kidney failure, unspecified: Secondary | ICD-10-CM

## 2015-05-06 DIAGNOSIS — R748 Abnormal levels of other serum enzymes: Secondary | ICD-10-CM | POA: Insufficient documentation

## 2015-05-06 DIAGNOSIS — R1011 Right upper quadrant pain: Principal | ICD-10-CM | POA: Insufficient documentation

## 2015-05-06 DIAGNOSIS — N189 Chronic kidney disease, unspecified: Secondary | ICD-10-CM | POA: Diagnosis not present

## 2015-05-06 DIAGNOSIS — Z7901 Long term (current) use of anticoagulants: Secondary | ICD-10-CM | POA: Insufficient documentation

## 2015-05-06 DIAGNOSIS — R52 Pain, unspecified: Secondary | ICD-10-CM

## 2015-05-06 DIAGNOSIS — Z79899 Other long term (current) drug therapy: Secondary | ICD-10-CM | POA: Diagnosis not present

## 2015-05-06 DIAGNOSIS — R109 Unspecified abdominal pain: Secondary | ICD-10-CM | POA: Diagnosis present

## 2015-05-06 DIAGNOSIS — Z794 Long term (current) use of insulin: Secondary | ICD-10-CM | POA: Insufficient documentation

## 2015-05-06 DIAGNOSIS — Z86711 Personal history of pulmonary embolism: Secondary | ICD-10-CM | POA: Diagnosis not present

## 2015-05-06 DIAGNOSIS — E1122 Type 2 diabetes mellitus with diabetic chronic kidney disease: Secondary | ICD-10-CM | POA: Diagnosis not present

## 2015-05-06 DIAGNOSIS — I2699 Other pulmonary embolism without acute cor pulmonale: Secondary | ICD-10-CM | POA: Diagnosis present

## 2015-05-06 DIAGNOSIS — R1010 Upper abdominal pain, unspecified: Secondary | ICD-10-CM

## 2015-05-06 DIAGNOSIS — Z6835 Body mass index (BMI) 35.0-35.9, adult: Secondary | ICD-10-CM | POA: Diagnosis not present

## 2015-05-06 DIAGNOSIS — E119 Type 2 diabetes mellitus without complications: Secondary | ICD-10-CM

## 2015-05-06 DIAGNOSIS — K8500 Idiopathic acute pancreatitis without necrosis or infection: Secondary | ICD-10-CM | POA: Insufficient documentation

## 2015-05-06 DIAGNOSIS — K859 Acute pancreatitis without necrosis or infection, unspecified: Secondary | ICD-10-CM | POA: Diagnosis present

## 2015-05-06 HISTORY — DX: Chronic kidney disease, stage 2 (mild): N18.2

## 2015-05-06 LAB — URINALYSIS, ROUTINE W REFLEX MICROSCOPIC
Bilirubin Urine: NEGATIVE
Hgb urine dipstick: NEGATIVE
KETONES UR: NEGATIVE mg/dL
LEUKOCYTES UA: NEGATIVE
Nitrite: NEGATIVE
PH: 5.5 (ref 5.0–8.0)
Protein, ur: NEGATIVE mg/dL
SPECIFIC GRAVITY, URINE: 1.027 (ref 1.005–1.030)

## 2015-05-06 LAB — BASIC METABOLIC PANEL
ANION GAP: 8 (ref 5–15)
BUN: 18 mg/dL (ref 6–20)
CO2: 24 mmol/L (ref 22–32)
Calcium: 9.3 mg/dL (ref 8.9–10.3)
Chloride: 102 mmol/L (ref 101–111)
Creatinine, Ser: 1.9 mg/dL — ABNORMAL HIGH (ref 0.61–1.24)
GFR calc Af Amer: 45 mL/min — ABNORMAL LOW (ref >60)
GFR, EST NON AFRICAN AMERICAN: 39 mL/min — AB (ref >60)
Glucose, Bld: 309 mg/dL — ABNORMAL HIGH (ref 65–99)
POTASSIUM: 4.2 mmol/L (ref 3.5–5.1)
SODIUM: 134 mmol/L — AB (ref 135–145)

## 2015-05-06 LAB — URINE MICROSCOPIC-ADD ON
BACTERIA UA: NONE SEEN
RBC / HPF: NONE SEEN RBC/hpf (ref 0–5)
WBC, UA: NONE SEEN WBC/hpf (ref 0–5)

## 2015-05-06 LAB — CBC
HEMATOCRIT: 43.7 % (ref 39.0–52.0)
HEMOGLOBIN: 15.1 g/dL (ref 13.0–17.0)
MCH: 30.5 pg (ref 26.0–34.0)
MCHC: 34.6 g/dL (ref 30.0–36.0)
MCV: 88.3 fL (ref 78.0–100.0)
Platelets: 219 10*3/uL (ref 150–400)
RBC: 4.95 MIL/uL (ref 4.22–5.81)
RDW: 13.9 % (ref 11.5–15.5)
WBC: 7.3 10*3/uL (ref 4.0–10.5)

## 2015-05-06 LAB — LIPID PANEL
Cholesterol: 195 mg/dL (ref 0–200)
HDL: 29 mg/dL — AB (ref >40)
LDL CALC: 129 mg/dL — AB (ref 0–99)
Total CHOL/HDL Ratio: 6.7 RATIO
Triglycerides: 185 mg/dL — ABNORMAL HIGH (ref <150)
VLDL: 37 mg/dL (ref 0–40)

## 2015-05-06 LAB — HEPATIC FUNCTION PANEL
ALT: 20 U/L (ref 17–63)
AST: 37 U/L (ref 15–41)
Albumin: 3.9 g/dL (ref 3.5–5.0)
Alkaline Phosphatase: 71 U/L (ref 38–126)
BILIRUBIN DIRECT: 0.2 mg/dL (ref 0.1–0.5)
BILIRUBIN INDIRECT: 0.8 mg/dL (ref 0.3–0.9)
TOTAL PROTEIN: 8 g/dL (ref 6.5–8.1)
Total Bilirubin: 1 mg/dL (ref 0.3–1.2)

## 2015-05-06 LAB — I-STAT TROPONIN, ED: Troponin i, poc: 0 ng/mL (ref 0.00–0.08)

## 2015-05-06 LAB — LIPASE, BLOOD: Lipase: 86 U/L — ABNORMAL HIGH (ref 11–51)

## 2015-05-06 LAB — CBG MONITORING, ED: GLUCOSE-CAPILLARY: 157 mg/dL — AB (ref 65–99)

## 2015-05-06 LAB — D-DIMER, QUANTITATIVE (NOT AT ARMC)

## 2015-05-06 MED ORDER — INSULIN GLARGINE 100 UNIT/ML ~~LOC~~ SOLN
15.0000 [IU] | Freq: Every day | SUBCUTANEOUS | Status: DC
  Administered 2015-05-06: 15 [IU] via SUBCUTANEOUS
  Filled 2015-05-06: qty 0.15

## 2015-05-06 MED ORDER — OXYCODONE-ACETAMINOPHEN 5-325 MG PO TABS
2.0000 | ORAL_TABLET | Freq: Four times a day (QID) | ORAL | Status: DC | PRN
  Administered 2015-05-06 – 2015-05-09 (×5): 2 via ORAL
  Filled 2015-05-06 (×5): qty 2

## 2015-05-06 MED ORDER — SODIUM CHLORIDE 0.9% FLUSH
3.0000 mL | Freq: Two times a day (BID) | INTRAVENOUS | Status: DC
  Administered 2015-05-06: 3 mL via INTRAVENOUS

## 2015-05-06 MED ORDER — RIVAROXABAN 20 MG PO TABS
20.0000 mg | ORAL_TABLET | Freq: Every day | ORAL | Status: DC
  Administered 2015-05-06 – 2015-05-09 (×4): 20 mg via ORAL
  Filled 2015-05-06 (×4): qty 1

## 2015-05-06 MED ORDER — ONDANSETRON HCL 4 MG/2ML IJ SOLN
4.0000 mg | Freq: Once | INTRAMUSCULAR | Status: AC
  Administered 2015-05-06: 4 mg via INTRAVENOUS
  Filled 2015-05-06: qty 2

## 2015-05-06 MED ORDER — ONDANSETRON HCL 4 MG/2ML IJ SOLN: 4.0000 mg | Freq: Three times a day (TID) | INTRAMUSCULAR | Status: DC | PRN

## 2015-05-06 MED ORDER — INSULIN ASPART 100 UNIT/ML ~~LOC~~ SOLN
0.0000 [IU] | Freq: Three times a day (TID) | SUBCUTANEOUS | Status: DC
  Administered 2015-05-07: 1 [IU] via SUBCUTANEOUS

## 2015-05-06 MED ORDER — SODIUM CHLORIDE 0.9 % IV BOLUS (SEPSIS)
500.0000 mL | Freq: Once | INTRAVENOUS | Status: AC
  Administered 2015-05-06: 500 mL via INTRAVENOUS

## 2015-05-06 MED ORDER — SODIUM CHLORIDE 0.9 % IV BOLUS (SEPSIS)
1000.0000 mL | Freq: Once | INTRAVENOUS | Status: AC
  Administered 2015-05-06: 1000 mL via INTRAVENOUS

## 2015-05-06 MED ORDER — SODIUM CHLORIDE 0.9 % IV SOLN
INTRAVENOUS | Status: DC
  Administered 2015-05-06 – 2015-05-10 (×8): via INTRAVENOUS

## 2015-05-06 MED ORDER — MORPHINE SULFATE (PF) 2 MG/ML IV SOLN
2.0000 mg | INTRAVENOUS | Status: DC | PRN
  Administered 2015-05-07 – 2015-05-08 (×2): 2 mg via INTRAVENOUS
  Filled 2015-05-06 (×2): qty 1

## 2015-05-06 MED ORDER — MORPHINE SULFATE (PF) 4 MG/ML IV SOLN
4.0000 mg | Freq: Once | INTRAVENOUS | Status: AC
  Administered 2015-05-06: 4 mg via INTRAVENOUS
  Filled 2015-05-06: qty 1

## 2015-05-06 MED ORDER — ACETAMINOPHEN 650 MG RE SUPP: 650.0000 mg | Freq: Four times a day (QID) | RECTAL | Status: DC | PRN

## 2015-05-06 MED ORDER — ACETAMINOPHEN 325 MG PO TABS: 650.0000 mg | ORAL_TABLET | Freq: Four times a day (QID) | ORAL | Status: DC | PRN

## 2015-05-06 NOTE — H&P (Signed)
[facility] History and Physical  Jacob Donaldson XBM:[id number] DOB: [birth year] DOA: 05/06/2015  Referring physician: ED physician PCP: Pcp Not In System  Specialists:   Chief Complaint: Bilateral side pain (lower rib cage and upper abdomen)  HPI: Jacob Donaldson is a 55 y.o. male with PMH of diabetes mellitus, pulmonary embolism on Xarelto, chronic kidney disease-stage II, who presents with bilateral side pain (lower rib cage and upper abdomen).  Patient reports that he has been having bilateral side pain (lower rib cage and upper abdomen) for about one week. It is constant, moderate, nonradiating. He reports he has baseline right lower chest pain because of PE,, but now he has left-sided pain also. He has some mild shortness of breath, but no fever, chills, cough. Patient does not have nausea, vomiting, diarrhea. No symptoms of UTI or unilateral weakness. He does not drink alcohol.  In ED, patient was found to have elevated lipase 86, troponin negative, d-dimer negative, triglyceride 185, negative urinalysis, worsening renal function, negative chest x-ray. Abdominal ultrasound was technically less than optimal examination, but showed diffuse hepatic steatosis and/or hepatocellular disease without focal hepatic parenchymal abnormality, approximate 3.5 cm cyst arising from the upper pole of the left kidney. CT-abd/pelvis did not show acute abnormalities. Patient is admitted to inpatient for further eval and treatment.  EKG: Independently reviewed. QTC 411, no ischemic change  Where does patient live?   At home  Can patient participate in ADLs?  Yes   Review of Systems:   General: no fevers, chills, no changes in body weight, has poor appetite, has fatigue HEENT: no blurry vision, hearing changes or sore throat Pulm: has dyspnea, no coughing, wheezing CV: has chest pain, no palpitations Abd: no nausea, vomiting, has abdominal pain, no diarrhea, constipation GU: no dysuria, burning on  urination, increased urinary frequency, hematuria  Ext: no leg edema Neuro: no unilateral weakness, numbness, or tingling, no vision change or hearing loss Skin: no rash MSK: No muscle spasm, no deformity, no limitation of range of movement in spin Heme: No easy bruising.  Travel history: No recent long distant travel.  Allergy: No Known Allergies  Past Medical History  Diagnosis Date  . Diabetes mellitus without complication (HCC)   . Pulmonary embolism (HCC)   . CKD (chronic kidney disease), stage II     History reviewed. No pertinent past surgical history.  Social History:  reports that he has never smoked. He does not have any smokeless tobacco history on file. He reports that he does not drink alcohol or use illicit drugs.  Family History:  Family History  Problem Relation Age of Onset  . Heart disease Mother   . Diabetes Mellitus II Maternal Grandmother      Prior to Admission medications   Medication Sig Start Date End Date Taking? Authorizing Provider  insulin aspart (NOVOLOG FLEXPEN) 100 UNIT/ML FlexPen Before each meal 3 times a day, 140-199 - 2 units, 200-250 - 4 units, 251-299 - 6 units,  300-349 - 8 units,  350 or above 10 units. Insulin PEN if approved, provide syringes and needles if needed. 10/11/14  Yes Albertine Grates, MD  Insulin Glargine (LANTUS SOLOSTAR) 100 UNIT/ML Solostar Pen Inject 40 Units into the skin daily at 10 pm. Patient taking differently: Inject 20 Units into the skin 2 (two) times daily.  10/11/14  Yes Albertine Grates, MD  rivaroxaban (XARELTO) 20 MG TABS tablet Take 20 mg by mouth daily with supper.   Yes Historical Provider, MD  azithromycin (ZITHROMAX) 250 MG  tablet Take 1 tablet (250 mg total) by mouth daily. 1 tablet daily starting 03/08/15. Patient not taking: Reported on 05/06/2015 03/07/15   Raeford Razor, MD  Rivaroxaban (XARELTO STARTER PACK) 15 & 20 MG TBPK Take as directed on package: Start with one  tablet by mouth twice a day with food. On Day 22,  switch to one  tablet once a day with food. Patient not taking: Reported on 05/06/2015 11/20/14   Calvert Cantor, MD  Rivaroxaban (XARELTO STARTER PACK) 15 & 20 MG TBPK Take as directed on package: Start with one  tablet by mouth twice a day with food. On Day 22, switch to one  tablet once a day with food. Patient not taking: Reported on 05/06/2015 03/07/15   Raeford Razor, MD    Physical Exam: Filed Vitals:   05/06/15 2145 05/06/15 2200 05/06/15 2303 05/07/15 0548  BP:  136/94 133/91 137/82  Pulse: 67 58 59 58  Temp:   97.6 F (36.4 C) 97.5 F (36.4 C)  TempSrc:   Oral Oral  Resp: Height:    (2.007 m)   Weight:   142.52 kg (314 lb 3.2 oz)   SpO2: 97% 100% 99% 100%   General: Not in acute distress HEENT:       Eyes: PERRL, EOMI, no scleral icterus.       ENT: No discharge from the ears and nose, no pharynx injection, no tonsillar enlargement.        Neck: No JVD, no bruit, no mass felt. Heme: No neck lymph node enlargement. Cardiac: S1/S2, RRR, No murmurs, No gallops or rubs. Pulm: No rales, wheezing, rhonchi or rubs. Abd: Soft, nondistended, tenderness over both side of upper abdomen, no rebound pain, no organomegaly, BS present. Ext: No pitting leg edema bilaterally. 2+DP/PT pulse bilaterally. Musculoskeletal: No joint deformities, No joint redness or warmth, no limitation of ROM in spin. Skin: No rashes.  Neuro: Alert, oriented X3, cranial nerves II-XII grossly intact, moves all extremities normally.  Psych: Patient is not psychotic, no suicidal or hemocidal ideation.  Labs on Admission:  Basic Metabolic Panel:  Recent Labs Lab 05/06/15 1302 05/07/15 0504  NA 134* 136  K 4.2 4.0  CL 102 105  CO2 24 23  GLUCOSE 309* 122*  BUN 18 15  CREATININE 1.90* 1.27*  CALCIUM 9.3 8.3*   Liver Function Tests:  Recent Labs Lab 05/06/15 1952  AST 37  ALT 20  ALKPHOS 71  BILITOT 1.0  PROT 8.0  ALBUMIN 3.9    Recent Labs Lab 05/06/15 1302   LIPASE 86*   No results for input(s): AMMONIA in the last 168 hours. CBC:  Recent Labs Lab 05/06/15 1302 05/07/15 0504  WBC 7.3 7.7  HGB 15.1 13.4  HCT 43.7 40.9  MCV 88.3 90.3  PLT 219 204   Cardiac Enzymes: No results for input(s): CKTOTAL, CKMB, CKMBINDEX, TROPONINI in the last 168 hours.  BNP (last 3 results)  Recent Labs  11/19/14 2310  BNP 35.3    ProBNP (last 3 results) No results for input(s): PROBNP in the last 8760 hours.  CBG:  Recent Labs Lab 05/06/15 1924  GLUCAP 157*    Radiological Exams on Admission: Ct Abdomen Pelvis Wo Contrast  05/06/2015  CLINICAL DATA:  Upper abdominal pain for 1 week. EXAM: CT ABDOMEN AND PELVIS WITHOUT CONTRAST TECHNIQUE: Multidetector CT imaging of the abdomen and pelvis was performed following the standard protocol without IV contrast. COMPARISON:  Ultrasound earlier this  day.  Chest CT 03/07/2015 FINDINGS: Lower chest: Bibasilar atelectasis, right greater than left. Trace right pleural effusion, improved from prior chest CT. Liver: Decreased density consistent with steatosis. No focal lesion. Hepatobiliary: Gallbladder physiologically distended, no calcified stone. No biliary dilatation. Pancreas: No ductal dilatation or inflammation. Spleen: Normal. Adrenal glands: No nodule. Kidneys: No hydronephrosis or renal stones. No perinephric stranding. Simple cysts in the upper left kidney. Stomach/Bowel: Stomach physiologically distended. Enteric contrast in the distal esophagus. There are no dilated or thickened small bowel loops. Small volume of stool throughout the colon without colonic wall thickening. The appendix is normal. Vascular/Lymphatic: No retroperitoneal adenopathy. Small bilateral inguinal lymph nodes. Abdominal aorta is normal in caliber. Mild atherosclerosis without aneurysm. Reproductive: Normal for age. Bladder: Physiologically distended, no wall thickening. Other: No free air, free fluid, or intra-abdominal fluid  collection. Musculoskeletal: There are no acute or suspicious osseous abnormalities. Degenerative disc disease at L4-L5. IMPRESSION: 1. Enteric contrast in the distal esophagus, may be slow transit versus reflux. 2. Otherwise no acute abnormality in the abdomen/pelvis. Electronically Signed   By: Rubye Oaks M.D.   On: 05/06/2015 23:12   Dg Chest 2 View  05/06/2015  CLINICAL DATA:  Lower chest and upper abdominal pain for the past week. Shortness of breath. EXAM: CHEST  2 VIEW COMPARISON:  Chest CT 03/07/2015 FINDINGS: Heart and mediastinal contours are within normal limits. No focal opacities or effusions. No acute bony abnormality. IMPRESSION: No active cardiopulmonary disease. Electronically Signed   By: Charlett Nose M.D.   On: 05/06/2015 13:37   US Abdomen Complete  05/06/2015  CLINICAL DATA:  55 year old presenting with 1 week history of right upper quadrant abdominal pain and bilateral flank pain. EXAM: ABDOMEN ULTRASOUND COMPLETE COMPARISON:  None. FINDINGS: Technically difficult examination due to patient body habitus and abundant bowel gas. Gallbladder: No shadowing gallstones or echogenic sludge. No gallbladder wall thickening or pericholecystic fluid. Negative sonographic Murphy sign according to the ultrasound technologist. Common bile duct: Diameter: Approximately 5 mm centrally. Mid and distal duct obscured by duodenal bowel gas. Liver: Diffusely increased and coarsened echotexture without focal hepatic parenchymal abnormality. Patent portal vein with hepatopetal flow. IVC: Patent in its intrahepatic portion. Obscured outside the liver by bowel gas. Pancreas: Not visualized due to midline bowel gas. Spleen: Normal size and echotexture without focal parenchymal abnormality. Right Kidney: Length: Approximately 11.2 cm. No hydronephrosis. Well-preserved cortex. No shadowing calculi. Normal parenchymal echotexture. No focal parenchymal abnormality. Left Kidney: Length: Approximately 10.9 cm. No  hydronephrosis. Well-preserved cortex. Circumscribed nearly anechoic mass with acoustic enhancement and no internal color Doppler flow involving the upper pole measuring approximately 3.5 cm. No significant focal parenchymal abnormality. No shadowing calculi. Abdominal aorta: Obscured by midline bowel gas. Other findings: None. IMPRESSION: 1. Technically less than optimal examination due to abundant bowel gas which obscured the pancreas, extrahepatic IVC, abdominal aorta and mid and distal common bile duct. 2. Diffuse hepatic steatosis and/or hepatocellular disease without focal hepatic parenchymal abnormality. 3. Approximate 3.5 cm cyst arising from the upper pole of the left kidney. Electronically Signed   By: Hulan Saas M.D.   On: 05/06/2015 16:29    Assessment/Plan Principal Problem:   Pancreatitis Active Problems:   DKA, type 2 (HCC)   Pulmonary embolism (HCC)   Diabetes mellitus without complication (HCC)   AKI (acute kidney injury) (HCC)   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   Abdominal pain   Possible Pancreatitis: Patient's abdominal pain is likely related to the mild pancreatitis given elevated  lipase. Patient does not drink alcohol. His triglyceride level is 185. Unclear etiology. CT abdomen did not show acute abnormalities. Abdominal ultrasound was not optimal due to technique problems.  -will admit to tele bed (due to PE) -NPO -IVF: Normal saline 1.5 L, then 1 25 mL per hour -When necessary Zofran for nausea, morphine and Percocet for pain  Hx of PE: on Xarelot. D-dimer negative today, indicating less likely to have a new PE. -Pain control as above -Continue  DM-II: Last A1c 13.6 on 10/08/14, poorly controled. Patient is taking Lantus at home -will decrease Lantus dose from 20 units to 15 units daily   -SSI  AoCKD-II: Baseline Cre is 1.0-1.2, his Cre 1.9 and BUN 18 is on admission. Likely due to prerenal secondary to dehydration. - IVF as above - Check FeNa - Follow  up renal function by BMP  DVT ppx: on Xarelto  Code Status: Full code Family Communication: None at bed side.  Disposition Plan: Admit to inpatient   Date of Service 05/07/2015    Lorretta Harp [facility] Pager [phone]  If 7PM-7AM, please contact night-coverage [email or web address] Password [facility] 05/07/2015, 6:33 AM

## 2015-05-06 NOTE — ED Notes (Signed)
Pt to have CT scan of abdomen first prior to transfer to floor unit.

## 2015-05-06 NOTE — ED Provider Notes (Signed)
CSN: [id number]     Arrival date & time 05/06/15  1218 History   First MD Initiated Contact with Patient 05/06/15 1501     Chief Complaint  Patient presents with  . Weakness  . Shortness of Breath   Jacob Donaldson is a 55 y.o. male with a history of diabetes and PE on Xarelto who presents to the emergency department complaining of one week of right upper quadrant abdominal pain. He also reports intermittent shortness of breath. He denies current shortness of breath. He denies any chest pain. He currently complains of 5 out of 10 right upper quadrant constant abdominal pain. He reports sometimes this radiates to his left upper quadrant. He reports feeling fatigued. He reports his blood sugars have been more elevated recently. His blood sugar was 430 this morning. He has been compliant with Xarelto. He has no previous abdominal surgeries. He denies fevers, chest pain, wheezing, chest tightness, nausea, vomiting, diarrhea, burping, belching, or rashes.   The history is provided by the patient. No language interpreter was used.    Past Medical History  Diagnosis Date  . Diabetes mellitus without complication (HCC)   . Pulmonary embolism (HCC)   . CKD (chronic kidney disease), stage II    History reviewed. No pertinent past surgical history. Family History  Problem Relation Age of Onset  . Heart disease Mother   . Diabetes Mellitus II Maternal Grandmother    Social History  Substance Use Topics  . Smoking status: Never Smoker   . Smokeless tobacco: None  . Alcohol Use: No    Review of Systems  Constitutional: Negative for fever and chills.  HENT: Negative for congestion and sore throat.   Eyes: Negative for visual disturbance.  Respiratory: Negative for cough, shortness of breath and wheezing.   Cardiovascular: Negative for chest pain, palpitations and leg swelling.  Gastrointestinal: Positive for abdominal pain. Negative for nausea, vomiting, diarrhea and blood in stool.   Genitourinary: Positive for frequency. Negative for dysuria, urgency and difficulty urinating.  Musculoskeletal: Negative for back pain and neck pain.  Skin: Negative for rash.  Neurological: Negative for dizziness, syncope, light-headedness and headaches.      Allergies  Review of patient's allergies indicates no known allergies.  Home Medications   Prior to Admission medications   Medication Sig Start Date End Date Taking? Authorizing Provider  insulin aspart (NOVOLOG FLEXPEN) 100 UNIT/ML FlexPen Before each meal 3 times a day, 140-199 - 2 units, 200-250 - 4 units, 251-299 - 6 units,  300-349 - 8 units,  350 or above 10 units. Insulin PEN if approved, provide syringes and needles if needed. 10/11/14  Yes Albertine Grates, MD  Insulin Glargine (LANTUS SOLOSTAR) 100 UNIT/ML Solostar Pen Inject 40 Units into the skin daily at 10 pm. Patient taking differently: Inject 20 Units into the skin 2 (two) times daily.  10/11/14  Yes Albertine Grates, MD  rivaroxaban (XARELTO) 20 MG TABS tablet Take 20 mg by mouth daily with supper.   Yes Historical Provider, MD  azithromycin (ZITHROMAX) 250 MG tablet Take 1 tablet (250 mg total) by mouth daily. 1 tablet daily starting 03/08/15. Patient not taking: Reported on 05/06/2015 03/07/15   Raeford Razor, MD  Rivaroxaban (XARELTO STARTER PACK) 15 & 20 MG TBPK Take as directed on package: Start with one  tablet by mouth twice a day with food. On Day 22, switch to one  tablet once a day with food. Patient not taking: Reported on 05/06/2015 11/20/14   Calvert Cantor,  MD  Rivaroxaban (XARELTO STARTER PACK) 15 & 20 MG TBPK Take as directed on package: Start with one  tablet by mouth twice a day with food. On Day 22, switch to one  tablet once a day with food. Patient not taking: Reported on 05/06/2015 03/07/15   Raeford Razor, MD   BP 133/91 mmHg  Pulse 59  Temp(Src) 97.6 F (36.4 C) (Oral)  Resp 16  Ht  (2.007 m)  Wt 142.52 kg  BMI 35.38 kg/m2  SpO2  99% Physical Exam  Constitutional: He is oriented to person, place, and time. He appears well-developed and well-nourished. No distress.  Nontoxic-appearing.  HENT:  Head: Normocephalic and atraumatic.  Mouth/Throat: Oropharynx is clear and moist.  Eyes: Conjunctivae are normal. Pupils are equal, round, and reactive to light. Right eye exhibits no discharge. Left eye exhibits no discharge.  Neck: Normal range of motion. Neck supple. No JVD present. No tracheal deviation present.  Cardiovascular: Normal rate, regular rhythm, normal heart sounds and intact distal pulses.  Exam reveals no gallop and no friction rub.   No murmur heard. Bilateral radial pulses are intact.  Pulmonary/Chest: Effort normal and breath sounds normal. No respiratory distress. He has no wheezes. He has no rales. He exhibits no tenderness.  Lungs are clear to auscultation bilaterally.   Abdominal: Soft. Bowel sounds are normal. He exhibits no distension and no mass. There is tenderness. There is no rebound and no guarding.  Patient has right upper quadrant, epigastric and right flank tenderness to palpation. No peritoneal signs. No lower abdominal pain.  Musculoskeletal: He exhibits no edema or tenderness.  No lower extremity edema or tenderness.  Lymphadenopathy:    He has no cervical adenopathy.  Neurological: He is alert and oriented to person, place, and time. Coordination normal.  Skin: Skin is warm and dry. No rash noted. He is not diaphoretic. No erythema. No pallor.  Psychiatric: He has a normal mood and affect. His behavior is normal.  Nursing note and vitals reviewed.   ED Course  Procedures (including critical care time) Labs Review Labs Reviewed  BASIC METABOLIC PANEL - Abnormal; Notable for the following:    Sodium 134 (*)    Glucose, Bld 309 (*)    Creatinine, Ser 1.90 (*)    GFR calc non Af Amer 39 (*)    GFR calc Af Amer 45 (*)    All other components within normal limits  LIPID PANEL -  Abnormal; Notable for the following:    Triglycerides 185 (*)    HDL 29 (*)    LDL Cholesterol 129 (*)    All other components within normal limits  LIPASE, BLOOD - Abnormal; Notable for the following:    Lipase 86 (*)    All other components within normal limits  URINALYSIS, ROUTINE W REFLEX MICROSCOPIC (NOT AT [facility]) - Abnormal; Notable for the following:    Glucose, UA >1000 (*)    All other components within normal limits  URINE MICROSCOPIC-ADD ON - Abnormal; Notable for the following:    Squamous Epithelial / LPF 0-5 (*)    All other components within normal limits  CBG MONITORING, ED - Abnormal; Notable for the following:    Glucose-Capillary 157 (*)    All other components within normal limits  CBC  D-DIMER, QUANTITATIVE (NOT AT [facility])  HEPATIC FUNCTION PANEL  PROTIME-INR  APTT  CREATININE, URINE, RANDOM  SODIUM, URINE, RANDOM  CBC  BASIC METABOLIC PANEL  I-STAT TROPOININ, ED  Imaging Review Ct Abdomen Pelvis Wo Contrast  05/06/2015  CLINICAL DATA:  Upper abdominal pain for 1 week. EXAM: CT ABDOMEN AND PELVIS WITHOUT CONTRAST TECHNIQUE: Multidetector CT imaging of the abdomen and pelvis was performed following the standard protocol without IV contrast. COMPARISON:  Ultrasound earlier this day.  Chest CT 03/07/2015 FINDINGS: Lower chest: Bibasilar atelectasis, right greater than left. Trace right pleural effusion, improved from prior chest CT. Liver: Decreased density consistent with steatosis. No focal lesion. Hepatobiliary: Gallbladder physiologically distended, no calcified stone. No biliary dilatation. Pancreas: No ductal dilatation or inflammation. Spleen: Normal. Adrenal glands: No nodule. Kidneys: No hydronephrosis or renal stones. No perinephric stranding. Simple cysts in the upper left kidney. Stomach/Bowel: Stomach physiologically distended. Enteric contrast in the distal esophagus. There are no dilated or thickened small bowel loops. Small volume of stool throughout  the colon without colonic wall thickening. The appendix is normal. Vascular/Lymphatic: No retroperitoneal adenopathy. Small bilateral inguinal lymph nodes. Abdominal aorta is normal in caliber. Mild atherosclerosis without aneurysm. Reproductive: Normal for age. Bladder: Physiologically distended, no wall thickening. Other: No free air, free fluid, or intra-abdominal fluid collection. Musculoskeletal: There are no acute or suspicious osseous abnormalities. Degenerative disc disease at L4-L5. IMPRESSION: 1. Enteric contrast in the distal esophagus, may be slow transit versus reflux. 2. Otherwise no acute abnormality in the abdomen/pelvis. Electronically Signed   By: Rubye Oaks M.D.   On: 05/06/2015 23:12   Dg Chest 2 View  05/06/2015  CLINICAL DATA:  Lower chest and upper abdominal pain for the past week. Shortness of breath. EXAM: CHEST  2 VIEW COMPARISON:  Chest CT 03/07/2015 FINDINGS: Heart and mediastinal contours are within normal limits. No focal opacities or effusions. No acute bony abnormality. IMPRESSION: No active cardiopulmonary disease. Electronically Signed   By: Charlett Nose M.D.   On: 05/06/2015 13:37   US Abdomen Complete  05/06/2015  CLINICAL DATA:  55 year old presenting with 1 week history of right upper quadrant abdominal pain and bilateral flank pain. EXAM: ABDOMEN ULTRASOUND COMPLETE COMPARISON:  None. FINDINGS: Technically difficult examination due to patient body habitus and abundant bowel gas. Gallbladder: No shadowing gallstones or echogenic sludge. No gallbladder wall thickening or pericholecystic fluid. Negative sonographic Murphy sign according to the ultrasound technologist. Common bile duct: Diameter: Approximately 5 mm centrally. Mid and distal duct obscured by duodenal bowel gas. Liver: Diffusely increased and coarsened echotexture without focal hepatic parenchymal abnormality. Patent portal vein with hepatopetal flow. IVC: Patent in its intrahepatic portion. Obscured  outside the liver by bowel gas. Pancreas: Not visualized due to midline bowel gas. Spleen: Normal size and echotexture without focal parenchymal abnormality. Right Kidney: Length: Approximately 11.2 cm. No hydronephrosis. Well-preserved cortex. No shadowing calculi. Normal parenchymal echotexture. No focal parenchymal abnormality. Left Kidney: Length: Approximately 10.9 cm. No hydronephrosis. Well-preserved cortex. Circumscribed nearly anechoic mass with acoustic enhancement and no internal color Doppler flow involving the upper pole measuring approximately 3.5 cm. No significant focal parenchymal abnormality. No shadowing calculi. Abdominal aorta: Obscured by midline bowel gas. Other findings: None. IMPRESSION: 1. Technically less than optimal examination due to abundant bowel gas which obscured the pancreas, extrahepatic IVC, abdominal aorta and mid and distal common bile duct. 2. Diffuse hepatic steatosis and/or hepatocellular disease without focal hepatic parenchymal abnormality. 3. Approximate 3.5 cm cyst arising from the upper pole of the left kidney. Electronically Signed   By: Hulan Saas M.D.   On: 05/06/2015 16:29   I have personally reviewed and evaluated these images and lab results as  part of my medical decision-making.   EKG Interpretation   Date/Time:  Saturday May 06 2015 12:59:39 EST Ventricular Rate:  88 PR Interval:  155 QRS Duration: 85 QT Interval:  340 QTC Calculation: 411 R Axis:   20 Text Interpretation:  Sinus rhythm No significant change since last  tracing Confirmed by Veracruz  MD, ANTHONY ([id number]) on 05/06/2015 5:39:04 PM      Filed Vitals:   05/06/15 2115 05/06/15 2145 05/06/15 2200 05/06/15 2303  BP:   136/94 133/91  Pulse: 64 67 58 59  Temp:    97.6 F (36.4 C)  TempSrc:    Oral  Resp:  22 17 16   Height:    6\' 7"  (2.007 m)  Weight:    142.52 kg  SpO2: 99% 97% 100% 99%     MDM   Meds given in ED:  Medications  morphine 2 MG/ML injection 2 mg  (not administered)  ondansetron (ZOFRAN) injection 4 mg (not administered)  rivaroxaban (XARELTO) tablet 20 mg (20 mg Oral Given 05/06/15 2329)  insulin glargine (LANTUS) injection 15 Units (15 Units Subcutaneous Given 05/06/15 2330)  insulin aspart (novoLOG) injection 0-9 Units (not administered)  0.9 %  sodium chloride infusion ( Intravenous New Bag/Given 05/06/15 2331)  oxyCODONE-acetaminophen (PERCOCET/ROXICET) 5-325 MG per tablet 2 tablet (2 tablets Oral Given 05/06/15 2329)  sodium chloride flush (NS) 0.9 % injection 3 mL (3 mLs Intravenous Given 05/06/15 2345)  acetaminophen (TYLENOL) tablet 650 mg (not administered)    Or  acetaminophen (TYLENOL) suppository 650 mg (not administered)  sodium chloride 0.9 % bolus 500 mL (0 mLs Intravenous Stopped 05/06/15 1806)  ondansetron (ZOFRAN) injection 4 mg (4 mg Intravenous Given 05/06/15 1626)  morphine 4 MG/ML injection 4 mg (4 mg Intravenous Given 05/06/15 1625)  sodium chloride 0.9 % bolus 1,000 mL (0 mLs Intravenous Stopped 05/06/15 2136)  morphine 4 MG/ML injection 4 mg (4 mg Intravenous Given 05/06/15 1949)    Current Discharge Medication List      Final diagnoses:  Abdominal pain  #1: Acute Kidney injury #2: elevated lipase #3: Right flank pain   This is a 55 y.o. male with a history of diabetes and PE on Xarelto who presents to the emergency department complaining of one week of right upper quadrant abdominal pain. He also reports intermittent shortness of breath. He denies current shortness of breath. He denies any chest pain. He currently complains of 5 out of 10 right upper quadrant constant abdominal pain. He reports sometimes this radiates to his left upper quadrant. He reports feeling fatigued. He reports his blood sugars have been more elevated recently. His blood sugar was 430 this morning. He has been compliant with Xarelto. On exam the patient is afebrile nontoxic appearing. His lungs are good auscultation bilaterally. Patient has  right upper quadrant and right flank tenderness to palpation. As well as mild epigastric abdominal tenderness to palpation. Urinalysis is negative for infection. Indicate glucosuria. Troponin is 0. D-dimer is not elevated. BMP reveals a glucose of 309. Patient has elevated creatinine 1.90. This is a increased from his last blood work which indicated a creatinine of 1.16. Patient with acute kidney injury. Lipase is 86 and elevated. Abdominal ultrasound was limited due to bowel gas. It showed hepatic steatosis. No acute findings with the gallbladder.  Will admit for AKI and elevated lipase. Patient is in agreement with admission.       Everlene Farrier, PA-C 05/07/15 0256  Lorre Nick, MD 05/07/15 2141

## 2015-05-06 NOTE — ED Notes (Signed)
Pt reports pain at border between abd and chest area for the past week accompanied by SOB. Pain currently on R side, but has been on the L side. Pt also reports intermittent blurred vision. Hx of PEs and DM.

## 2015-05-07 ENCOUNTER — Observation Stay (HOSPITAL_COMMUNITY): Payer: 59

## 2015-05-07 ENCOUNTER — Encounter (HOSPITAL_COMMUNITY): Payer: Self-pay

## 2015-05-07 DIAGNOSIS — K8500 Idiopathic acute pancreatitis without necrosis or infection: Secondary | ICD-10-CM | POA: Diagnosis not present

## 2015-05-07 DIAGNOSIS — R1011 Right upper quadrant pain: Secondary | ICD-10-CM

## 2015-05-07 DIAGNOSIS — N179 Acute kidney failure, unspecified: Secondary | ICD-10-CM | POA: Diagnosis not present

## 2015-05-07 DIAGNOSIS — E119 Type 2 diabetes mellitus without complications: Secondary | ICD-10-CM | POA: Diagnosis not present

## 2015-05-07 DIAGNOSIS — N189 Chronic kidney disease, unspecified: Secondary | ICD-10-CM | POA: Diagnosis not present

## 2015-05-07 LAB — BASIC METABOLIC PANEL
Anion gap: 8 (ref 5–15)
BUN: 15 mg/dL (ref 6–20)
CO2: 23 mmol/L (ref 22–32)
CREATININE: 1.27 mg/dL — AB (ref 0.61–1.24)
Calcium: 8.3 mg/dL — ABNORMAL LOW (ref 8.9–10.3)
Chloride: 105 mmol/L (ref 101–111)
GFR calc Af Amer: >60 mL/min (ref >60)
GLUCOSE: 122 mg/dL — AB (ref 65–99)
Potassium: 4 mmol/L (ref 3.5–5.1)
SODIUM: 136 mmol/L (ref 135–145)

## 2015-05-07 LAB — PROTIME-INR
INR: 1.17 (ref 0.00–1.49)
PROTHROMBIN TIME: 14.6 s (ref 11.6–15.2)

## 2015-05-07 LAB — CBC
HCT: 40.9 % (ref 39.0–52.0)
Hemoglobin: 13.4 g/dL (ref 13.0–17.0)
MCH: 29.6 pg (ref 26.0–34.0)
MCHC: 32.8 g/dL (ref 30.0–36.0)
MCV: 90.3 fL (ref 78.0–100.0)
PLATELETS: 204 10*3/uL (ref 150–400)
RBC: 4.53 MIL/uL (ref 4.22–5.81)
RDW: 14.2 % (ref 11.5–15.5)
WBC: 7.7 10*3/uL (ref 4.0–10.5)

## 2015-05-07 LAB — CREATININE, URINE, RANDOM: Creatinine, Urine: 207.31 mg/dL

## 2015-05-07 LAB — LIPASE, BLOOD: Lipase: 212 U/L — ABNORMAL HIGH (ref 11–51)

## 2015-05-07 LAB — SODIUM, URINE, RANDOM: Sodium, Ur: 107 mmol/L

## 2015-05-07 LAB — GLUCOSE, CAPILLARY
GLUCOSE-CAPILLARY: 128 mg/dL — AB (ref 65–99)
Glucose-Capillary: 102 mg/dL — ABNORMAL HIGH (ref 65–99)
Glucose-Capillary: 86 mg/dL (ref 65–99)

## 2015-05-07 LAB — APTT: aPTT: 25 seconds (ref 24–37)

## 2015-05-07 MED ORDER — INSULIN GLARGINE 100 UNIT/ML ~~LOC~~ SOLN
15.0000 [IU] | Freq: Two times a day (BID) | SUBCUTANEOUS | Status: DC
  Administered 2015-05-07 – 2015-05-10 (×6): 15 [IU] via SUBCUTANEOUS
  Filled 2015-05-07 (×9): qty 0.15

## 2015-05-07 MED ORDER — IOHEXOL 350 MG/ML SOLN
100.0000 mL | Freq: Once | INTRAVENOUS | Status: AC | PRN
  Administered 2015-05-07: 100 mL via INTRAVENOUS

## 2015-05-07 MED ORDER — INSULIN ASPART 100 UNIT/ML ~~LOC~~ SOLN
0.0000 [IU] | SUBCUTANEOUS | Status: DC
  Administered 2015-05-08: 1 [IU] via SUBCUTANEOUS
  Administered 2015-05-09: 2 [IU] via SUBCUTANEOUS
  Administered 2015-05-09: 1 [IU] via SUBCUTANEOUS

## 2015-05-07 NOTE — Progress Notes (Signed)
[facility] Progress Note   Jacob Donaldson  ZOX:[id number]  DOB: 09/24/1960  DOA: 05/06/2015 PCP: Pcp Not In System  Brief narrative: Jacob Donaldson is a 55 y.o. male with diabetes mellitus, pulmonary emboli on Xarelto, chronic kidney disease stage II presents to the hospital for pain that started in his right upper quadrant and then radiated to his left upper quadrant and has been present for about 1 week. He noticed yesterday that his sugar went from 100-200 which is his baseline to 400 which worried him and he therefore presented to the hospital. Workup revealed an elevated lipase and mild acute renal failure. CT scan was negative for pancreatitis or any other abdominal etiology. Ultrasound revealed diffuse hepatic steatosis and normal gallbladder with negative Murphy sign.   Subjective: Continues to have pain across upper abdomen but mostly in the right upper quadrant. No nausea vomiting or diarrhea. No cough or shortness of breath.  Assessment/Plan: Principal Problem:   Abdominal pain -This is mostly right upper quadrant pain radiating across to his left upper quadrant -He states this pain is similar to the pain he had when he was found to have PE recently-I have obtained a CT of the chest which shows a chronic PE -The only possible etiology can see at this time is acute pancreatitis, although, CT does not show an inflamed pancreas- is noted that his lipase has recent from 86 to 212- no etiology for pancreatitis found-he is not an alcoholic, he has no gallstones, triglyceride level is normal  Active Problems: Acute renal failure -pre- renal-improving with IV fluids    Pulmonary embolism (HCC) -Continue Xarelto    Diabetes mellitus without complication (HCC) -Continue Lantus and NovoLog sliding scale-sugars are better controlled now -Suspect rise in blood sugar may also be secondary to acute pancreatitis  Antibiotics: Anti-infectives    None     Code Status:     Code  Status Orders        Start     Ordered   05/06/15 2252  Full code   Continuous     05/06/15 2252    Code Status History    Date Active Date Inactive Code Status Order ID Comments User Context   11/20/2014  2:27 AM 11/20/2014  4:52 PM Full Code [id number]  Bobette Mo, MD Inpatient   10/08/2014  4:03 PM 10/11/2014  9:01 PM Full Code [id number]  Eddie North, MD Inpatient     Family Communication: Disposition Plan: Nothing by mouth until lipase improves and abdominal symptoms resolve DVT prophylaxis: Xarelto Consultants:  Procedures:     Objective: Filed Weights   05/06/15 2303  Weight: 142.52 kg (314 lb 3.2 oz)    Intake/Output Summary (Last 24 hours) at 05/07/15 1551 Last data filed at 05/07/15 0600  Gross per 24 hour  Intake 1810.42 ml  Output      0 ml  Net 1810.42 ml     Vitals Filed Vitals:   05/06/15 2145 05/06/15 2200 05/06/15 2303 05/07/15 0548  BP:  136/94 133/91 137/82  Pulse: 67 58 59 58  Temp:   97.6 F (36.4 C) 97.5 F (36.4 C)  TempSrc:   Oral Oral  Resp: 22 17 16 16   Height:   6\' 7"  (2.007 m)   Weight:   142.52 kg (314 lb 3.2 oz)   SpO2: 97% 100% 99% 100%    Exam:  General:  Pt is alert, not in acute distress  HEENT: No icterus, No thrush, oral mucosa moist  Cardiovascular: regular rate and rhythm, S1/S2 No murmur  Respiratory: clear to auscultation bilaterally   Abdomen: Soft, +Bowel sounds, tender in right upper quadrant, epigastrium and left upper quadrant, non distended, no guarding  MSK: No cyanosis or clubbing- no pedal edema   Data Reviewed: Basic Metabolic Panel:  Recent Labs Lab 05/06/15 1302 05/07/15 0504  NA 134* 136  K 4.2 4.0  CL 102 105  CO2 24 23  GLUCOSE 309* 122*  BUN 18 15  CREATININE 1.90* 1.27*  CALCIUM 9.3 8.3*   Liver Function Tests:  Recent Labs Lab 05/06/15 1952  AST 37  ALT 20  ALKPHOS 71  BILITOT 1.0  PROT 8.0  ALBUMIN 3.9    Recent Labs Lab 05/06/15 1302 05/07/15 0501  LIPASE  86* 212*   No results for input(s): AMMONIA in the last 168 hours. CBC:  Recent Labs Lab 05/06/15 1302 05/07/15 0504  WBC 7.3 7.7  HGB 15.1 13.4  HCT 43.7 40.9  MCV 88.3 90.3  PLT 219 204   Cardiac Enzymes: No results for input(s): CKTOTAL, CKMB, CKMBINDEX, TROPONINI in the last 168 hours. BNP (last 3 results)  Recent Labs  11/19/14 2310  BNP 35.3    ProBNP (last 3 results) No results for input(s): PROBNP in the last 8760 hours.  CBG:  Recent Labs Lab 05/06/15 1924 05/07/15 0759 05/07/15 1226  GLUCAP 157* 128* 102*    No results found for this or any previous visit (from the past 240 hour(s)).   Studies: Ct Abdomen Pelvis Wo Contrast  05/06/2015  CLINICAL DATA:  Upper abdominal pain for 1 week. EXAM: CT ABDOMEN AND PELVIS WITHOUT CONTRAST TECHNIQUE: Multidetector CT imaging of the abdomen and pelvis was performed following the standard protocol without IV contrast. COMPARISON:  Ultrasound earlier this day.  Chest CT 03/07/2015 FINDINGS: Lower chest: Bibasilar atelectasis, right greater than left. Trace right pleural effusion, improved from prior chest CT. Liver: Decreased density consistent with steatosis. No focal lesion. Hepatobiliary: Gallbladder physiologically distended, no calcified stone. No biliary dilatation. Pancreas: No ductal dilatation or inflammation. Spleen: Normal. Adrenal glands: No nodule. Kidneys: No hydronephrosis or renal stones. No perinephric stranding. Simple cysts in the upper left kidney. Stomach/Bowel: Stomach physiologically distended. Enteric contrast in the distal esophagus. There are no dilated or thickened small bowel loops. Small volume of stool throughout the colon without colonic wall thickening. The appendix is normal. Vascular/Lymphatic: No retroperitoneal adenopathy. Small bilateral inguinal lymph nodes. Abdominal aorta is normal in caliber. Mild atherosclerosis without aneurysm. Reproductive: Normal for age. Bladder: Physiologically  distended, no wall thickening. Other: No free air, free fluid, or intra-abdominal fluid collection. Musculoskeletal: There are no acute or suspicious osseous abnormalities. Degenerative disc disease at L4-L5. IMPRESSION: 1. Enteric contrast in the distal esophagus, may be slow transit versus reflux. 2. Otherwise no acute abnormality in the abdomen/pelvis. Electronically Signed   By: Rubye Oaks M.D.   On: 05/06/2015 23:12   Dg Chest 2 View  05/06/2015  CLINICAL DATA:  Lower chest and upper abdominal pain for the past week. Shortness of breath. EXAM: CHEST  2 VIEW COMPARISON:  Chest CT 03/07/2015 FINDINGS: Heart and mediastinal contours are within normal limits. No focal opacities or effusions. No acute bony abnormality. IMPRESSION: No active cardiopulmonary disease. Electronically Signed   By: Charlett Nose M.D.   On: 05/06/2015 13:37   Ct Angio Chest Pe W/cm &/or Wo Cm  05/07/2015  CLINICAL DATA:  Chest pain and history of pulmonary embolism EXAM: CT ANGIOGRAPHY CHEST CT  ABDOMEN AND PELVIS WITH CONTRAST TECHNIQUE: Multidetector CT imaging of the chest was performed using the standard protocol during bolus administration of intravenous contrast. Multiplanar CT image reconstructions and MIPs were obtained to evaluate the vascular anatomy. Multidetector CT imaging of the abdomen and pelvis was performed using the standard protocol during bolus administration of intravenous contrast. CONTRAST:  OMNIPAQUE IOHEXOL 350 MG/ML SOLN COMPARISON:  03/07/2015 FINDINGS: CTA CHEST FINDINGS The lungs are well aerated bilaterally without focal infiltrate or sizable parenchymal nodule. A small right-sided pleural effusion is seen. Minimal right basilar atelectasis is noted. The thoracic inlet is within normal limits. The thoracic aorta shows no aneurysmal dilatation or dissection. Its branches are within normal limits. The pulmonary artery demonstrates a normal branching pattern. No acute pulmonary emboli are seen.  Some mild persistent filling defect in the right lower lobe branches is seen likely related to chronic pulmonary embolism. No acute bony abnormality is seen. CT ABDOMEN and PELVIS FINDINGS The liver, gallbladder, spleen, adrenal glands and pancreas are all normal in their CT appearance. Kidneys are well visualized bilaterally and demonstrate a normal enhancement pattern. A large parapelvic cyst is noted on the left measuring 4.4 cm. No obstructive changes are seen. Aortoiliac calcifications are noted. Bladder is well distended. No pelvic mass lesion or sidewall abnormality is seen. The appendix is within normal limits. Review of the MIP images confirms the above findings. IMPRESSION: Stable likely chronic pulmonary embolism in the right lower lobe branches. No new focal emboli are noted. Mild right basilar atelectasis with small effusion Left renal cyst. No other focal abnormality is seen. Electronically Signed   By: Alcide Clever M.D.   On: 05/07/2015 15:23   US Abdomen Complete  05/06/2015  CLINICAL DATA:  55 year old presenting with 1 week history of right upper quadrant abdominal pain and bilateral flank pain. EXAM: ABDOMEN ULTRASOUND COMPLETE COMPARISON:  None. FINDINGS: Technically difficult examination due to patient body habitus and abundant bowel gas. Gallbladder: No shadowing gallstones or echogenic sludge. No gallbladder wall thickening or pericholecystic fluid. Negative sonographic Murphy sign according to the ultrasound technologist. Common bile duct: Diameter: Approximately 5 mm centrally. Mid and distal duct obscured by duodenal bowel gas. Liver: Diffusely increased and coarsened echotexture without focal hepatic parenchymal abnormality. Patent portal vein with hepatopetal flow. IVC: Patent in its intrahepatic portion. Obscured outside the liver by bowel gas. Pancreas: Not visualized due to midline bowel gas. Spleen: Normal size and echotexture without focal parenchymal abnormality. Right Kidney:  Length: Approximately 11.2 cm. No hydronephrosis. Well-preserved cortex. No shadowing calculi. Normal parenchymal echotexture. No focal parenchymal abnormality. Left Kidney: Length: Approximately 10.9 cm. No hydronephrosis. Well-preserved cortex. Circumscribed nearly anechoic mass with acoustic enhancement and no internal color Doppler flow involving the upper pole measuring approximately 3.5 cm. No significant focal parenchymal abnormality. No shadowing calculi. Abdominal aorta: Obscured by midline bowel gas. Other findings: None. IMPRESSION: 1. Technically less than optimal examination due to abundant bowel gas which obscured the pancreas, extrahepatic IVC, abdominal aorta and mid and distal common bile duct. 2. Diffuse hepatic steatosis and/or hepatocellular disease without focal hepatic parenchymal abnormality. 3. Approximate 3.5 cm cyst arising from the upper pole of the left kidney. Electronically Signed   By: Hulan Saas M.D.   On: 05/06/2015 16:29   Ct Abdomen Pelvis W Contrast  05/07/2015  CLINICAL DATA:  Chest pain and history of pulmonary embolism EXAM: CT ANGIOGRAPHY CHEST CT ABDOMEN AND PELVIS WITH CONTRAST TECHNIQUE: Multidetector CT imaging of the chest was performed using the  standard protocol during bolus administration of intravenous contrast. Multiplanar CT image reconstructions and MIPs were obtained to evaluate the vascular anatomy. Multidetector CT imaging of the abdomen and pelvis was performed using the standard protocol during bolus administration of intravenous contrast. CONTRAST:  OMNIPAQUE IOHEXOL 350 MG/ML SOLN COMPARISON:  03/07/2015 FINDINGS: CTA CHEST FINDINGS The lungs are well aerated bilaterally without focal infiltrate or sizable parenchymal nodule. A small right-sided pleural effusion is seen. Minimal right basilar atelectasis is noted. The thoracic inlet is within normal limits. The thoracic aorta shows no aneurysmal dilatation or dissection. Its branches are  within normal limits. The pulmonary artery demonstrates a normal branching pattern. No acute pulmonary emboli are seen. Some mild persistent filling defect in the right lower lobe branches is seen likely related to chronic pulmonary embolism. No acute bony abnormality is seen. CT ABDOMEN and PELVIS FINDINGS The liver, gallbladder, spleen, adrenal glands and pancreas are all normal in their CT appearance. Kidneys are well visualized bilaterally and demonstrate a normal enhancement pattern. A large parapelvic cyst is noted on the left measuring 4.4 cm. No obstructive changes are seen. Aortoiliac calcifications are noted. Bladder is well distended. No pelvic mass lesion or sidewall abnormality is seen. The appendix is within normal limits. Review of the MIP images confirms the above findings. IMPRESSION: Stable likely chronic pulmonary embolism in the right lower lobe branches. No new focal emboli are noted. Mild right basilar atelectasis with small effusion Left renal cyst. No other focal abnormality is seen. Electronically Signed   By: Alcide Clever M.D.   On: 05/07/2015 15:23    Scheduled Meds:  Scheduled Meds: . insulin aspart  0-9 Units Subcutaneous Q4H  . insulin glargine  15 Units Subcutaneous BID  . rivaroxaban  20 mg Oral Q supper  . sodium chloride flush  3 mL Intravenous Q12H   Continuous Infusions: . sodium chloride 125 mL/hr at 05/07/15 1502    Time spent on care of this patient: 35 min   Andra Heslin, MD 05/07/2015, 3:51 PM    [facility] Office  [phone] Pager - Text Page per [email or web address] If 7PM-7AM, please contact night-coverage [email or web address]

## 2015-05-07 NOTE — Progress Notes (Signed)
OT Cancellation Note  Patient Details Name: Jacob Donaldson MRN: [id number] DOB: [birth year]   Cancelled Treatment:    Reason Eval/Treat Not Completed: OT screened, no needs identified, will sign off  RN reports pt moving around the room.  Alba Cory 05/07/2015, 10:46 AM

## 2015-05-07 NOTE — Progress Notes (Signed)
PT Cancellation Note  Patient Details Name: Jacob Donaldson MRN: [id number] DOB: [birth year]   Cancelled Treatment:    Reason Eval/Treat Not Completed: PT screened, no needs identified, will sign off RN reports pt up around room without difficulty and reports no PT needs at this time.    Marillyn Goren,KATHrine E 05/07/2015, 10:15 AM Zenovia Jarred, PT, DPT 05/07/2015 Pager: [phone]

## 2015-05-08 DIAGNOSIS — N179 Acute kidney failure, unspecified: Secondary | ICD-10-CM | POA: Diagnosis not present

## 2015-05-08 DIAGNOSIS — E119 Type 2 diabetes mellitus without complications: Secondary | ICD-10-CM | POA: Diagnosis not present

## 2015-05-08 DIAGNOSIS — N189 Chronic kidney disease, unspecified: Secondary | ICD-10-CM | POA: Diagnosis not present

## 2015-05-08 DIAGNOSIS — R1011 Right upper quadrant pain: Secondary | ICD-10-CM | POA: Diagnosis not present

## 2015-05-08 LAB — CBC
HCT: 39.7 % (ref 39.0–52.0)
HEMOGLOBIN: 13.1 g/dL (ref 13.0–17.0)
MCH: 29.9 pg (ref 26.0–34.0)
MCHC: 33 g/dL (ref 30.0–36.0)
MCV: 90.6 fL (ref 78.0–100.0)
PLATELETS: 190 10*3/uL (ref 150–400)
RBC: 4.38 MIL/uL (ref 4.22–5.81)
RDW: 14 % (ref 11.5–15.5)
WBC: 5.7 10*3/uL (ref 4.0–10.5)

## 2015-05-08 LAB — GLUCOSE, CAPILLARY
GLUCOSE-CAPILLARY: 81 mg/dL (ref 65–99)
GLUCOSE-CAPILLARY: 78 mg/dL (ref 65–99)
GLUCOSE-CAPILLARY: 90 mg/dL (ref 65–99)
Glucose-Capillary: 92 mg/dL (ref 65–99)
Glucose-Capillary: 66 mg/dL (ref 65–99)

## 2015-05-08 LAB — LIPASE, BLOOD: LIPASE: 73 U/L — AB (ref 11–51)

## 2015-05-08 LAB — BASIC METABOLIC PANEL
ANION GAP: 8 (ref 5–15)
BUN: 12 mg/dL (ref 6–20)
CALCIUM: 8.4 mg/dL — AB (ref 8.9–10.3)
CO2: 23 mmol/L (ref 22–32)
Chloride: 106 mmol/L (ref 101–111)
Creatinine, Ser: 1.21 mg/dL (ref 0.61–1.24)
GFR calc non Af Amer: >60 mL/min (ref >60)
GLUCOSE: 97 mg/dL (ref 65–99)
Potassium: 3.7 mmol/L (ref 3.5–5.1)
Sodium: 137 mmol/L (ref 135–145)

## 2015-05-08 MED ORDER — DICLOFENAC SODIUM 1 % TD GEL
2.0000 g | Freq: Four times a day (QID) | TRANSDERMAL | Status: DC
  Administered 2015-05-08 – 2015-05-09 (×7): 2 g via TOPICAL
  Filled 2015-05-08: qty 100

## 2015-05-08 NOTE — Progress Notes (Addendum)
[facility] Progress Note   Jacob Donaldson  ZOX:[id number]  DOB: Jan 25, 1961  DOA: 05/06/2015 PCP: Pcp Not In System  Brief narrative: Jacob Donaldson is a 55 y.o. male with diabetes mellitus, pulmonary emboli on Xarelto, chronic kidney disease stage II presents to the hospital for pain that started in his right upper quadrant and then radiated to his left upper quadrant and has been present for about 1 week. He noticed yesterday that his sugar went from 100-200 which is his baseline to 400 which worried him and he therefore presented to the hospital. Workup revealed an elevated lipase and mild acute renal failure. CT scan was negative for pancreatitis or any other abdominal etiology. Ultrasound revealed diffuse hepatic steatosis and normal gallbladder with negative Murphy sign.   Subjective: Still having pain in RUQ but it is less than before. No nausea or vomiting. No diarrhea and no fevers.   Assessment/Plan: Principal Problem:   Abdominal pain -This is mostly right upper quadrant pain radiating across to his left upper quadrant -He states this pain is similar to the pain he had when he was found to have PE recently-I have obtained a CT of the chest which shows a chronic PE -The only possible etiology can see at this time is acute pancreatitis, although, CT does not show an inflamed pancreas - lipase increased from 86 to 212- now back down to 80 - no etiology for pancreatitis found-he is not an alcoholic, he has no gallstones, triglyceride level is normal -  Very anxious to leave the hospital - will advance to regular diet today - have convinced him to stay over night and also to back diet down to clear liquids if pain is worse  Active Problems: Acute renal failure -pre- renal-improving with IV fluids    Pulmonary embolism (HCC) - noted to be unchanged on CT -Continue Xarelto    Diabetes mellitus without complication (HCC) -Continue Lantus and NovoLog sliding scale-sugars are  better controlled now -Suspect rise in blood sugar may also be secondary to acute pancreatitis  Antibiotics: Anti-infectives    None     Code Status:     Code Status Orders        Start     Ordered   05/06/15 2252  Full code   Continuous     05/06/15 2252    Code Status History    Date Active Date Inactive Code Status Order ID Comments User Context   11/20/2014  2:27 AM 11/20/2014  4:52 PM Full Code [id number]  Bobette Mo, MD Inpatient   10/08/2014  4:03 PM 10/11/2014  9:01 PM Full Code [id number]  Eddie North, MD Inpatient     Family Communication: Disposition Plan: possibly home tomorrow DVT prophylaxis: Xarelto Consultants:  Procedures:     Objective: Filed Weights   05/06/15 2303  Weight: 142.52 kg (314 lb 3.2 oz)    Intake/Output Summary (Last 24 hours) at 05/08/15 1346 Last data filed at 05/07/15 2300  Gross per 24 hour  Intake   2125 ml  Output      0 ml  Net   2125 ml     Vitals Filed Vitals:   05/07/15 0548 05/07/15 1700 05/07/15 2220 05/08/15 0602  BP: 137/82 142/97 133/86 127/81  Pulse: 58 63 66 59  Temp: 97.5 F (36.4 C) 97.6 F (36.4 C) 97.6 F (36.4 C) 98 F (36.7 C)  TempSrc: Oral Oral Oral Oral  Resp: 16 16 18 18   Height:  Weight:      SpO2: 100% 100% 99% 100%    Exam:  General:  Pt is alert, not in acute distress  HEENT: No icterus, No thrush, oral mucosa moist  Cardiovascular: regular rate and rhythm, S1/S2 No murmur  Respiratory: clear to auscultation bilaterally   Abdomen: Soft, +Bowel sounds, tender in right upper quadrant, less tender in epigastrium and left upper quadrant today, non distended, no guarding  MSK: No cyanosis or clubbing- no pedal edema   Data Reviewed: Basic Metabolic Panel:  Recent Labs Lab 05/06/15 1302 05/07/15 0504 05/08/15 0447  NA 134* 136 137  K 4.2 4.0 3.7  CL 102 105 106  CO2 GLUCOSE 309* 122* 97  BUN CREATININE 1.90* 1.27* 1.21  CALCIUM 9.3 8.3*  8.4*   Liver Function Tests:  Recent Labs Lab 05/06/15 1952  AST 37  ALT 20  ALKPHOS 71  BILITOT 1.0  PROT 8.0  ALBUMIN 3.9    Recent Labs Lab 05/06/15 1302 05/07/15 0501 05/08/15 0447  LIPASE 86* 212* 73*   No results for input(s): AMMONIA in the last 168 hours. CBC:  Recent Labs Lab 05/06/15 1302 05/07/15 0504 05/08/15 0447  WBC 7.3 7.7 5.7  HGB 15.1 13.4 13.1  HCT 43.7 40.9 39.7  MCV 88.3 90.3 90.6  PLT 219 204 190   Cardiac Enzymes: No results for input(s): CKTOTAL, CKMB, CKMBINDEX, TROPONINI in the last 168 hours. BNP (last 3 results)  Recent Labs  11/19/14 2310  BNP 35.3    ProBNP (last 3 results) No results for input(s): PROBNP in the last 8760 hours.  CBG:  Recent Labs Lab 05/07/15 2208 05/08/15 0100 05/08/15 0526 05/08/15 0745 05/08/15 1146  GLUCAP 81 78 92 66 90    No results found for this or any previous visit (from the past 240 hour(s)).   Studies: Ct Abdomen Pelvis Wo Contrast  05/06/2015  CLINICAL DATA:  Upper abdominal pain for 1 week. EXAM: CT ABDOMEN AND PELVIS WITHOUT CONTRAST TECHNIQUE: Multidetector CT imaging of the abdomen and pelvis was performed following the standard protocol without IV contrast. COMPARISON:  Ultrasound earlier this day.  Chest CT 03/07/2015 FINDINGS: Lower chest: Bibasilar atelectasis, right greater than left. Trace right pleural effusion, improved from prior chest CT. Liver: Decreased density consistent with steatosis. No focal lesion. Hepatobiliary: Gallbladder physiologically distended, no calcified stone. No biliary dilatation. Pancreas: No ductal dilatation or inflammation. Spleen: Normal. Adrenal glands: No nodule. Kidneys: No hydronephrosis or renal stones. No perinephric stranding. Simple cysts in the upper left kidney. Stomach/Bowel: Stomach physiologically distended. Enteric contrast in the distal esophagus. There are no dilated or thickened small bowel loops. Small volume of stool throughout the  colon without colonic wall thickening. The appendix is normal. Vascular/Lymphatic: No retroperitoneal adenopathy. Small bilateral inguinal lymph nodes. Abdominal aorta is normal in caliber. Mild atherosclerosis without aneurysm. Reproductive: Normal for age. Bladder: Physiologically distended, no wall thickening. Other: No free air, free fluid, or intra-abdominal fluid collection. Musculoskeletal: There are no acute or suspicious osseous abnormalities. Degenerative disc disease at L4-L5. IMPRESSION: 1. Enteric contrast in the distal esophagus, may be slow transit versus reflux. 2. Otherwise no acute abnormality in the abdomen/pelvis. Electronically Signed   By: Rubye Oaks M.D.   On: 05/06/2015 23:12   Ct Angio Chest Pe W/cm &/or Wo Cm  05/07/2015  CLINICAL DATA:  Chest pain and history of pulmonary embolism EXAM: CT ANGIOGRAPHY CHEST CT ABDOMEN AND PELVIS WITH CONTRAST TECHNIQUE: Multidetector  CT imaging of the chest was performed using the standard protocol during bolus administration of intravenous contrast. Multiplanar CT image reconstructions and MIPs were obtained to evaluate the vascular anatomy. Multidetector CT imaging of the abdomen and pelvis was performed using the standard protocol during bolus administration of intravenous contrast. CONTRAST:  OMNIPAQUE IOHEXOL 350 MG/ML SOLN COMPARISON:  03/07/2015 FINDINGS: CTA CHEST FINDINGS The lungs are well aerated bilaterally without focal infiltrate or sizable parenchymal nodule. A small right-sided pleural effusion is seen. Minimal right basilar atelectasis is noted. The thoracic inlet is within normal limits. The thoracic aorta shows no aneurysmal dilatation or dissection. Its branches are within normal limits. The pulmonary artery demonstrates a normal branching pattern. No acute pulmonary emboli are seen. Some mild persistent filling defect in the right lower lobe branches is seen likely related to chronic pulmonary embolism. No acute bony  abnormality is seen. CT ABDOMEN and PELVIS FINDINGS The liver, gallbladder, spleen, adrenal glands and pancreas are all normal in their CT appearance. Kidneys are well visualized bilaterally and demonstrate a normal enhancement pattern. A large parapelvic cyst is noted on the left measuring 4.4 cm. No obstructive changes are seen. Aortoiliac calcifications are noted. Bladder is well distended. No pelvic mass lesion or sidewall abnormality is seen. The appendix is within normal limits. Review of the MIP images confirms the above findings. IMPRESSION: Stable likely chronic pulmonary embolism in the right lower lobe branches. No new focal emboli are noted. Mild right basilar atelectasis with small effusion Left renal cyst. No other focal abnormality is seen. Electronically Signed   By: Alcide Clever M.D.   On: 05/07/2015 15:23   US Abdomen Complete  05/06/2015  CLINICAL DATA:  55 year old presenting with 1 week history of right upper quadrant abdominal pain and bilateral flank pain. EXAM: ABDOMEN ULTRASOUND COMPLETE COMPARISON:  None. FINDINGS: Technically difficult examination due to patient body habitus and abundant bowel gas. Gallbladder: No shadowing gallstones or echogenic sludge. No gallbladder wall thickening or pericholecystic fluid. Negative sonographic Murphy sign according to the ultrasound technologist. Common bile duct: Diameter: Approximately 5 mm centrally. Mid and distal duct obscured by duodenal bowel gas. Liver: Diffusely increased and coarsened echotexture without focal hepatic parenchymal abnormality. Patent portal vein with hepatopetal flow. IVC: Patent in its intrahepatic portion. Obscured outside the liver by bowel gas. Pancreas: Not visualized due to midline bowel gas. Spleen: Normal size and echotexture without focal parenchymal abnormality. Right Kidney: Length: Approximately 11.2 cm. No hydronephrosis. Well-preserved cortex. No shadowing calculi. Normal parenchymal echotexture. No focal  parenchymal abnormality. Left Kidney: Length: Approximately 10.9 cm. No hydronephrosis. Well-preserved cortex. Circumscribed nearly anechoic mass with acoustic enhancement and no internal color Doppler flow involving the upper pole measuring approximately 3.5 cm. No significant focal parenchymal abnormality. No shadowing calculi. Abdominal aorta: Obscured by midline bowel gas. Other findings: None. IMPRESSION: 1. Technically less than optimal examination due to abundant bowel gas which obscured the pancreas, extrahepatic IVC, abdominal aorta and mid and distal common bile duct. 2. Diffuse hepatic steatosis and/or hepatocellular disease without focal hepatic parenchymal abnormality. 3. Approximate 3.5 cm cyst arising from the upper pole of the left kidney. Electronically Signed   By: Hulan Saas M.D.   On: 05/06/2015 16:29   Ct Abdomen Pelvis W Contrast  05/07/2015  CLINICAL DATA:  Chest pain and history of pulmonary embolism EXAM: CT ANGIOGRAPHY CHEST CT ABDOMEN AND PELVIS WITH CONTRAST TECHNIQUE: Multidetector CT imaging of the chest was performed using the standard protocol during bolus administration of intravenous  contrast. Multiplanar CT image reconstructions and MIPs were obtained to evaluate the vascular anatomy. Multidetector CT imaging of the abdomen and pelvis was performed using the standard protocol during bolus administration of intravenous contrast. CONTRAST:  OMNIPAQUE IOHEXOL 350 MG/ML SOLN COMPARISON:  03/07/2015 FINDINGS: CTA CHEST FINDINGS The lungs are well aerated bilaterally without focal infiltrate or sizable parenchymal nodule. A small right-sided pleural effusion is seen. Minimal right basilar atelectasis is noted. The thoracic inlet is within normal limits. The thoracic aorta shows no aneurysmal dilatation or dissection. Its branches are within normal limits. The pulmonary artery demonstrates a normal branching pattern. No acute pulmonary emboli are seen. Some mild persistent  filling defect in the right lower lobe branches is seen likely related to chronic pulmonary embolism. No acute bony abnormality is seen. CT ABDOMEN and PELVIS FINDINGS The liver, gallbladder, spleen, adrenal glands and pancreas are all normal in their CT appearance. Kidneys are well visualized bilaterally and demonstrate a normal enhancement pattern. A large parapelvic cyst is noted on the left measuring 4.4 cm. No obstructive changes are seen. Aortoiliac calcifications are noted. Bladder is well distended. No pelvic mass lesion or sidewall abnormality is seen. The appendix is within normal limits. Review of the MIP images confirms the above findings. IMPRESSION: Stable likely chronic pulmonary embolism in the right lower lobe branches. No new focal emboli are noted. Mild right basilar atelectasis with small effusion Left renal cyst. No other focal abnormality is seen. Electronically Signed   By: Alcide Clever M.D.   On: 05/07/2015 15:23    Scheduled Meds:  Scheduled Meds: . diclofenac sodium  2 g Topical QID  . insulin aspart  0-9 Units Subcutaneous Q4H  . insulin glargine  15 Units Subcutaneous BID  . rivaroxaban  20 mg Oral Q supper  . sodium chloride flush  3 mL Intravenous Q12H   Continuous Infusions: . sodium chloride 125 mL/hr at 05/07/15 2230    Time spent on care of this patient: 35 min   Zalia Hautala, MD 05/08/2015, 1:46 PM    [facility] Office  [phone] Pager - Text Page per [email or web address] If 7PM-7AM, please contact night-coverage [email or web address]

## 2015-05-08 NOTE — Progress Notes (Addendum)
Inpatient Diabetes Program Recommendations  AACE/ADA: New Consensus Statement on Inpatient Glycemic Control (2015)  Target Ranges:  Prepandial:   less than 140 mg/dL      Peak postprandial:   less than 180 mg/dL (1-2 hours)      Critically ill patients:  140 - 180 mg/dL    Results for NEYLAND, PETTENGILL (MRN [id number]) as of 05/08/2015 09:43  Ref. Range 05/07/2015 07:59 05/07/2015 12:26 05/07/2015 16:39 05/07/2015 22:08 05/08/2015 01:00 05/08/2015 05:26 05/08/2015 07:45  Glucose-Capillary Latest Ref Range: 65-99 mg/dL 742 (H) 595 (H) 86 81 78 92 66    Admit with: Abdominal Pain  History: DM, PE, CKD  Home DM Meds: Lantus 20 units bid       Novolog 2-10 units tid per SSI  Current Insulin Orders: Lantus 15 units bid      Novolog Sensitive Correction Scale/ SSI (0-9 units) Q4 hours     MD- Patient with mild Hypoglycemia this AM (CBG 66 mg/dl at 8am).  Please consider reducing Lantus to 10 units bid (50% total home dose) for now.  Please also consider checking current A1c.  Last A1c on file was 13.6% from 10/08/14.     --Will follow patient during hospitalization--  Ambrose Finland RN, MSN, CDE Diabetes Coordinator Inpatient Glycemic Control Team Team Pager: [phone] (8a-5p)

## 2015-05-09 DIAGNOSIS — N179 Acute kidney failure, unspecified: Secondary | ICD-10-CM | POA: Diagnosis not present

## 2015-05-09 DIAGNOSIS — E119 Type 2 diabetes mellitus without complications: Secondary | ICD-10-CM | POA: Diagnosis not present

## 2015-05-09 DIAGNOSIS — R1011 Right upper quadrant pain: Secondary | ICD-10-CM | POA: Diagnosis not present

## 2015-05-09 DIAGNOSIS — K8500 Idiopathic acute pancreatitis without necrosis or infection: Secondary | ICD-10-CM | POA: Diagnosis not present

## 2015-05-09 LAB — LIPASE, BLOOD: Lipase: 80 U/L — ABNORMAL HIGH (ref 11–51)

## 2015-05-09 LAB — GLUCOSE, CAPILLARY
GLUCOSE-CAPILLARY: 169 mg/dL — AB (ref 65–99)
GLUCOSE-CAPILLARY: 146 mg/dL — AB (ref 65–99)
GLUCOSE-CAPILLARY: 151 mg/dL — AB (ref 65–99)
GLUCOSE-CAPILLARY: 86 mg/dL (ref 65–99)
GLUCOSE-CAPILLARY: 125 mg/dL — AB (ref 65–99)
GLUCOSE-CAPILLARY: 106 mg/dL — AB (ref 65–99)
Glucose-Capillary: 95 mg/dL (ref 65–99)
Glucose-Capillary: 127 mg/dL — ABNORMAL HIGH (ref 65–99)

## 2015-05-09 LAB — BASIC METABOLIC PANEL
Anion gap: 6 (ref 5–15)
BUN: 8 mg/dL (ref 6–20)
CALCIUM: 8.8 mg/dL — AB (ref 8.9–10.3)
CO2: 26 mmol/L (ref 22–32)
CREATININE: 1.31 mg/dL — AB (ref 0.61–1.24)
Chloride: 107 mmol/L (ref 101–111)
GFR calc Af Amer: >60 mL/min (ref >60)
GLUCOSE: 145 mg/dL — AB (ref 65–99)
Potassium: 4.1 mmol/L (ref 3.5–5.1)
Sodium: 139 mmol/L (ref 135–145)

## 2015-05-09 MED ORDER — BISACODYL 5 MG PO TBEC
10.0000 mg | DELAYED_RELEASE_TABLET | Freq: Once | ORAL | Status: AC
  Administered 2015-05-09: 10 mg via ORAL
  Filled 2015-05-09: qty 2

## 2015-05-10 DIAGNOSIS — R1011 Right upper quadrant pain: Secondary | ICD-10-CM | POA: Diagnosis not present

## 2015-05-10 DIAGNOSIS — I2699 Other pulmonary embolism without acute cor pulmonale: Secondary | ICD-10-CM

## 2015-05-10 DIAGNOSIS — E119 Type 2 diabetes mellitus without complications: Secondary | ICD-10-CM | POA: Diagnosis not present

## 2015-05-10 LAB — GLUCOSE, CAPILLARY
GLUCOSE-CAPILLARY: 106 mg/dL — AB (ref 65–99)
GLUCOSE-CAPILLARY: 107 mg/dL — AB (ref 65–99)
GLUCOSE-CAPILLARY: 92 mg/dL (ref 65–99)
Glucose-Capillary: 75 mg/dL (ref 65–99)

## 2015-05-10 LAB — BASIC METABOLIC PANEL
ANION GAP: 6 (ref 5–15)
BUN: 9 mg/dL (ref 6–20)
CALCIUM: 8.3 mg/dL — AB (ref 8.9–10.3)
CO2: 23 mmol/L (ref 22–32)
CREATININE: 1.28 mg/dL — AB (ref 0.61–1.24)
Chloride: 110 mmol/L (ref 101–111)
GLUCOSE: 106 mg/dL — AB (ref 65–99)
Potassium: 3.7 mmol/L (ref 3.5–5.1)
Sodium: 139 mmol/L (ref 135–145)

## 2015-05-10 LAB — LIPASE, BLOOD: LIPASE: 94 U/L — AB (ref 11–51)

## 2015-05-10 MED ORDER — POLYETHYLENE GLYCOL 3350 17 G PO PACK: 17.0000 g | PACK | Freq: Every day | ORAL | Status: DC

## 2015-05-10 MED ORDER — ZOLPIDEM TARTRATE 10 MG PO TABS
10.0000 mg | ORAL_TABLET | Freq: Every evening | ORAL | Status: DC | PRN
  Administered 2015-05-10: 10 mg via ORAL
  Filled 2015-05-10: qty 1

## 2015-05-10 MED ORDER — INSULIN GLARGINE 100 UNIT/ML SOLOSTAR PEN: 20.0000 [IU] | PEN_INJECTOR | Freq: Two times a day (BID) | SUBCUTANEOUS | Status: DC

## 2015-05-10 NOTE — Discharge Instructions (Signed)
Follow with Primary MD in 7 days   Get CBC, CMP, Lipase checked  by Primary MD next visit.    Activity: As tolerated with Full fall precautions use walker/cane & assistance as needed   Disposition Home    Diet: Heart Healthy , carbohydrate modified , with feeding assistance and aspiration precautions.  For Heart failure patients - Check your Weight same time everyday, if you gain over 2 pounds, or you develop in leg swelling, experience more shortness of breath or chest pain, call your Primary MD immediately. Follow Cardiac Low Salt Diet and 1.5 lit/day fluid restriction.   On your next visit with your primary care physician please Get Medicines reviewed and adjusted.   Please request your Prim.MD to go over all Hospital Tests and Procedure/Radiological results at the follow up, please get all Hospital records sent to your Prim MD by signing hospital release before you go home.   If you experience worsening of your admission symptoms, develop shortness of breath, life threatening emergency, suicidal or homicidal thoughts you must seek medical attention immediately by calling 911 or calling your MD immediately  if symptoms less severe.  You Must read complete instructions/literature along with all the possible adverse reactions/side effects for all the Medicines you take and that have been prescribed to you. Take any new Medicines after you have completely understood and accpet all the possible adverse reactions/side effects.   Do not drive, operating heavy machinery, perform activities at heights, swimming or participation in water activities or provide baby sitting services if your were admitted for syncope or siezures until you have seen by Primary MD or a Neurologist and advised to do so again.  Do not drive when taking Pain medications.    Do not take more than prescribed Pain, Sleep and Anxiety Medications  Special Instructions: If you have smoked or chewed Tobacco  in the last  2 yrs please stop smoking, stop any regular Alcohol  and or any Recreational drug use.  Wear Seat belts while driving.   Please note  You were cared for by a hospitalist during your hospital stay. If you have any questions about your discharge medications or the care you received while you were in the hospital after you are discharged, you can call the unit and asked to speak with the hospitalist on call if the hospitalist that took care of you is not available. Once you are discharged, your primary care physician will handle any further medical issues. Please note that NO REFILLS for any discharge medications will be authorized once you are discharged, as it is imperative that you return to your primary care physician (or establish a relationship with a primary care physician if you do not have one) for your aftercare needs so that they can reassess your need for medications and monitor your lab values.

## 2015-05-10 NOTE — Progress Notes (Signed)
Patient given discharge, follow up , and medication instructions, verbalized understanding, prescription given, IV and telemetry box removed, pt to transport self home.

## 2015-05-10 NOTE — Discharge Summary (Signed)
Jacob Donaldson, is a 55 y.o. male  DOB 1960/12/22  MRN [id number].  Admission date:  05/06/2015  Admitting Physician  Lorretta Harp, MD  Discharge Date:  05/10/2015   Primary MD  Pcp Not In System  Recommendations for primary care physician for things to follow:  - Please check CBC, CMP, lipase little during next visit   Admission Diagnosis  Abdominal pain [R10.9]   Discharge Diagnosis  Abdominal pain [R10.9]    Principal Problem:   Abdominal pain Active Problems:   Obesity   Pulmonary embolism (HCC)   Diabetes mellitus without complication (HCC)   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   Idiopathic acute pancreatitis      Past Medical History  Diagnosis Date  . Diabetes mellitus without complication (HCC)   . Pulmonary embolism (HCC)   . CKD (chronic kidney disease), stage II     History reviewed. No pertinent past surgical history.     History of present illness and  Hospital Course:     Kindly see H&P for history of present illness and admission details, please review complete Labs, Consult reports and Test reports for all details in brief  HPI  from the history and physical done on the day of admission  HPI: Jacob Donaldson is a 55 y.o. male with PMH of diabetes mellitus, pulmonary embolism on Xarelto, chronic kidney disease-stage II, who presents with bilateral side pain (lower rib cage and upper abdomen).  Patient reports that he has been having bilateral side pain (lower rib cage and upper abdomen) for about one week. It is constant, moderate, nonradiating. He reports he has baseline right lower chest pain because of PE,, but now he has left-sided pain also. He has some mild shortness of breath, but no fever, chills, cough. Patient does not have nausea, vomiting, diarrhea. No symptoms of UTI or unilateral weakness. He does not drink alcohol.  In ED, patient was found to have elevated lipase  86, troponin negative, d-dimer negative, triglyceride 185, negative urinalysis, worsening renal function, negative chest x-ray. Abdominal ultrasound was technically less than optimal examination, but showed diffuse hepatic steatosis and/or hepatocellular disease without focal hepatic parenchymal abnormality, approximate 3.5 cm cyst arising from the upper pole of the left kidney. CT-abd/pelvis did not show acute abnormalities. Patient is admitted to inpatient for further eval and treatment.    Hospital Course   Jacob Donaldson is a 55 y.o. male with diabetes mellitus, pulmonary emboli on Xarelto, chronic kidney disease stage II presents to the hospital for pain that started in his right upper quadrant and then radiated to his left upper quadrant and has been present for about 1 week. He noticed yesterday that his sugar went from 100-200 which is his baseline to 400 which worried him and he therefore presented to the hospital. Workup revealed an elevated lipase and mild acute renal failure. CT scan was negative for pancreatitis or any other abdominal etiology. Ultrasound revealed diffuse hepatic steatosis and normal gallbladder with negative Murphy sign.  Abdominal pain -This is mostly  right upper quadrant pain radiating across to his left upper quadrant, He states this pain is similar to the pain he had when he was found to have PE recently-I have obtained a CT of the chest which shows a chronic PE -Unlikely this is related to acute pancreatitis as  no etiology for pancreatitis found-he is not an alcoholic, he has no gallstones, triglyceride level is normal, lipase peaked at 212, is 94 at day of discharge, patient was on regular diet, which did very well over last 24 hours.   Acute renal failure -pre- renal-improved with IV fluids   Pulmonary embolism (HCC) - noted to be unchanged on CT -Continue Xarelto   Diabetes mellitus without complication (HCC) -Continue with home medication   Discharge  Condition:  stable   Follow UP   with PCP, can't recall his name right now, he will get information from his wife   Discharge Instructions  and  Discharge Medications         Discharge Instructions    Discharge instructions    Complete by:  As directed   Follow with Primary MD in 7 days   Get CBC, CMP, Lipase checked  by Primary MD next visit.    Activity: As tolerated with Full fall precautions use walker/cane & assistance as needed   Disposition Home    Diet: Heart Healthy , carbohydrate modified , with feeding assistance and aspiration precautions.  For Heart failure patients - Check your Weight same time everyday, if you gain over 2 pounds, or you develop in leg swelling, experience more shortness of breath or chest pain, call your Primary MD immediately. Follow Cardiac Low Salt Diet and 1.5 lit/day fluid restriction.   On your next visit with your primary care physician please Get Medicines reviewed and adjusted.   Please request your Prim.MD to go over all Hospital Tests and Procedure/Radiological results at the follow up, please get all Hospital records sent to your Prim MD by signing hospital release before you go home.   If you experience worsening of your admission symptoms, develop shortness of breath, life threatening emergency, suicidal or homicidal thoughts you must seek medical attention immediately by calling 911 or calling your MD immediately  if symptoms less severe.  You Must read complete instructions/literature along with all the possible adverse reactions/side effects for all the Medicines you take and that have been prescribed to you. Take any new Medicines after you have completely understood and accpet all the possible adverse reactions/side effects.   Do not drive, operating heavy machinery, perform activities at heights, swimming or participation in water activities or provide baby sitting services if your were admitted for syncope or siezures  until you have seen by Primary MD or a Neurologist and advised to do so again.  Do not drive when taking Pain medications.    Do not take more than prescribed Pain, Sleep and Anxiety Medications  Special Instructions: If you have smoked or chewed Tobacco  in the last 2 yrs please stop smoking, stop any regular Alcohol  and or any Recreational drug use.  Wear Seat belts while driving.   Please note  You were cared for by a hospitalist during your hospital stay. If you have any questions about your discharge medications or the care you received while you were in the hospital after you are discharged, you can call the unit and asked to speak with the hospitalist on call if the hospitalist that took care of you is not available.  Once you are discharged, your primary care physician will handle any further medical issues. Please note that NO REFILLS for any discharge medications will be authorized once you are discharged, as it is imperative that you return to your primary care physician (or establish a relationship with a primary care physician if you do not have one) for your aftercare needs so that they can reassess your need for medications and monitor your lab values.     Increase activity slowly    Complete by:  As directed             Medication List    STOP taking these medications        azithromycin 250 MG tablet  Commonly known as:  ZITHROMAX      TAKE these medications        insulin aspart 100 UNIT/ML FlexPen  Commonly known as:  NOVOLOG FLEXPEN  Before each meal 3 times a day, 140-199 - 2 units, 200-250 - 4 units, 251-299 - 6 units,  300-349 - 8 units,  350 or above 10 units. Insulin PEN if approved, provide syringes and needles if needed.     Insulin Glargine 100 UNIT/ML Solostar Pen  Commonly known as:  LANTUS SOLOSTAR  Inject 20 Units into the skin 2 (two) times daily.     polyethylene glycol packet  Commonly known as:  MIRALAX  Take 17 g by mouth daily.      XARELTO 20 MG Tabs tablet  Generic drug:  rivaroxaban  Take 20 mg by mouth daily with supper.          Diet and Activity recommendation: See Discharge Instructions above   Consults obtained -  none   Major procedures and Radiology Reports - PLEASE review detailed and final reports for all details, in brief -      Ct Abdomen Pelvis Wo Contrast  05/06/2015  CLINICAL DATA:  Upper abdominal pain for 1 week. EXAM: CT ABDOMEN AND PELVIS WITHOUT CONTRAST TECHNIQUE: Multidetector CT imaging of the abdomen and pelvis was performed following the standard protocol without IV contrast. COMPARISON:  Ultrasound earlier this day.  Chest CT 03/07/2015 FINDINGS: Lower chest: Bibasilar atelectasis, right greater than left. Trace right pleural effusion, improved from prior chest CT. Liver: Decreased density consistent with steatosis. No focal lesion. Hepatobiliary: Gallbladder physiologically distended, no calcified stone. No biliary dilatation. Pancreas: No ductal dilatation or inflammation. Spleen: Normal. Adrenal glands: No nodule. Kidneys: No hydronephrosis or renal stones. No perinephric stranding. Simple cysts in the upper left kidney. Stomach/Bowel: Stomach physiologically distended. Enteric contrast in the distal esophagus. There are no dilated or thickened small bowel loops. Small volume of stool throughout the colon without colonic wall thickening. The appendix is normal. Vascular/Lymphatic: No retroperitoneal adenopathy. Small bilateral inguinal lymph nodes. Abdominal aorta is normal in caliber. Mild atherosclerosis without aneurysm. Reproductive: Normal for age. Bladder: Physiologically distended, no wall thickening. Other: No free air, free fluid, or intra-abdominal fluid collection. Musculoskeletal: There are no acute or suspicious osseous abnormalities. Degenerative disc disease at L4-L5. IMPRESSION: 1. Enteric contrast in the distal esophagus, may be slow transit versus reflux. 2. Otherwise no  acute abnormality in the abdomen/pelvis. Electronically Signed   By: Rubye Oaks M.D.   On: 05/06/2015 23:12   Dg Chest 2 View  05/06/2015  CLINICAL DATA:  Lower chest and upper abdominal pain for the past week. Shortness of breath. EXAM: CHEST  2 VIEW COMPARISON:  Chest CT 03/07/2015 FINDINGS: Heart and mediastinal contours are within  normal limits. No focal opacities or effusions. No acute bony abnormality. IMPRESSION: No active cardiopulmonary disease. Electronically Signed   By: Charlett Nose M.D.   On: 05/06/2015 13:37   Ct Angio Chest Pe W/cm &/or Wo Cm  05/07/2015  CLINICAL DATA:  Chest pain and history of pulmonary embolism EXAM: CT ANGIOGRAPHY CHEST CT ABDOMEN AND PELVIS WITH CONTRAST TECHNIQUE: Multidetector CT imaging of the chest was performed using the standard protocol during bolus administration of intravenous contrast. Multiplanar CT image reconstructions and MIPs were obtained to evaluate the vascular anatomy. Multidetector CT imaging of the abdomen and pelvis was performed using the standard protocol during bolus administration of intravenous contrast. CONTRAST:  OMNIPAQUE IOHEXOL 350 MG/ML SOLN COMPARISON:  03/07/2015 FINDINGS: CTA CHEST FINDINGS The lungs are well aerated bilaterally without focal infiltrate or sizable parenchymal nodule. A small right-sided pleural effusion is seen. Minimal right basilar atelectasis is noted. The thoracic inlet is within normal limits. The thoracic aorta shows no aneurysmal dilatation or dissection. Its branches are within normal limits. The pulmonary artery demonstrates a normal branching pattern. No acute pulmonary emboli are seen. Some mild persistent filling defect in the right lower lobe branches is seen likely related to chronic pulmonary embolism. No acute bony abnormality is seen. CT ABDOMEN and PELVIS FINDINGS The liver, gallbladder, spleen, adrenal glands and pancreas are all normal in their CT appearance. Kidneys are well visualized  bilaterally and demonstrate a normal enhancement pattern. A large parapelvic cyst is noted on the left measuring 4.4 cm. No obstructive changes are seen. Aortoiliac calcifications are noted. Bladder is well distended. No pelvic mass lesion or sidewall abnormality is seen. The appendix is within normal limits. Review of the MIP images confirms the above findings. IMPRESSION: Stable likely chronic pulmonary embolism in the right lower lobe branches. No new focal emboli are noted. Mild right basilar atelectasis with small effusion Left renal cyst. No other focal abnormality is seen. Electronically Signed   By: Alcide Clever M.D.   On: 05/07/2015 15:23   US Abdomen Complete  05/06/2015  CLINICAL DATA:  55 year old presenting with 1 week history of right upper quadrant abdominal pain and bilateral flank pain. EXAM: ABDOMEN ULTRASOUND COMPLETE COMPARISON:  None. FINDINGS: Technically difficult examination due to patient body habitus and abundant bowel gas. Gallbladder: No shadowing gallstones or echogenic sludge. No gallbladder wall thickening or pericholecystic fluid. Negative sonographic Murphy sign according to the ultrasound technologist. Common bile duct: Diameter: Approximately 5 mm centrally. Mid and distal duct obscured by duodenal bowel gas. Liver: Diffusely increased and coarsened echotexture without focal hepatic parenchymal abnormality. Patent portal vein with hepatopetal flow. IVC: Patent in its intrahepatic portion. Obscured outside the liver by bowel gas. Pancreas: Not visualized due to midline bowel gas. Spleen: Normal size and echotexture without focal parenchymal abnormality. Right Kidney: Length: Approximately 11.2 cm. No hydronephrosis. Well-preserved cortex. No shadowing calculi. Normal parenchymal echotexture. No focal parenchymal abnormality. Left Kidney: Length: Approximately 10.9 cm. No hydronephrosis. Well-preserved cortex. Circumscribed nearly anechoic mass with acoustic enhancement and no  internal color Doppler flow involving the upper pole measuring approximately 3.5 cm. No significant focal parenchymal abnormality. No shadowing calculi. Abdominal aorta: Obscured by midline bowel gas. Other findings: None. IMPRESSION: 1. Technically less than optimal examination due to abundant bowel gas which obscured the pancreas, extrahepatic IVC, abdominal aorta and mid and distal common bile duct. 2. Diffuse hepatic steatosis and/or hepatocellular disease without focal hepatic parenchymal abnormality. 3. Approximate 3.5 cm cyst arising from the upper pole of  the left kidney. Electronically Signed   By: Hulan Saas M.D.   On: 05/06/2015 16:29   Ct Abdomen Pelvis W Contrast  05/07/2015  CLINICAL DATA:  Chest pain and history of pulmonary embolism EXAM: CT ANGIOGRAPHY CHEST CT ABDOMEN AND PELVIS WITH CONTRAST TECHNIQUE: Multidetector CT imaging of the chest was performed using the standard protocol during bolus administration of intravenous contrast. Multiplanar CT image reconstructions and MIPs were obtained to evaluate the vascular anatomy. Multidetector CT imaging of the abdomen and pelvis was performed using the standard protocol during bolus administration of intravenous contrast. CONTRAST:  OMNIPAQUE IOHEXOL 350 MG/ML SOLN COMPARISON:  03/07/2015 FINDINGS: CTA CHEST FINDINGS The lungs are well aerated bilaterally without focal infiltrate or sizable parenchymal nodule. A small right-sided pleural effusion is seen. Minimal right basilar atelectasis is noted. The thoracic inlet is within normal limits. The thoracic aorta shows no aneurysmal dilatation or dissection. Its branches are within normal limits. The pulmonary artery demonstrates a normal branching pattern. No acute pulmonary emboli are seen. Some mild persistent filling defect in the right lower lobe branches is seen likely related to chronic pulmonary embolism. No acute bony abnormality is seen. CT ABDOMEN and PELVIS FINDINGS The liver,  gallbladder, spleen, adrenal glands and pancreas are all normal in their CT appearance. Kidneys are well visualized bilaterally and demonstrate a normal enhancement pattern. A large parapelvic cyst is noted on the left measuring 4.4 cm. No obstructive changes are seen. Aortoiliac calcifications are noted. Bladder is well distended. No pelvic mass lesion or sidewall abnormality is seen. The appendix is within normal limits. Review of the MIP images confirms the above findings. IMPRESSION: Stable likely chronic pulmonary embolism in the right lower lobe branches. No new focal emboli are noted. Mild right basilar atelectasis with small effusion Left renal cyst. No other focal abnormality is seen. Electronically Signed   By: Alcide Clever M.D.   On: 05/07/2015 15:23    Micro Results     No results found for this or any previous visit (from the past 240 hour(s)).     Today   Subjective:   Jacob Donaldson today has no headache,no chest or abdominal pain, feels much better and eager to go home today.tolerate diet over last 24 hours  Objective:   Blood pressure 125/87, pulse 78, temperature 97.7 F (36.5 C), temperature source Oral, resp. rate 18, height 6\' 7"  (2.007 m), weight 142.52 kg (314 lb 3.2 oz), SpO2 100 %.   Intake/Output Summary (Last 24 hours) at 05/10/15 1255 Last data filed at 05/09/15 2300  Gross per 24 hour  Intake    500 ml  Output      0 ml  Net    500 ml    Exam Awake Alert, Oriented x 3, No new F.N deficits, Normal affect [place].AT,PERRAL Supple Neck,No JVD, No cervical lymphadenopathy appriciated.  Symmetrical Chest wall movement, Good air movement bilaterally, CTAB RRR,No Gallops,Rubs or new Murmurs, No Parasternal Heave +ve B.Sounds, Abd Soft, Non tender, No organomegaly appriciated, No rebound -guarding or rigidity. No Cyanosis, Clubbing or edema, No new Rash or bruise  Data Review   CBC w Diff:  Lab Results  Component Value Date   WBC 5.7 05/08/2015   HGB 13.1  05/08/2015   HCT 39.7 05/08/2015   PLT 190 05/08/2015   LYMPHOPCT 25 03/07/2015   MONOPCT 7 03/07/2015   EOSPCT 8 03/07/2015   BASOPCT 0 03/07/2015    CMP:  Lab Results  Component Value Date  NA 139 05/10/2015   K 3.7 05/10/2015   CL 110 05/10/2015   CO2 23 05/10/2015   BUN 9 05/10/2015   CREATININE 1.28* 05/10/2015   PROT 8.0 05/06/2015   ALBUMIN 3.9 05/06/2015   BILITOT 1.0 05/06/2015   ALKPHOS 71 05/06/2015   AST 37 05/06/2015   ALT 20 05/06/2015  .   Total Time in preparing paper work, data evaluation and todays exam - 35 minutes  Shae Augello M.D on 05/10/2015 at 12:55 PM  [facility]   Office  [phone]

## 2016-02-10 ENCOUNTER — Encounter (HOSPITAL_BASED_OUTPATIENT_CLINIC_OR_DEPARTMENT_OTHER): Payer: Self-pay | Admitting: *Deleted

## 2016-02-10 ENCOUNTER — Emergency Department (HOSPITAL_BASED_OUTPATIENT_CLINIC_OR_DEPARTMENT_OTHER): Payer: 59

## 2016-02-10 ENCOUNTER — Emergency Department (HOSPITAL_BASED_OUTPATIENT_CLINIC_OR_DEPARTMENT_OTHER)
Admission: EM | Admit: 2016-02-10 | Discharge: 2016-02-10 | Disposition: A | Discharge status: Home / Self Care | Payer: 59 | Attending: Emergency Medicine | Admitting: Emergency Medicine

## 2016-02-10 DIAGNOSIS — Z7901 Long term (current) use of anticoagulants: Secondary | ICD-10-CM | POA: Diagnosis not present

## 2016-02-10 DIAGNOSIS — I712 Thoracic aortic aneurysm, without rupture, unspecified: Secondary | ICD-10-CM

## 2016-02-10 DIAGNOSIS — N182 Chronic kidney disease, stage 2 (mild): Secondary | ICD-10-CM | POA: Diagnosis not present

## 2016-02-10 DIAGNOSIS — R079 Chest pain, unspecified: Secondary | ICD-10-CM

## 2016-02-10 DIAGNOSIS — J90 Pleural effusion, not elsewhere classified: Secondary | ICD-10-CM | POA: Diagnosis not present

## 2016-02-10 DIAGNOSIS — E1122 Type 2 diabetes mellitus with diabetic chronic kidney disease: Secondary | ICD-10-CM | POA: Diagnosis not present

## 2016-02-10 DIAGNOSIS — R0602 Shortness of breath: Secondary | ICD-10-CM | POA: Diagnosis present

## 2016-02-10 DIAGNOSIS — Z794 Long term (current) use of insulin: Secondary | ICD-10-CM | POA: Diagnosis not present

## 2016-02-10 LAB — CBC WITH DIFFERENTIAL/PLATELET
BASOS ABS: 0 10*3/uL (ref 0.0–0.1)
BASOS PCT: 0 %
Eosinophils Absolute: 0.4 10*3/uL (ref 0.0–0.7)
Eosinophils Relative: 4 %
HEMATOCRIT: 41.6 % (ref 39.0–52.0)
HEMOGLOBIN: 14 g/dL (ref 13.0–17.0)
Lymphocytes Relative: 22 %
Lymphs Abs: 2.2 10*3/uL (ref 0.7–4.0)
MCH: 30.5 pg (ref 26.0–34.0)
MCHC: 33.7 g/dL (ref 30.0–36.0)
MCV: 90.6 fL (ref 78.0–100.0)
MONOS PCT: 8 %
Monocytes Absolute: 0.8 10*3/uL (ref 0.1–1.0)
NEUTROS ABS: 6.3 10*3/uL (ref 1.7–7.7)
NEUTROS PCT: 66 %
Platelets: 220 10*3/uL (ref 150–400)
RBC: 4.59 MIL/uL (ref 4.22–5.81)
RDW: 13.4 % (ref 11.5–15.5)
WBC: 9.7 10*3/uL (ref 4.0–10.5)

## 2016-02-10 LAB — BASIC METABOLIC PANEL
ANION GAP: 8 (ref 5–15)
BUN: 15 mg/dL (ref 6–20)
CHLORIDE: 105 mmol/L (ref 101–111)
CO2: 23 mmol/L (ref 22–32)
Calcium: 8.8 mg/dL — ABNORMAL LOW (ref 8.9–10.3)
Creatinine, Ser: 1.46 mg/dL — ABNORMAL HIGH (ref 0.61–1.24)
GFR calc non Af Amer: 54 mL/min — ABNORMAL LOW (ref >60)
Glucose, Bld: 161 mg/dL — ABNORMAL HIGH (ref 65–99)
POTASSIUM: 3.8 mmol/L (ref 3.5–5.1)
Sodium: 136 mmol/L (ref 135–145)

## 2016-02-10 LAB — TROPONIN I: Troponin I: <0.03 ng/mL (ref <0.03)

## 2016-02-10 LAB — D-DIMER, QUANTITATIVE (NOT AT ARMC): D DIMER QUANT: 1.4 ug{FEU}/mL — AB (ref 0.00–0.50)

## 2016-02-10 MED ORDER — IOPAMIDOL (ISOVUE-370) INJECTION 76%
100.0000 mL | Freq: Once | INTRAVENOUS | Status: AC | PRN
  Administered 2016-02-10: 100 mL via INTRAVENOUS

## 2016-02-10 MED ORDER — SODIUM CHLORIDE 0.9 % IV BOLUS (SEPSIS)
1000.0000 mL | Freq: Once | INTRAVENOUS | Status: AC
  Administered 2016-02-10: 1000 mL via INTRAVENOUS

## 2016-02-10 MED ORDER — OXYCODONE-ACETAMINOPHEN 5-325 MG PO TABS: 1.0000 | ORAL_TABLET | ORAL | 0 refills | Status: DC | PRN

## 2016-02-10 NOTE — ED Notes (Signed)
Patient denies pain and is resting comfortably.  

## 2016-02-10 NOTE — ED Notes (Signed)
ED Provider at bedside. 

## 2016-02-10 NOTE — ED Notes (Signed)
Patient transported to CT 

## 2016-02-10 NOTE — ED Triage Notes (Signed)
Pt c/o SOB x 1 week right side chest pain x 1 week. HX PE

## 2016-02-10 NOTE — ED Notes (Signed)
Patient transported to X-ray 

## 2016-02-10 NOTE — ED Provider Notes (Signed)
[facility] Provider Note   CSN: [id number] Arrival date & time: 02/10/16  1040     History   Chief Complaint Chief Complaint  Patient presents with  . Shortness of Breath    HPI Jacob Donaldson is a 55 y.o. male.  HPI Jacob Donaldson is a 55 y.o. male with PMH significant for CKD, DM, PE who presents with 1 week history of sudden onset, waxing and waning, sharp right sided chest pain with associated shortness of breath and palpitations.  No fever, chills, cough, hemoptysis, syncope, unilateral leg swelling, or abdominal pain.  Worse with deep inspiration and lying on his right side.  Nothing makes his symptoms better.  No medications PTA.  He states this feels similar to his previous PE.  He states he was previously on Xarelto, but his PCP took him off of it 2 weeks ago.  He has an appointment with a new PCP next week.   Past Medical History:  Diagnosis Date  . CKD (chronic kidney disease), stage II   . Diabetes mellitus without complication (HCC)   . Pulmonary embolism [facility])     Patient Active Problem List   Diagnosis Date Noted  . Idiopathic acute pancreatitis   . 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 05/06/2015  . Abdominal pain 05/06/2015  . Pulmonary embolism (HCC) 11/20/2014  . Diabetes mellitus without complication (HCC) 11/20/2014  . Acute renal failure syndrome (HCC)   . DKA, type 2 (HCC) 10/08/2014  . Obesity 10/08/2014    History reviewed. No pertinent surgical history.     Home Medications    Prior to Admission medications   Medication Sig Start Date End Date Taking? Authorizing Provider  rivaroxaban (XARELTO) 20 MG TABS tablet Take 20 mg by mouth daily with supper.   Yes Historical Provider, MD  insulin aspart (NOVOLOG FLEXPEN) 100 UNIT/ML FlexPen Before each meal 3 times a day, 140-199 - 2 units, 200-250 - 4 units, 251-299 - 6 units,  300-349 - 8 units,  350 or above 10 units. Insulin PEN if approved, provide syringes and needles if needed.  10/11/14   Albertine Grates, MD  Insulin Glargine (LANTUS SOLOSTAR) 100 UNIT/ML Solostar Pen Inject 20 Units into the skin 2 (two) times daily. 05/10/15   Leana Roe Elgergawy, MD  oxyCODONE-acetaminophen (PERCOCET/ROXICET) 5-325 MG tablet Take 1 tablet by mouth every 4 (four) hours as needed for severe pain. 02/10/16   Cheri Fowler, PA-C  polyethylene glycol (MIRALAX) packet Take 17 g by mouth daily. 05/10/15   Starleen Arms, MD    Family History Family History  Problem Relation Age of Onset  . Heart disease Mother   . Diabetes Mellitus II Maternal Grandmother     Social History Social History  Substance Use Topics  . Smoking status: Never Smoker  . Smokeless tobacco: Not on file  . Alcohol use No     Allergies   Patient has no known allergies.   Review of Systems Review of Systems All other systems negative unless otherwise stated in HPI   Physical Exam Updated Vital Signs BP 132/85 (BP Location: Left Arm)   Pulse 75   Temp 99.3 F (37.4 C) (Oral)   Resp 20   Ht 6\' 7"  (2.007 m)   Wt (!) 137 kg   SpO2 100%   BMI 34.02 kg/m   Physical Exam  Constitutional: He is oriented to person, place, and time. He appears well-developed and well-nourished.  Non-toxic appearance. He does not have a sickly appearance.  He does not appear ill.  HENT:  Head: Normocephalic and atraumatic.  Mouth/Throat: Oropharynx is clear and moist.  Eyes: Conjunctivae are normal. Pupils are equal, round, and reactive to light.  Neck: Normal range of motion. Neck supple.  Cardiovascular: Regular rhythm and normal heart sounds.  Tachycardia present.   Lower extremities symmetric.   Pulmonary/Chest: Effort normal and breath sounds normal. No accessory muscle usage or stridor. No respiratory distress. He has no wheezes. He has no rhonchi. He has no rales. He exhibits no tenderness.  Abdominal: Soft. Bowel sounds are normal. He exhibits no distension. There is no tenderness.  Musculoskeletal: Normal range of  motion.  Lymphadenopathy:    He has no cervical adenopathy.  Neurological: He is alert and oriented to person, place, and time.  Speech clear without dysarthria.  Skin: Skin is warm and dry.  Psychiatric: He has a normal mood and affect. His behavior is normal.     ED Treatments / Results  Labs (all labs ordered are listed, but only abnormal results are displayed) Labs Reviewed  BASIC METABOLIC PANEL - Abnormal; Notable for the following:       Result Value   Glucose, Bld 161 (*)    Creatinine, Ser 1.46 (*)    Calcium 8.8 (*)    GFR calc non Af Amer 54 (*)    All other components within normal limits  D-DIMER, QUANTITATIVE (NOT AT [facility]) - Abnormal; Notable for the following:    D-Dimer, Quant 1.40 (*)    All other components within normal limits  CBC WITH DIFFERENTIAL/PLATELET  TROPONIN I    EKG  EKG Interpretation None      ED ECG REPORT   Date: 02/10/2016  Rate: 77  Rhythm: normal sinus rhythm  QRS Axis: normal  Intervals: normal  ST/T Wave abnormalities: abnormal R wave progression, early transition  Conduction Disutrbances:none  Narrative Interpretation:   Old EKG Reviewed: none available  I have personally reviewed the EKG tracing and agree with the computerized printout as noted.   Radiology Dg Chest 2 View  Result Date: 02/10/2016 CLINICAL DATA:  55 year old male with pulsing sensation in the right lower chest and mild shortness of breath. Prior history of pulmonary embolism. EXAM: CHEST  2 VIEW COMPARISON:  Chest x-ray 05/06/2015. FINDINGS: Small right pleural effusion. Minimal subsegmental atelectasis in the right lower lobe. Left lung is clear. No pneumothorax. No evidence of pulmonary edema. Heart size is normal. Upper mediastinal contours are within normal limits. IMPRESSION: 1. Small right pleural effusion with minimal subsegmental atelectasis in the right lower lobe. Electronically Signed   By: Trudie Reedaniel  Entrikin M.D.   On: 02/10/2016 12:22   Ct Angio  Chest Pe W/cm &/or Wo Cm  Result Date: 02/10/2016 CLINICAL DATA:  55 year old with shortness of breath and right chest pain. Elevated D-dimer. History of pulmonary embolism. EXAM: CT ANGIOGRAPHY CHEST WITH CONTRAST TECHNIQUE: Multidetector CT imaging of the chest was performed using the standard protocol during bolus administration of intravenous contrast. Multiplanar CT image reconstructions and MIPs were obtained to evaluate the vascular anatomy. CONTRAST:  100 mL Isovue 370 COMPARISON:  Chest CT 05/07/2015 and 03/07/2015 FINDINGS: Cardiovascular: Again noted is mild irregularity and possibly wall thickening involving pulmonary artery branches in the right lower lobe on sequence 10, image 187. These findings are similar to the examination on 03/07/2015 and suspect this is related to an old pulmonary embolism. There is no evidence for an acute pulmonary embolism. Stable enlargement of the ascending thoracic aorta measuring  up to 4.3 cm. Limited evaluation for aortic dissection due to lack of contrast in the thoracic aorta. Mediastinum/Nodes: Esophagus is unremarkable. No gross abnormality to the thyroid tissue. No significant chest lymphadenopathy. Stable prominent lymph node in left axilla. No significant pericardial fluid. Lungs/Pleura: There is a small right pleural effusion. This effusion has enlarged compared to the previous CT examination. Trachea and mainstem bronchi are patent. Left lung is clear. Patchy densities in the right lower lobe may be related to atelectasis. Difficult to exclude mild asymmetric edema in the right lung. Upper Abdomen: Partial visualization of a large left renal cyst. No acute abnormality in the upper abdomen. Musculoskeletal: No acute abnormality. Review of the MIP images confirms the above findings. IMPRESSION: Negative for an acute pulmonary embolism. Stable irregularity involving right lower lobe pulmonary artery branches. This could represent changes from an old pulmonary  embolism. Small right pleural effusion. Patchy densities in the right lower lobe are probably related to atelectasis. 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 Recommend annual imaging followup by CTA or MRA. This recommendation follows 2010 ACCF/AHA/AATS/ACR/ASA/SCA/SCAI/SIR/STS/SVM Guidelines for the Diagnosis and Management of Patients with Thoracic Aortic Disease. Circulation. 2010; 121: Z610-R604: [id number] Electronically Signed   By: Richarda OverlieAdam  Henn M.D.   On: 02/10/2016 12:52    Procedures Procedures (including critical care time)  Medications Ordered in ED Medications  sodium chloride 0.9 % bolus 1,000 mL (0 mLs Intravenous Stopped 02/10/16 1313)  iopamidol (ISOVUE-370) 76 % injection 100 mL (100 mLs Intravenous Contrast Given 02/10/16 1153)     Initial Impression / Assessment and Plan / ED Course  I have reviewed the triage vital signs and the nursing notes.  Pertinent labs & imaging results that were available during my care of the patient were reviewed by me and considered in my medical decision making (see chart for details).  Clinical Course    Patient with hx of PE recently taken of of Xarelto presents with 1 week of right sided pleuritic chest pain similar to previous PE.  No fever, chills, cough, hemoptysis.  No signs of DVT. Given history, concern for PE.  CTA was negative for PE, but showed a small right pleural effusion and likely atelectasis.  He has not PNA symptoms, so abx not indicated at this time.  Incidental thoracic aortic aneurysm 4.3 cm.  Patient will follow up with CT surgery.  Labs without acute abnormalities.  Vitals reassuring.  Short course of percocet.  Follow up PCP this week at his appointment.  Return precautions discussed including worsening pain, shortness of breath, syncope, cough, fever, or any new or concerning symptoms.  Stable for discharge.  Case has been discussed with Dr. Verdie MosherLiu who agrees with the above plan for discharge.   Final  Clinical Impressions(s) / ED Diagnoses   Final diagnoses:  Right-sided chest pain  Pleural effusion  Thoracic aortic aneurysm without rupture (HCC)    New Prescriptions New Prescriptions   OXYCODONE-ACETAMINOPHEN (PERCOCET/ROXICET) 5-325 MG TABLET    Take 1 tablet by mouth every 4 (four) hours as needed for severe pain.     Cheri FowlerKayla Guyla Bless, PA-C 02/10/16 1343    Lavera Guiseana Duo Liu, MD 02/10/16 2217

## 2016-02-10 NOTE — Discharge Instructions (Signed)
Your workup today does not show evidence of a pulmonary embolus. Your CT did show a  small pleural effusion (fluid) and stable aneurysm of the ascending thoracic aorta measuring up to 4.3 cm. Recommend annual imaging followup by CTA or MRA.  This needs to be followed by CT surgery and your primary care physician.  Return to the ED for worsening pain, shortness of breath, difficulty walking, passing out, fever, cough, or any new or concerning symptoms.

## 2017-02-04 IMAGING — CT CT ANGIO CHEST
2 of 6 series · 18 of 36 positions shown · IV contrast (OMNIPAQUE 350)
Comparison: Chest radiograph 11/19/2014

CLINICAL DATA: Right-sided abdominal pain. Difficulty breathing.
Pleuritic right lateral chest wall pain. Shortness of breath.
Infiltrates on x-ray.

EXAM:
CT ANGIOGRAPHY CHEST WITH CONTRAST
TECHNIQUE: Multidetector CT imaging of the chest was performed using the
standard protocol during bolus administration of intravenous
contrast. Multiplanar CT image reconstructions and MIPs were
obtained to evaluate the vascular anatomy.
CONTRAST:  100mL OMNIPAQUE IOHEXOL 350 MG/ML SOLN

[Series 5: coronal mpr · coronal · 0.49mm/px · 1 of 151 slices shown]
[im 76/151  mediastinal]
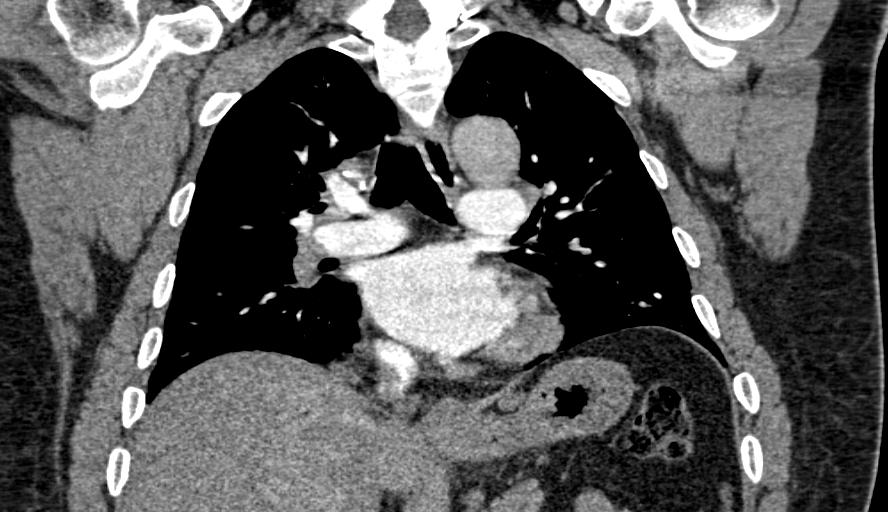

[Series 10: thins for pacs · axial · 0.74mm/px · z∈[+1366,+1594]mm · 17 of 254 slices shown]
[im 13/254  lung]
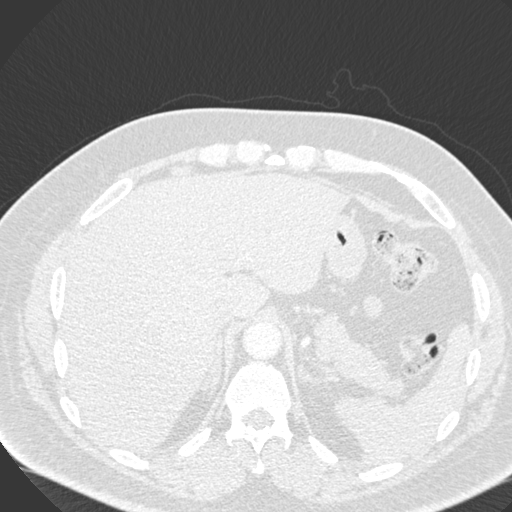
[im 26/254  mediastinal]
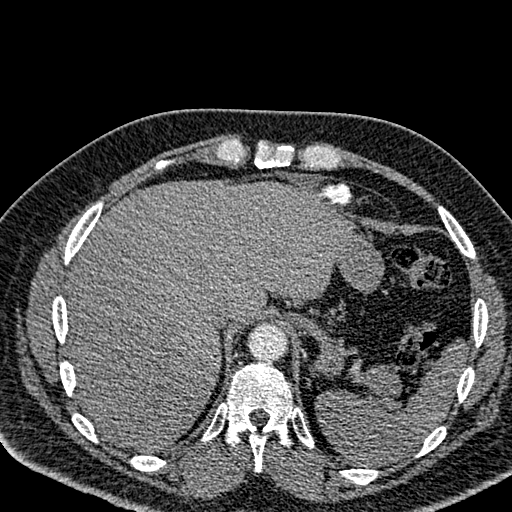
[im 38/254  lung]
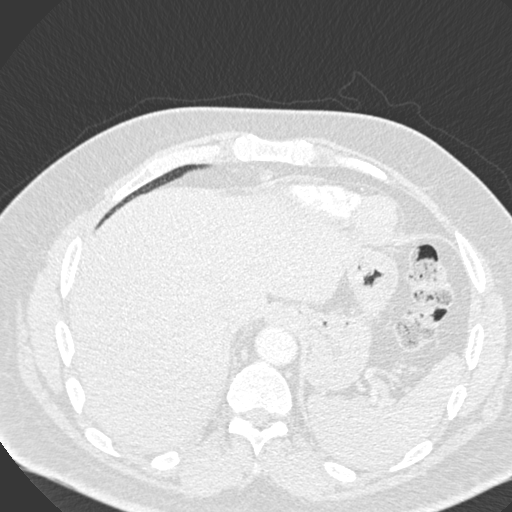
[im 51/254  mediastinal]
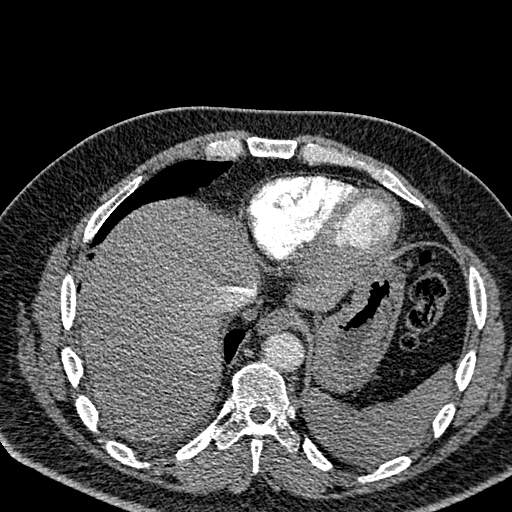
[im 76/254  lung]
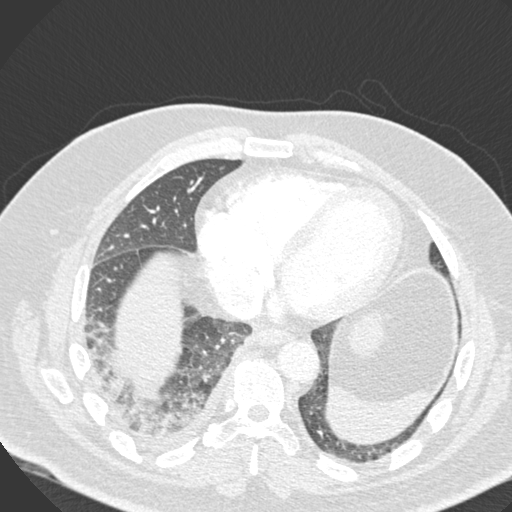
[im 89/254  mediastinal]
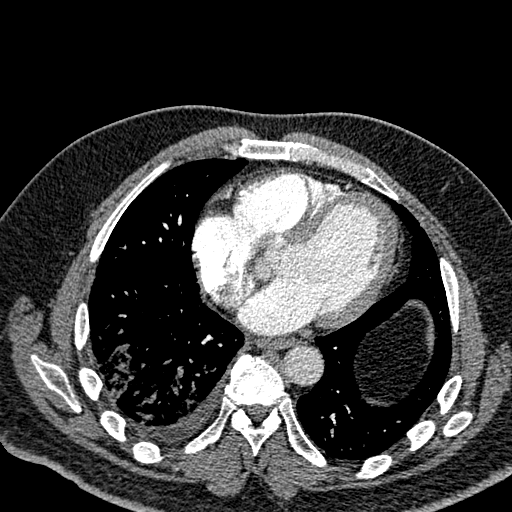
[im 102/254  lung]
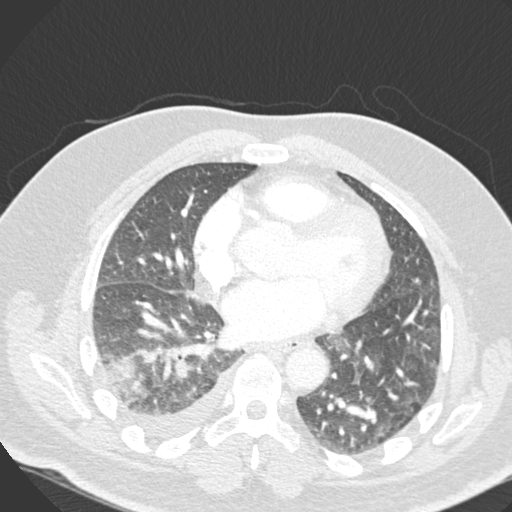
[im 114/254  mediastinal]
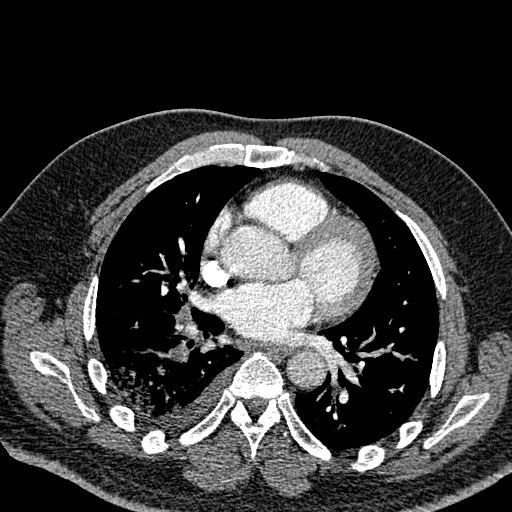
[im 127/254  lung]
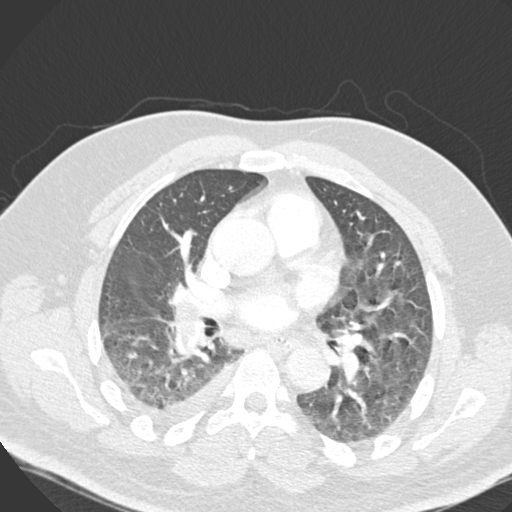
[im 140/254  mediastinal]
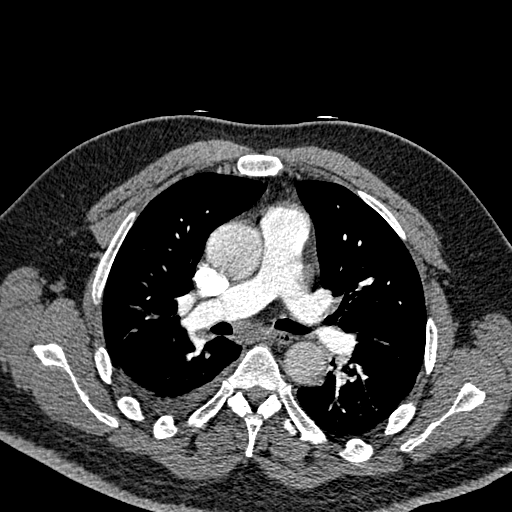
[im 152/254  lung]
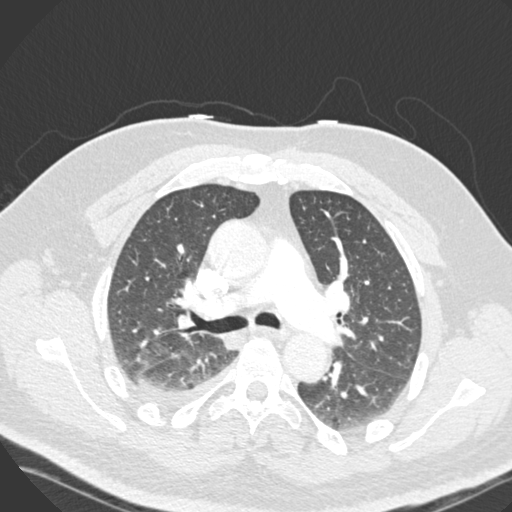
[im 165/254  mediastinal]
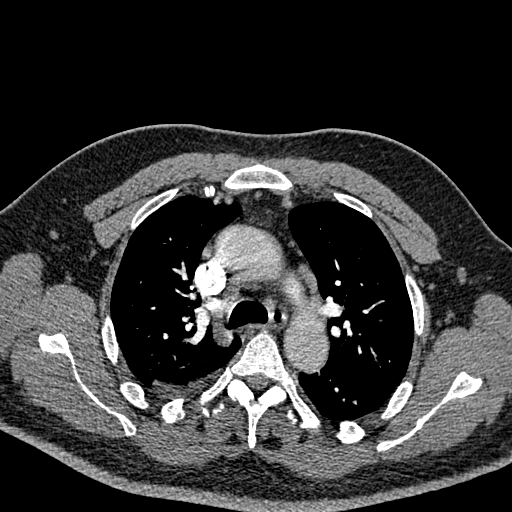
[im 178/254  lung]
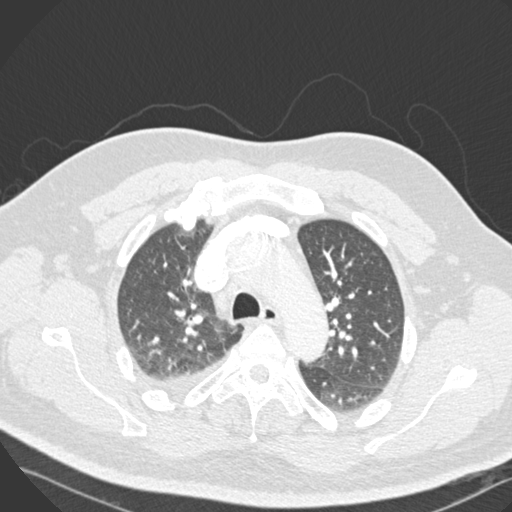
[im 203/254  mediastinal]
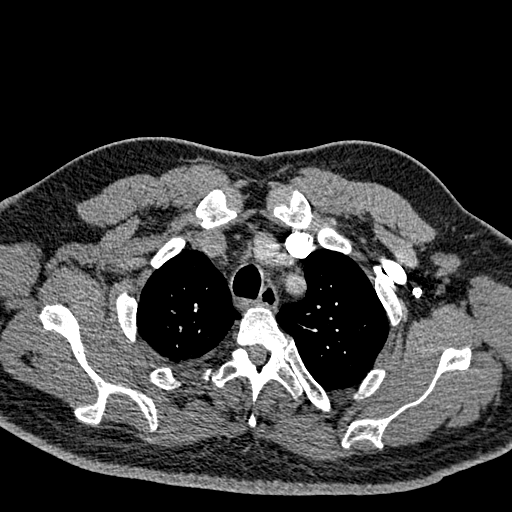
[im 216/254  lung]
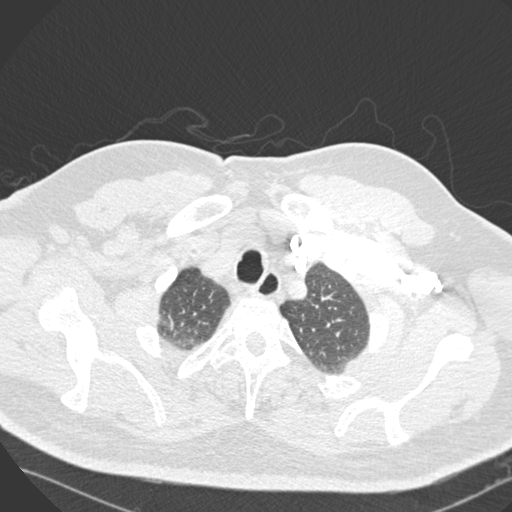
[im 228/254  mediastinal]
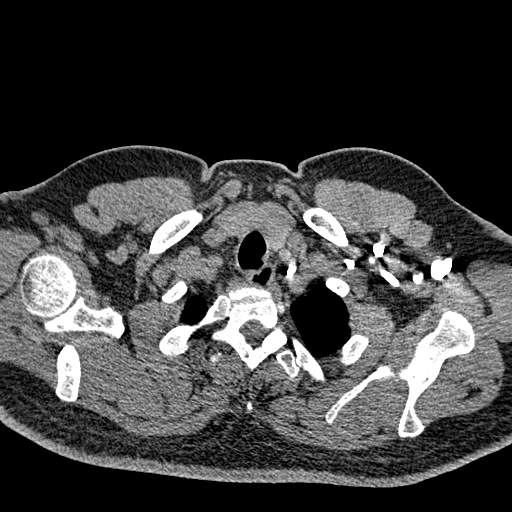
[im 241/254  lung]
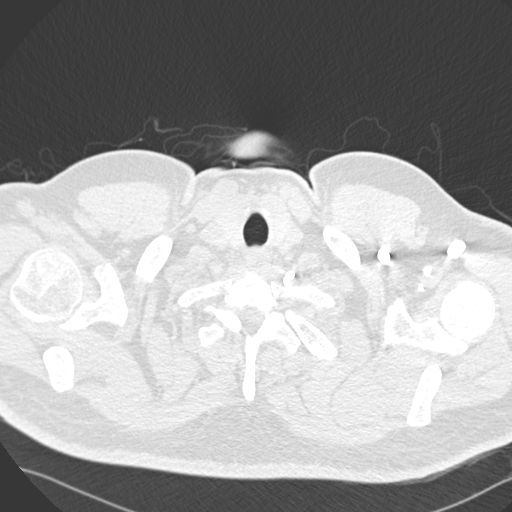

[18 of 36 positions shown; findings below may reference images not displayed]

FINDINGS: Technically adequate study with good opacification of the central
and segmental pulmonary arteries. Filling defects are demonstrated
in the distal right main pulmonary artery, extending into the right
lower lobe and focally and a right upper lobe segmental branch
vessels. No definite emboli on the left. Infiltration in the right
lung base with small right pleural effusion. This could be due to
infarct or pneumonia. Left lung is clear, allowing for motion
artifact. No evidence of right heart strain.

Normal heart size. Normal caliber thoracic aorta. No significant
lymphadenopathy in the chest. Visualize lymph nodes are not
pathologically enlarged. Esophagus is decompressed.

No pneumothorax. Included portions of the upper abdominal organs are
grossly unremarkable. Degenerative changes in the spine. No
destructive bone lesions.

Review of the MIP images confirms the above findings.
IMPRESSION: Positive for pulmonary embolus. Emboli demonstrated in the distal
right main and right lower lobe segmental branches.

These results were called by telephone at the time of interpretation
on 11/20/2014 at [DATE] to Dr. WOODY JUMPER , who verbally
acknowledged these results.

## 2019-02-23 ENCOUNTER — Encounter (HOSPITAL_BASED_OUTPATIENT_CLINIC_OR_DEPARTMENT_OTHER): Payer: Self-pay | Admitting: Emergency Medicine

## 2019-02-23 ENCOUNTER — Emergency Department (HOSPITAL_BASED_OUTPATIENT_CLINIC_OR_DEPARTMENT_OTHER)
Admission: EM | Admit: 2019-02-23 | Discharge: 2019-02-23 | Disposition: A | Discharge status: Home / Self Care | Payer: No Typology Code available for payment source | Attending: Emergency Medicine | Admitting: Emergency Medicine

## 2019-02-23 ENCOUNTER — Emergency Department (HOSPITAL_BASED_OUTPATIENT_CLINIC_OR_DEPARTMENT_OTHER): Payer: No Typology Code available for payment source

## 2019-02-23 ENCOUNTER — Other Ambulatory Visit: Payer: Self-pay

## 2019-02-23 DIAGNOSIS — Z86711 Personal history of pulmonary embolism: Secondary | ICD-10-CM | POA: Insufficient documentation

## 2019-02-23 DIAGNOSIS — N182 Chronic kidney disease, stage 2 (mild): Secondary | ICD-10-CM | POA: Insufficient documentation

## 2019-02-23 DIAGNOSIS — Z91041 Radiographic dye allergy status: Secondary | ICD-10-CM | POA: Diagnosis not present

## 2019-02-23 DIAGNOSIS — Z79899 Other long term (current) drug therapy: Secondary | ICD-10-CM | POA: Insufficient documentation

## 2019-02-23 DIAGNOSIS — R0602 Shortness of breath: Secondary | ICD-10-CM

## 2019-02-23 DIAGNOSIS — R519 Headache, unspecified: Secondary | ICD-10-CM | POA: Diagnosis not present

## 2019-02-23 DIAGNOSIS — E1165 Type 2 diabetes mellitus with hyperglycemia: Secondary | ICD-10-CM | POA: Insufficient documentation

## 2019-02-23 DIAGNOSIS — M25511 Pain in right shoulder: Secondary | ICD-10-CM | POA: Insufficient documentation

## 2019-02-23 DIAGNOSIS — Z794 Long term (current) use of insulin: Secondary | ICD-10-CM | POA: Diagnosis not present

## 2019-02-23 DIAGNOSIS — R42 Dizziness and giddiness: Secondary | ICD-10-CM | POA: Diagnosis not present

## 2019-02-23 DIAGNOSIS — E1122 Type 2 diabetes mellitus with diabetic chronic kidney disease: Secondary | ICD-10-CM | POA: Insufficient documentation

## 2019-02-23 DIAGNOSIS — R739 Hyperglycemia, unspecified: Secondary | ICD-10-CM

## 2019-02-23 LAB — COMPREHENSIVE METABOLIC PANEL
ALT: 20 U/L (ref 0–44)
AST: 33 U/L (ref 15–41)
Albumin: 3.8 g/dL (ref 3.5–5.0)
Alkaline Phosphatase: 67 U/L (ref 38–126)
Anion gap: 8 (ref 5–15)
BUN: 21 mg/dL — ABNORMAL HIGH (ref 6–20)
CO2: 24 mmol/L (ref 22–32)
Calcium: 9.1 mg/dL (ref 8.9–10.3)
Chloride: 102 mmol/L (ref 98–111)
Creatinine, Ser: 1.4 mg/dL — ABNORMAL HIGH (ref 0.61–1.24)
GFR calc Af Amer: >60 mL/min (ref >60)
GFR calc non Af Amer: 56 mL/min — ABNORMAL LOW (ref >60)
Glucose, Bld: 346 mg/dL — ABNORMAL HIGH (ref 70–99)
Potassium: 4.7 mmol/L (ref 3.5–5.1)
Sodium: 134 mmol/L — ABNORMAL LOW (ref 135–145)
Total Bilirubin: 0.4 mg/dL (ref 0.3–1.2)
Total Protein: 8.1 g/dL (ref 6.5–8.1)

## 2019-02-23 LAB — CBG MONITORING, ED
Glucose-Capillary: 359 mg/dL — ABNORMAL HIGH (ref 70–99)
Glucose-Capillary: 246 mg/dL — ABNORMAL HIGH (ref 70–99)

## 2019-02-23 LAB — CBC WITH DIFFERENTIAL/PLATELET
Abs Immature Granulocytes: 0.03 10*3/uL (ref 0.00–0.07)
Basophils Absolute: 0 10*3/uL (ref 0.0–0.1)
Basophils Relative: 0 %
Eosinophils Absolute: 0.1 10*3/uL (ref 0.0–0.5)
Eosinophils Relative: 1 %
HCT: 45.4 % (ref 39.0–52.0)
Hemoglobin: 15.3 g/dL (ref 13.0–17.0)
Immature Granulocytes: 0 %
Lymphocytes Relative: 18 %
Lymphs Abs: 1.7 10*3/uL (ref 0.7–4.0)
MCH: 31 pg (ref 26.0–34.0)
MCHC: 33.7 g/dL (ref 30.0–36.0)
MCV: 92.1 fL (ref 80.0–100.0)
Monocytes Absolute: 0.5 10*3/uL (ref 0.1–1.0)
Monocytes Relative: 5 %
Neutro Abs: 7.3 10*3/uL (ref 1.7–7.7)
Neutrophils Relative %: 76 %
Platelets: 186 10*3/uL (ref 150–400)
RBC: 4.93 MIL/uL (ref 4.22–5.81)
RDW: 12.9 % (ref 11.5–15.5)
WBC: 9.6 10*3/uL (ref 4.0–10.5)
nRBC: 0 % (ref 0.0–0.2)

## 2019-02-23 LAB — TROPONIN I (HIGH SENSITIVITY)
Troponin I (High Sensitivity): 4 ng/L (ref <18)
Troponin I (High Sensitivity): 4 ng/L (ref <18)

## 2019-02-23 LAB — LIPASE, BLOOD: Lipase: 80 U/L — ABNORMAL HIGH (ref 11–51)

## 2019-02-23 MED ORDER — SODIUM CHLORIDE 0.9 % IV BOLUS
500.0000 mL | Freq: Once | INTRAVENOUS | Status: AC
  Administered 2019-02-23: 500 mL via INTRAVENOUS

## 2019-02-23 MED ORDER — INSULIN REGULAR HUMAN 100 UNIT/ML IJ SOLN
10.0000 [IU] | Freq: Once | INTRAMUSCULAR | Status: AC
  Administered 2019-02-23: 10 [IU] via SUBCUTANEOUS
  Filled 2019-02-23: qty 1

## 2019-02-23 MED ORDER — CYCLOBENZAPRINE HCL 10 MG PO TABS: 10.0000 mg | ORAL_TABLET | Freq: Two times a day (BID) | ORAL | 0 refills | Status: DC | PRN

## 2019-02-23 MED ORDER — SODIUM CHLORIDE 0.9 % IV SOLN: INTRAVENOUS | Status: DC

## 2019-02-23 MED ORDER — INSULIN GLARGINE 100 UNIT/ML SOLOSTAR PEN: 40.0000 [IU] | PEN_INJECTOR | Freq: Two times a day (BID) | SUBCUTANEOUS | 1 refills | Status: DC

## 2019-02-23 MED ORDER — IOHEXOL 350 MG/ML SOLN
100.0000 mL | Freq: Once | INTRAVENOUS | Status: AC | PRN
  Administered 2019-02-23: 100 mL via INTRAVENOUS
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
## 2019-02-23 NOTE — ED Notes (Signed)
Pt. Reports his blood sugar has been elevated and he has not been able to get it down.  Pt. Has an appt. On the 28th of Dec to discuss his blood sugars.    Pt. Has concern about his headaches that started yesterday.  Pt. In no distress.

## 2019-02-23 NOTE — ED Notes (Signed)
Pt. Reports he does not eat the way he should.Marland Kitchen teaching done with pt. On Diabetes and eating and the results of not eating a diabetic diet.   Pt. Reports he has a wife who is an Therapist, sports and she helps him with his diet.

## 2019-02-23 NOTE — ED Notes (Signed)
ED Provider at bedside. 
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
## 2019-02-23 NOTE — Discharge Instructions (Addendum)
Keep your appointment with your primary care doctor for 20 December.  In the meantime recommending increasing your Lantus insulin 40 units twice a day.  Continue with your irregular insulin as a sliding dose.  Take Flexeril as needed for the muscle discomfort.  Work-up here today without any acute findings CT angio chest was negative CT head negative.  If dizziness symptoms persist will need MRI.  Return for any new or worse symptoms.
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
## 2019-02-23 NOTE — ED Notes (Signed)
Pt. Report he breaks out in a cold sweat at times and has trouble catching his breath at times walking up his steps.  No  To report of chest pain.

## 2019-02-24 MED FILL — Insulin Regular (Human) Inj 100 Unit/ML: INTRAMUSCULAR | Qty: 0.1 | Status: AC

## 2022-06-11 DIAGNOSIS — E1165 Type 2 diabetes mellitus with hyperglycemia: Secondary | ICD-10-CM | POA: Diagnosis not present

## 2022-06-25 DIAGNOSIS — E1165 Type 2 diabetes mellitus with hyperglycemia: Secondary | ICD-10-CM | POA: Diagnosis not present

## 2022-07-04 ENCOUNTER — Inpatient Hospital Stay (HOSPITAL_BASED_OUTPATIENT_CLINIC_OR_DEPARTMENT_OTHER)
Admission: EM | Admit: 2022-07-04 | Discharge: 2022-07-08 | DRG: 292 | Disposition: A | Discharge status: Home / Self Care | Payer: BC Managed Care – PPO | Attending: Family Medicine | Admitting: Family Medicine

## 2022-07-04 ENCOUNTER — Encounter (HOSPITAL_BASED_OUTPATIENT_CLINIC_OR_DEPARTMENT_OTHER): Payer: Self-pay | Admitting: Pediatrics

## 2022-07-04 ENCOUNTER — Other Ambulatory Visit: Payer: Self-pay

## 2022-07-04 ENCOUNTER — Emergency Department (HOSPITAL_BASED_OUTPATIENT_CLINIC_OR_DEPARTMENT_OTHER): Payer: BC Managed Care – PPO

## 2022-07-04 DIAGNOSIS — Z833 Family history of diabetes mellitus: Secondary | ICD-10-CM | POA: Diagnosis not present

## 2022-07-04 DIAGNOSIS — Z91041 Radiographic dye allergy status: Secondary | ICD-10-CM | POA: Diagnosis not present

## 2022-07-04 DIAGNOSIS — I2510 Atherosclerotic heart disease of native coronary artery without angina pectoris: Secondary | ICD-10-CM | POA: Diagnosis not present

## 2022-07-04 DIAGNOSIS — T380X5A Adverse effect of glucocorticoids and synthetic analogues, initial encounter: Secondary | ICD-10-CM | POA: Diagnosis not present

## 2022-07-04 DIAGNOSIS — Z794 Long term (current) use of insulin: Secondary | ICD-10-CM | POA: Diagnosis not present

## 2022-07-04 DIAGNOSIS — R0602 Shortness of breath: Secondary | ICD-10-CM | POA: Diagnosis not present

## 2022-07-04 DIAGNOSIS — I5021 Acute systolic (congestive) heart failure: Secondary | ICD-10-CM

## 2022-07-04 DIAGNOSIS — Z7901 Long term (current) use of anticoagulants: Secondary | ICD-10-CM

## 2022-07-04 DIAGNOSIS — E869 Volume depletion, unspecified: Secondary | ICD-10-CM | POA: Diagnosis not present

## 2022-07-04 DIAGNOSIS — I5041 Acute combined systolic (congestive) and diastolic (congestive) heart failure: Secondary | ICD-10-CM | POA: Diagnosis not present

## 2022-07-04 DIAGNOSIS — N182 Chronic kidney disease, stage 2 (mild): Secondary | ICD-10-CM | POA: Diagnosis present

## 2022-07-04 DIAGNOSIS — E1122 Type 2 diabetes mellitus with diabetic chronic kidney disease: Secondary | ICD-10-CM | POA: Diagnosis present

## 2022-07-04 DIAGNOSIS — Z8249 Family history of ischemic heart disease and other diseases of the circulatory system: Secondary | ICD-10-CM

## 2022-07-04 DIAGNOSIS — E1165 Type 2 diabetes mellitus with hyperglycemia: Secondary | ICD-10-CM | POA: Diagnosis present

## 2022-07-04 DIAGNOSIS — I428 Other cardiomyopathies: Secondary | ICD-10-CM | POA: Diagnosis present

## 2022-07-04 DIAGNOSIS — Z86711 Personal history of pulmonary embolism: Secondary | ICD-10-CM

## 2022-07-04 DIAGNOSIS — I509 Heart failure, unspecified: Secondary | ICD-10-CM

## 2022-07-04 DIAGNOSIS — R7989 Other specified abnormal findings of blood chemistry: Secondary | ICD-10-CM | POA: Diagnosis not present

## 2022-07-04 DIAGNOSIS — J810 Acute pulmonary edema: Principal | ICD-10-CM

## 2022-07-04 DIAGNOSIS — E119 Type 2 diabetes mellitus without complications: Secondary | ICD-10-CM

## 2022-07-04 DIAGNOSIS — Z597 Insufficient social insurance and welfare support: Secondary | ICD-10-CM

## 2022-07-04 HISTORY — DX: Heart failure, unspecified: I50.9

## 2022-07-04 LAB — PROTIME-INR
INR: 1.1 (ref 0.8–1.2)
Prothrombin Time: 13.9 seconds (ref 11.4–15.2)

## 2022-07-04 LAB — CBC WITH DIFFERENTIAL/PLATELET
Abs Immature Granulocytes: 0.02 10*3/uL (ref 0.00–0.07)
Basophils Absolute: 0 10*3/uL (ref 0.0–0.1)
Basophils Relative: 0 %
Eosinophils Absolute: 0.1 10*3/uL (ref 0.0–0.5)
Eosinophils Relative: 1 %
HCT: 40.9 % (ref 39.0–52.0)
Hemoglobin: 13.3 g/dL (ref 13.0–17.0)
Immature Granulocytes: 0 %
Lymphocytes Relative: 25 %
Lymphs Abs: 1.9 10*3/uL (ref 0.7–4.0)
MCH: 30.9 pg (ref 26.0–34.0)
MCHC: 32.5 g/dL (ref 30.0–36.0)
MCV: 94.9 fL (ref 80.0–100.0)
Monocytes Absolute: 0.4 10*3/uL (ref 0.1–1.0)
Monocytes Relative: 5 %
Neutro Abs: 5.4 10*3/uL (ref 1.7–7.7)
Neutrophils Relative %: 69 %
Platelets: 184 10*3/uL (ref 150–400)
RBC: 4.31 MIL/uL (ref 4.22–5.81)
RDW: 13.3 % (ref 11.5–15.5)
WBC: 7.8 10*3/uL (ref 4.0–10.5)
nRBC: 0 % (ref 0.0–0.2)

## 2022-07-04 LAB — COMPREHENSIVE METABOLIC PANEL
ALT: 18 U/L (ref 0–44)
AST: 36 U/L (ref 15–41)
Albumin: 3.6 g/dL (ref 3.5–5.0)
Alkaline Phosphatase: 76 U/L (ref 38–126)
Anion gap: 8 (ref 5–15)
BUN: 17 mg/dL (ref 6–20)
CO2: 24 mmol/L (ref 22–32)
Calcium: 8.4 mg/dL — ABNORMAL LOW (ref 8.9–10.3)
Chloride: 105 mmol/L (ref 98–111)
Creatinine, Ser: 1.24 mg/dL (ref 0.61–1.24)
GFR, Estimated: >60 mL/min (ref >60)
Glucose, Bld: 120 mg/dL — ABNORMAL HIGH (ref 70–99)
Potassium: 4.1 mmol/L (ref 3.5–5.1)
Sodium: 137 mmol/L (ref 135–145)
Total Bilirubin: 0.6 mg/dL (ref 0.3–1.2)
Total Protein: 7.8 g/dL (ref 6.5–8.1)

## 2022-07-04 LAB — GLUCOSE, CAPILLARY: Glucose-Capillary: 191 mg/dL — ABNORMAL HIGH (ref 70–99)

## 2022-07-04 LAB — TROPONIN I (HIGH SENSITIVITY)
Troponin I (High Sensitivity): 36 ng/L — ABNORMAL HIGH (ref <18)
Troponin I (High Sensitivity): 31 ng/L — ABNORMAL HIGH (ref <18)

## 2022-07-04 LAB — BRAIN NATRIURETIC PEPTIDE: B Natriuretic Peptide: 596.1 pg/mL — ABNORMAL HIGH (ref 0.0–100.0)

## 2022-07-04 MED ORDER — ALBUTEROL SULFATE HFA 108 (90 BASE) MCG/ACT IN AERS: 2.0000 | INHALATION_SPRAY | RESPIRATORY_TRACT | Status: DC | PRN

## 2022-07-04 MED ORDER — SENNOSIDES-DOCUSATE SODIUM 8.6-50 MG PO TABS: 1.0000 | ORAL_TABLET | Freq: Every evening | ORAL | Status: DC | PRN

## 2022-07-04 MED ORDER — ALBUTEROL SULFATE (2.5 MG/3ML) 0.083% IN NEBU: 2.5000 mg | INHALATION_SOLUTION | RESPIRATORY_TRACT | Status: DC | PRN

## 2022-07-04 MED ORDER — ACETAMINOPHEN 325 MG PO TABS: 650.0000 mg | ORAL_TABLET | Freq: Four times a day (QID) | ORAL | Status: DC | PRN

## 2022-07-04 MED ORDER — ONDANSETRON HCL 4 MG/2ML IJ SOLN
4.0000 mg | Freq: Four times a day (QID) | INTRAMUSCULAR | Status: DC | PRN
  Administered 2022-07-08 (×2): 4 mg via INTRAVENOUS
  Filled 2022-07-04 (×2): qty 2

## 2022-07-04 MED ORDER — INSULIN ASPART 100 UNIT/ML IJ SOLN
0.0000 [IU] | Freq: Every day | INTRAMUSCULAR | Status: DC
  Administered 2022-07-07: 3 [IU] via SUBCUTANEOUS

## 2022-07-04 MED ORDER — FUROSEMIDE 10 MG/ML IJ SOLN
40.0000 mg | Freq: Once | INTRAMUSCULAR | Status: AC
  Administered 2022-07-04: 40 mg via INTRAVENOUS
  Filled 2022-07-04: qty 4

## 2022-07-04 MED ORDER — ONDANSETRON HCL 4 MG/2ML IJ SOLN
4.0000 mg | Freq: Once | INTRAMUSCULAR | Status: AC
  Administered 2022-07-04: 4 mg via INTRAVENOUS
  Filled 2022-07-04: qty 2

## 2022-07-04 MED ORDER — NITROGLYCERIN 0.4 MG SL SUBL
0.4000 mg | SUBLINGUAL_TABLET | Freq: Once | SUBLINGUAL | Status: AC
  Administered 2022-07-04: 0.4 mg via SUBLINGUAL
  Filled 2022-07-04: qty 1

## 2022-07-04 MED ORDER — ONDANSETRON HCL 4 MG PO TABS: 4.0000 mg | ORAL_TABLET | Freq: Four times a day (QID) | ORAL | Status: DC | PRN

## 2022-07-04 MED ORDER — POTASSIUM CHLORIDE CRYS ER 10 MEQ PO TBCR: 10.0000 meq | EXTENDED_RELEASE_TABLET | Freq: Every day | ORAL | Status: DC

## 2022-07-04 MED ORDER — IOHEXOL 350 MG/ML SOLN
100.0000 mL | Freq: Once | INTRAVENOUS | Status: AC | PRN
  Administered 2022-07-04: 100 mL via INTRAVENOUS

## 2022-07-04 MED ORDER — ENOXAPARIN SODIUM 60 MG/0.6ML IJ SOSY
60.0000 mg | PREFILLED_SYRINGE | INTRAMUSCULAR | Status: DC
  Administered 2022-07-04 – 2022-07-07 (×4): 60 mg via SUBCUTANEOUS
  Filled 2022-07-04 (×4): qty 0.6

## 2022-07-04 MED ORDER — SODIUM CHLORIDE 0.9% FLUSH
3.0000 mL | Freq: Two times a day (BID) | INTRAVENOUS | Status: DC
  Administered 2022-07-04 – 2022-07-08 (×8): 3 mL via INTRAVENOUS

## 2022-07-04 MED ORDER — INSULIN ASPART 100 UNIT/ML IJ SOLN
0.0000 [IU] | Freq: Three times a day (TID) | INTRAMUSCULAR | Status: DC
  Administered 2022-07-05 – 2022-07-06 (×3): 1 [IU] via SUBCUTANEOUS
  Administered 2022-07-06 – 2022-07-07 (×2): 2 [IU] via SUBCUTANEOUS
  Administered 2022-07-07 (×2): 5 [IU] via SUBCUTANEOUS
  Administered 2022-07-08: 2 [IU] via SUBCUTANEOUS

## 2022-07-04 MED ORDER — FUROSEMIDE 10 MG/ML IJ SOLN
40.0000 mg | Freq: Two times a day (BID) | INTRAMUSCULAR | Status: DC
  Administered 2022-07-04 – 2022-07-06 (×5): 40 mg via INTRAVENOUS
  Filled 2022-07-04 (×5): qty 4

## 2022-07-04 MED ORDER — ACETAMINOPHEN 650 MG RE SUPP: 650.0000 mg | Freq: Four times a day (QID) | RECTAL | Status: DC | PRN

## 2022-07-04 NOTE — Progress Notes (Signed)
62 year old male with past medical history of PE, no longer on AC who presents with worsening shortness of breath and orthopnea for the past week with intermittent chest discomfort and some bilateral lower extremity edema.  Not hypoxic but was placed on 2 L nasal cannula for comfort.  Labs showed elevated troponin at 36 and a BNP of 596.  Chest x-ray shows diffuse edema.  CTA chest negative for PE, but more consistent with CHF and developing pulmonary edema.  Patient was given 1 dose of IV Lasix.  Patient will need admission for new onset CHF.  No known prior history or echo done in the past.  Patient will need telemetry inpatient bed.

## 2022-07-04 NOTE — ED Triage Notes (Signed)
C/O shortness of breath and difficulty catching breath for a while but worst today.

## 2022-07-04 NOTE — H&P (Signed)
History and Physical    Jacob Donaldson NWG:[id number] DOB: 03/18/1960 DOA: 07/04/2022  PCP: Jacob Fick, MD  Patient coming from: Home  I have personally briefly reviewed patient's old medical records in [facility]  Chief Complaint: Shortness of breath  HPI: Jacob Donaldson is a 62 y.o. male with medical history significant for T2DM, history of PE no longer on anticoagulation who presented to the ED for evaluation of shortness of breath.  Patient reports 1 week of progressive shortness of breath.  Initially was experiencing exertional dyspnea but over the last couple days has been having dyspnea at rest, worse when lying supine.  He has been waking up short of breath.  He has had an associated nonproductive cough.  He has not had any chest pain.  He has not seen any significant swelling in his legs.  He denies any prior history of cardiac disease.  He is not taking any medications regularly.  Unfortunately patient states that his wife recently passed away from heart failure.  [facility] ED Course  Labs/Imaging on admission: I have personally reviewed following labs and imaging studies.  Initial showed BP 155/95, pulse 89, RR 23, temp 97.6 F, SpO2 100% on 2 L O2 via [place].  Labs show WBC 7.8, hemoglobin 13.3, platelets 184,000, sodium 137, potassium 4.1, bicarb 24, BUN 17, creatinine 1.24, serum glucose 120, LFTs within normal limits, BNP 596.1, troponin 36 point 31.  CTA chest negative for evidence of PE.  Findings consistent with CHF and developing pulmonary edema noted.  Small bilateral pleural effusions with loculated fluid in the right major and minor fissure seen.  Patient was given IV Lasix 40 mg, sublingual nitroglycerin.  The hospitalist service was consulted to admit for further evaluation and management.  Review of Systems: All systems reviewed and are negative except as documented in history of present illness above.   Past Medical History:  Diagnosis  Date   Acute CHF (congestive heart failure) (HCC) 07/04/2022   CKD (chronic kidney disease), stage II    Diabetes mellitus without complication (HCC)    Pulmonary embolism (HCC)     History reviewed. No pertinent surgical history.  Social History:  reports that he has never smoked. He has never used smokeless tobacco. He reports that he does not drink alcohol and does not use drugs.  Allergies  Allergen Reactions   Contrast Media [Iodinated Contrast Media] Nausea And Vomiting and Other (See Comments)    Vomiting after administration of IV contrast, pt states this happens "every time". Felt better afterwards.     Family History  Problem Relation Age of Onset   Heart disease Mother    Diabetes Mellitus II Maternal Grandmother      Prior to Admission medications   Medication Sig Start Date End Date Taking? Authorizing Provider  cyclobenzaprine (FLEXERIL) 10 MG tablet Take 1 tablet (10 mg total) by mouth 2 (two) times daily as needed for muscle spasms. 02/23/19   Vanetta Mulders, MD  insulin aspart (NOVOLOG FLEXPEN) 100 UNIT/ML FlexPen Before each meal 3 times a day, 140-199 - 2 units, 200-250 - 4 units, 251-299 - 6 units,  300-349 - 8 units,  350 or above 10 units. Insulin PEN if approved, provide syringes and needles if needed. 10/11/14   Albertine Grates, MD  Insulin Glargine (LANTUS SOLOSTAR) 100 UNIT/ML Solostar Pen Inject 20 Units into the skin 2 (two) times daily. 05/10/15   Elgergawy, Leana Roe, MD  Insulin Glargine (LANTUS) 100 UNIT/ML Solostar Pen Inject  40 Units into the skin 2 (two) times daily. 02/23/19   Vanetta Mulders, MD  oxyCODONE-acetaminophen (PERCOCET/ROXICET) 5-325 MG tablet Take 1 tablet by mouth every 4 (four) hours as needed for severe pain. 02/10/16   Cheri Fowler, PA-C  polyethylene glycol [facility] packet Take 17 g by mouth daily. 05/10/15   Elgergawy, Leana Roe, MD  rivaroxaban (XARELTO) 20 MG TABS tablet Take 20 mg by mouth daily with supper.    [provider]     Physical Exam: Vitals:   07/04/22 1600 07/04/22 1645 07/04/22 1708 07/04/22 1806  BP: (!) 157/118  (!) 144/97 (!) 152/113  Pulse: 88 91 89 95  Resp: (!) 23 (!) 24 (!) 23 20  Temp:  98.1 F (36.7 C)  98.8 F (37.1 C)  TempSrc:    Oral  SpO2: 99% 100% 100% 96%  Weight:      Height:       Constitutional: Sitting up in bed, NAD, calm, comfortable Eyes: EOMI, lids and conjunctivae normal ENMT: Mucous membranes are moist. Posterior pharynx clear of any exudate or lesions.Normal dentition.  Neck: normal, supple, no masses. Respiratory: Bibasilar inspiratory crackles. Normal respiratory effort. No accessory muscle use.  Cardiovascular: Regular rate and rhythm, no murmurs / rubs / gallops. No extremity edema. 2+ pedal pulses. Abdomen: no tenderness, no masses palpated. Musculoskeletal: no clubbing / cyanosis. No joint deformity upper and lower extremities. Good ROM, no contractures. Normal muscle tone.  Skin: no rashes, lesions, ulcers. No induration Neurologic: Sensation intact. Strength 5/5 in all 4.  Psychiatric: Alert and oriented x 3. Normal mood.   EKG: Personally reviewed. Sinus rhythm, rate 90, no acute ischemic changes.  Assessment/Plan Principal Problem:   Acute CHF (congestive heart failure) (HCC) Active Problems:   Type 2 diabetes mellitus (HCC)   Jacob Donaldson is a 62 y.o. 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  Assessment and Plan: * Acute CHF (congestive heart failure) (HCC) Presenting with DOE, orthopnea, elevated BNP.  CT consistent with CHF and pulmonary edema.  No evidence of PE.  Mildly elevated troponin likely demand ischemia in setting of new diagnosis of acute CHF.  He has not had any chest pain.  No prior echo on file, unspecified whether systolic or diastolic. -Continue IV Lasix 40 mg twice daily -Obtain echocardiogram -Strict I/O's and daily weights -Further  GDMT pending echo results  Type 2 diabetes mellitus (HCC) Patient states he has not taken any medications for diabetes. -Placed on SSI -Check A1c  DVT prophylaxis: enoxaparin (LOVENOX) injection 40 mg Start: 07/04/22 2015 Code Status: Full code, confirmed with patient Family Communication: None, patient prefers not to notify family at time of admission Disposition Plan: Home and likely discharge to home pending clinical progress Consults called: None Severity of Illness: The appropriate patient status for this patient is INPATIENT. Inpatient status is judged to be reasonable and necessary in order to provide the required intensity of service to ensure the patient's safety. The patient's presenting symptoms, physical exam findings, and initial radiographic and laboratory data in the context of their chronic comorbidities is felt to place them at high risk for further clinical deterioration. Furthermore, it is not anticipated that the patient will be medically stable for discharge from the hospital within 2 midnights of admission.   * I certify that at the point of admission it is my clinical judgment that the patient will require inpatient hospital care spanning  beyond 2 midnights from the point of admission due to high intensity of service, high risk for further deterioration and high frequency of surveillance required.Darreld Mclean MD [facility]  If 7PM-7AM, please contact night-coverage [email or web address]  07/04/2022, 7:29 PM

## 2022-07-04 NOTE — ED Provider Notes (Signed)
Blood pressure (!) 155/95, pulse 89, temperature 97.6 F (36.4 C), temperature source Oral, resp. rate (!) 23, height  (2.007 m), weight 132.8 kg, SpO2 100 %.  Assuming care from Dr. Wilkie Aye.  In short, Jacob Donaldson is a 62 y.o. male with a chief complaint of Shortness of Breath .  Refer to the original H&P for additional details.  The current plan of care is to follow up CTA and admit.   CTA without PE. Patient with pulmonary edema on scan. Plan for admit for new CHF exacerbation. Patient in agreement.   Discussed patient's case with [facility] to request admission. Patient and family (if present) updated with plan.   I reviewed all nursing notes, vitals, pertinent old records, EKGs, labs, imaging (as available).    Maia Plan, MD 07/04/22 [phone]

## 2022-07-04 NOTE — Progress Notes (Signed)
Pt arrived from [facility] via [facility]. VSS on RA. Pt independently ambulatory in room. Oriented to callbell and environment. Flow manager notified of pt arrival and need for admission orders, responded Dr Allena Katz will be admitting pt. SO at bedside. Pt with no c/o at present.

## 2022-07-04 NOTE — Assessment & Plan Note (Signed)
Patient states he has not taken any medications for diabetes. -Placed on SSI -Check A1c

## 2022-07-04 NOTE — Hospital Course (Signed)
Jacob Donaldson is a 62 y.o. 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

## 2022-07-04 NOTE — Assessment & Plan Note (Signed)
Presenting with DOE, orthopnea, elevated BNP.  CT consistent with CHF and pulmonary edema.  No evidence of PE.  Mildly elevated troponin likely demand ischemia in setting of new diagnosis of acute CHF.  He has not had any chest pain.  No prior echo on file, unspecified whether systolic or diastolic. -Continue IV Lasix 40 mg twice daily -Obtain echocardiogram -Strict I/O's and daily weights -Further GDMT pending echo results

## 2022-07-04 NOTE — ED Provider Notes (Signed)
[facility] Provider Note   CSN: [id number] Arrival date & time: 07/04/22  1256     History  Chief Complaint  Patient presents with   Shortness of Breath    Jacob Donaldson is a 62 y.o. male.  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  Patient states initially it was exertional but now it is all the time, worse with lying flat.  He feels like he cannot get a deep breath or get a full amount of oxygen.  No specific chest pain or back pain.  No significant swelling of his lower extremities.  No recent fever or illness.  No other acute change in his health.  Home Medications Prior to Admission medications   Medication Sig Start Date End Date Taking? Authorizing Provider  cyclobenzaprine (FLEXERIL) 10 MG tablet Take 1 tablet (10 mg total) by mouth 2 (two) times daily as needed for muscle spasms. 02/23/19   Vanetta Mulders, MD  insulin aspart (NOVOLOG FLEXPEN) 100 UNIT/ML FlexPen Before each meal 3 times a day, 140-199 - 2 units, 200-250 - 4 units, 251-299 - 6 units,  300-349 - 8 units,  350 or above 10 units. Insulin PEN if approved, provide syringes and needles if needed. 10/11/14   Albertine Grates, MD  Insulin Glargine (LANTUS SOLOSTAR) 100 UNIT/ML Solostar Pen Inject 20 Units into the skin 2 (two) times daily. 05/10/15   Elgergawy, Leana Roe, MD  Insulin Glargine (LANTUS) 100 UNIT/ML Solostar Pen Inject 40 Units into the skin 2 (two) times daily. 02/23/19   Vanetta Mulders, MD  oxyCODONE-acetaminophen (PERCOCET/ROXICET) 5-325 MG tablet Take 1 tablet by mouth every 4 (four) hours as needed for severe pain. 02/10/16   Cheri Fowler, PA-C  polyethylene glycol [facility]) packet Take 17 g by mouth daily. 05/10/15   Elgergawy, Leana Roe, MD  rivaroxaban (XARELTO) 20 MG TABS tablet Take 20 mg by mouth daily with supper.    [provider]      Allergies    Contrast media [iodinated contrast media]    Review of Systems   Review of Systems  Constitutional:  Negative for fever.  Respiratory:  Positive for chest tightness and shortness of breath.   Cardiovascular:  Negative for chest pain, palpitations and leg swelling.  Gastrointestinal:  Negative for abdominal pain, diarrhea and vomiting.  Genitourinary:  Negative for flank pain.  Musculoskeletal:  Negative for back pain.  Skin:  Negative for rash.  Neurological:  Negative for headaches.    Physical Exam Updated Vital Signs BP (!) 155/95 (BP Location: Left Arm)   Pulse 89   Temp 97.6 F (36.4 C) (Oral)   Resp (!) 23   Ht  (2.007 m)   Wt 132.8 kg   SpO2 100%   BMI 32.98 kg/m  Physical Exam Vitals and nursing note reviewed.  Constitutional:      General: He is not in acute distress.    Appearance: Normal appearance.  HENT:     Head: Normocephalic.     Mouth/Throat:     Mouth: Mucous membranes are moist.  Cardiovascular:     Rate and Rhythm: Normal rate.  Pulmonary:     Effort: Pulmonary effort is normal. Tachypnea present. No respiratory distress.     Breath sounds: Decreased breath sounds and rales present.  Abdominal:     Palpations: Abdomen is  soft.     Tenderness: There is no abdominal tenderness.  Musculoskeletal:     Right lower leg: Edema present.     Left lower leg: Edema present.  Skin:    General: Skin is warm.  Neurological:     Mental Status: He is alert and oriented to person, place, and time. Mental status is at baseline.  Psychiatric:        Mood and Affect: Mood normal.     ED Results / Procedures / Treatments   Labs (all labs ordered are listed, but only abnormal results are displayed) Labs Reviewed  CBC WITH DIFFERENTIAL/PLATELET  COMPREHENSIVE METABOLIC PANEL  BRAIN NATRIURETIC PEPTIDE  PROTIME-INR  TROPONIN I (HIGH SENSITIVITY)    EKG None  Radiology No results  found.  Procedures Procedures    Medications Ordered in ED Medications  albuterol (VENTOLIN HFA) 108 (90 Base) MCG/ACT inhaler 2 puff (has no administration in time range)    ED Course/ Medical Decision Making/ A&P                             Medical Decision Making Amount and/or Complexity of Data Reviewed Labs: ordered. Radiology: ordered.  Risk Prescription drug management.   62 yo M with pmhx of PE which sounds unprovoked, no longer on AC, presents with SOB and orthopnea. Worsening for one week. Intermittent chest heaviness and + LE swelling.  Patient SOB on exam, diffuse rales. No hypoxia but feels better with 2L [place]. Unable to sit back in bed. Denies fever but endorses productive cough.  Blood work is normal outside elevated trop 36 and BMP 596. CXR shows diffuse edema with a focal area in the right lung. Patient appears to have new onset CHF, however need to rule out PE with right heart strain. Patient unable to lay flat currently. Will order diuresis with goal of CTPE. Patient will require admission.  Allergy lsited to contrast as nausea. Will pre treat with zofran but does not require full pre treatment. Patient signed out pending CTPE but will require admission. Stable at time of SO.        Final Clinical Impression(s) / ED Diagnoses Final diagnoses:  None    Rx / DC Orders ED Discharge Orders     None         Rozelle Logan, DO 07/04/22 1543

## 2022-07-05 ENCOUNTER — Inpatient Hospital Stay (HOSPITAL_COMMUNITY): Payer: BC Managed Care – PPO

## 2022-07-05 DIAGNOSIS — I5021 Acute systolic (congestive) heart failure: Secondary | ICD-10-CM | POA: Diagnosis not present

## 2022-07-05 DIAGNOSIS — I5041 Acute combined systolic (congestive) and diastolic (congestive) heart failure: Secondary | ICD-10-CM

## 2022-07-05 LAB — BASIC METABOLIC PANEL
Anion gap: 10 (ref 5–15)
BUN: 17 mg/dL (ref 6–20)
CO2: 25 mmol/L (ref 22–32)
Calcium: 8.2 mg/dL — ABNORMAL LOW (ref 8.9–10.3)
Chloride: 102 mmol/L (ref 98–111)
Creatinine, Ser: 1.34 mg/dL — ABNORMAL HIGH (ref 0.61–1.24)
GFR, Estimated: >60 mL/min (ref >60)
Glucose, Bld: 104 mg/dL — ABNORMAL HIGH (ref 70–99)
Potassium: 3.6 mmol/L (ref 3.5–5.1)
Sodium: 137 mmol/L (ref 135–145)

## 2022-07-05 LAB — CBC
HCT: 39.8 % (ref 39.0–52.0)
Hemoglobin: 13.4 g/dL (ref 13.0–17.0)
MCH: 31.8 pg (ref 26.0–34.0)
MCHC: 33.7 g/dL (ref 30.0–36.0)
MCV: 94.5 fL (ref 80.0–100.0)
Platelets: 174 10*3/uL (ref 150–400)
RBC: 4.21 MIL/uL — ABNORMAL LOW (ref 4.22–5.81)
RDW: 13.3 % (ref 11.5–15.5)
WBC: 8 10*3/uL (ref 4.0–10.5)
nRBC: 0 % (ref 0.0–0.2)

## 2022-07-05 LAB — ECHOCARDIOGRAM COMPLETE
Area-P 1/2: 4.8 cm2
Calc EF: 29.2 %
Height: 79 in
S' Lateral: 5.6 cm
Single Plane A2C EF: 17.8 %
Single Plane A4C EF: 32.3 %
Weight: 4507.97 oz

## 2022-07-05 LAB — HEMOGLOBIN A1C
Hgb A1c MFr Bld: 7.1 % — ABNORMAL HIGH (ref 4.8–5.6)
Mean Plasma Glucose: 157.07 mg/dL

## 2022-07-05 LAB — GLUCOSE, CAPILLARY
Glucose-Capillary: 110 mg/dL — ABNORMAL HIGH (ref 70–99)
Glucose-Capillary: 106 mg/dL — ABNORMAL HIGH (ref 70–99)
Glucose-Capillary: 136 mg/dL — ABNORMAL HIGH (ref 70–99)
Glucose-Capillary: 99 mg/dL (ref 70–99)

## 2022-07-05 LAB — HIV ANTIBODY (ROUTINE TESTING W REFLEX): HIV Screen 4th Generation wRfx: NONREACTIVE

## 2022-07-05 LAB — MAGNESIUM: Magnesium: 2.1 mg/dL (ref 1.7–2.4)

## 2022-07-05 MED ORDER — POTASSIUM CHLORIDE CRYS ER 20 MEQ PO TBCR
20.0000 meq | EXTENDED_RELEASE_TABLET | Freq: Once | ORAL | Status: AC
  Administered 2022-07-05: 20 meq via ORAL
  Filled 2022-07-05: qty 1

## 2022-07-05 MED ORDER — SACUBITRIL-VALSARTAN 24-26 MG PO TABS
1.0000 | ORAL_TABLET | Freq: Two times a day (BID) | ORAL | Status: DC
  Administered 2022-07-05 – 2022-07-08 (×7): 1 via ORAL
  Filled 2022-07-05 (×8): qty 1

## 2022-07-05 MED ORDER — METOPROLOL SUCCINATE ER 25 MG PO TB24
25.0000 mg | ORAL_TABLET | Freq: Every day | ORAL | Status: DC
  Administered 2022-07-05 – 2022-07-08 (×4): 25 mg via ORAL
  Filled 2022-07-05 (×4): qty 1

## 2022-07-05 MED ORDER — PERFLUTREN LIPID MICROSPHERE
1.0000 mL | INTRAVENOUS | Status: AC | PRN
  Administered 2022-07-05: 3 mL via INTRAVENOUS

## 2022-07-05 MED ORDER — ORAL CARE MOUTH RINSE: 15.0000 mL | OROMUCOSAL | Status: DC | PRN

## 2022-07-05 NOTE — TOC Initial Note (Addendum)
Transition of Care [facility]) - Initial/Assessment Note    Patient Details  Name: Jacob Donaldson MRN: [id number] Date of Birth: 1960-05-30  Transition of [facility] CM/SW Contact:    Howell Rucks, RN Phone Number: 07/05/2022, 10:06 AM  Clinical Narrative:  [facility] referral received for consult to Heart Failure Navigation Team-noted consult already placed, and referral for short term rehab. PT order placed, await PT recommendation. Will continue to follow.  - 1:48 per PT, pt independent , no need for acute PT eval or f/u.         Patient Goals and CMS Choice            Expected Discharge Plan and Services                                              Prior Living Arrangements/Services                       Activities of Daily Living Home Assistive Devices/Equipment: None ADL Screening (condition at time of admission) Patient's cognitive ability adequate to safely complete daily activities?: Yes Is the patient deaf or have difficulty hearing?: No Does the patient have difficulty seeing, even when wearing glasses/contacts?: No Does the patient have difficulty concentrating, remembering, or making decisions?: No Patient able to express need for assistance with ADLs?: Yes Does the patient have difficulty dressing or bathing?: No Independently performs ADLs?: Yes (appropriate for developmental age) Does the patient have difficulty walking or climbing stairs?: No Weakness of Legs: None Weakness of Arms/Hands: None  Permission Sought/Granted                  Emotional Assessment              Admission diagnosis:  Acute pulmonary edema (HCC) [J81.0] SOB (shortness of breath) [R06.02] Acute CHF (congestive heart failure) (HCC) [I50.9] Patient Active Problem List   Diagnosis Date Noted   Acute CHF (congestive heart failure) (HCC) 07/04/2022   Idiopathic acute pancreatitis    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 05/06/2015   Abdominal pain 05/06/2015    Pulmonary embolism (HCC) 11/20/2014   Type 2 diabetes mellitus (HCC) 11/20/2014   Acute renal failure syndrome (HCC)    DKA, type 2 (HCC) 10/08/2014   Obesity 10/08/2014   PCP:  Georgianne Fick, MD Pharmacy:   [facility] [phone] - [place], [place] - [address] AT [facility] [address] [place] [address] Phone: [phone] Fax: [phone]     Social Determinants of Health (SDOH) Social History: SDOH Screenings   Food Insecurity: No Food Insecurity (07/04/2022)  Housing: Low Risk  (07/04/2022)  Transportation Needs: No Transportation Needs (07/04/2022)  Utilities: Not At Risk (07/04/2022)  Tobacco Use: Low Risk  (07/04/2022)   SDOH Interventions: Housing Interventions: Intervention Not Indicated   Readmission Risk Interventions     No data to display

## 2022-07-05 NOTE — Progress Notes (Signed)
PT Cancellation Note  Patient Details Name: Encarnacion Scioneaux MRN: [id number] DOB: 04-Jul-1960   Cancelled Treatment:    Reason Eval/Treat Not Completed: PT screened, no needs identified, will sign off. Pt reports ind with mobility and RN confirms pt ambulating around room without assistance or unsteadiness. Pt politely declines PT eval, will sign off at this time.   Tori Britain Anagnos PT, DPT 07/05/22, 1:42 PM

## 2022-07-05 NOTE — Progress Notes (Signed)
[facility] PROGRESS NOTE  Jacob Donaldson (DOB: May 24, 1960) RUE:[id number] PCP: Georgianne Fick, MD  Brief Narrative: Jacob Donaldson is a 62 y.o. male with a history of T2DM, PE no longer anticoagulated who presented to the ED on 07/04/2022 with a week of worsening orthopnea, dyspnea on exertion. He was admitted for presumptive acute HFrEF, LVEF confirmed to be 30-35%, diuresing with IV lasix with cardiology consultation pending.  Subjective: Feels some better from admission but still having shortness of breath with exertion and   Objective: BP 130/87 (BP Location: Right Arm)   Pulse 88   Temp 99.2 F (37.3 C) (Oral)   Resp 20   Ht 6\' 7"  (2.007 m)   Wt 127.8 kg   SpO2 100%   BMI 31.74 kg/m   Gen: No distress Pulm: Clear when upright, no distress  CV: RRR, no MRG. Trace edema/minimal.  GI: Soft, NT, ND, +BS Neuro: Alert and oriented. No new focal deficits. Ext: Warm, no deformities. Skin: No rashes, lesions or ulcers on visualized skin   Assessment & Plan: Acute HFrEF:  - UOP good, Cr will need to be monitored, symptoms remain but slightly improved, will continue IV lasix pending cardiology formal input. Continue I/O, weights.  - Will need GDMT initiation.  - ICM evaluation per cardiology - Echo confirms newly diminished LVEF to 30-35%, global hypokinesis, appreciate cardiology recommendations.  - HF follow up 5/13 at 11am.   History of PE:  - VTE ppx with lovenox, no PE on CTA  T2DM:  - HbA1c 7.1%, continue SSI  Tyrone Nine, MD [facility] [email or web address] 07/05/2022, 3:58 PM

## 2022-07-05 NOTE — Consult Note (Addendum)
Cardiology Consultation   Patient ID: Jacob Donaldson MRN: [id number]; DOB: May 01, 1960  Admit date: 07/04/2022 Date of Consult: 07/05/2022  PCP:  Georgianne Fick, MD   [facility] Providers Cardiologist:  None  -- New    Patient Profile:   Jacob Donaldson is a 62 y.o. male with a hx of type 2 DM, history of PE no longer on AC who is being seen 07/05/2022 for the evaluation of new CHF at the request of Dr. Jarvis Newcomer  History of Present Illness:   Jacob Donaldson is a 62 year old male with above medical history. Per chart review, it does not appear that patient has any personal cardiac history. However, he reports that his wife recently passed away from heart failure.   Patient presented to the [facility] ED on 4/25 complaining of shortness of breath. Symptoms had been going on for a while, but were worse on the day of presentation. In the ED, initial vital signs showed BP 155/95, HR 89 BPM, Oxygen saturation 100% on room air. Labs showed K 4.1, creatinine 1.24, WBC 7.8, hemoglobin 13.3. hsTn 36>31. BNP elevated at 596.1. CTA chest showed no evidence of PE, but did show findings consistent with congestive heart failure, developing pulmonary edema, and small bilateral pleural effusions. Patient was admitted to [facility] on the internal medicine service. IM started IV lasix 40 mg BID.   Echocardiogram on 07/05/22 showed EF 30-35%, normal RV systolic function, mildly elevated pulmonary artery systolic pressure. Of note, there were challenging windows, but there appeared to be global hypokinesis that was worse in the anterior/anteroseptal/inferoseptal wall from base to apex. Cardiology consulted.    On interview, patient denies having any personal cardiac history.  He denies tobacco use, alcohol use.  Reports that his mother died in her 10s, possibly from a heart attack.  Otherwise no family cardiac history.  He came to the hospital because he had been having shortness of breath for the past week.   Shortness of breath was most notable when he would be laying flat on his back.  Breathing improved when he would sit upright he reports that he is not very active in his day-to-day life, but he did get some shortness of breath on exertion.  Denies chest pain.  Did have some abdominal distention and early satiety.  Denies ankle edema, palpitations.  Had a dry cough.  After being treated with IV Lasix in the hospital, his breathing has improved a bit.  He still feels like he cannot catch his breath if he is laying flat on his back.  Past Medical History:  Diagnosis Date   Acute CHF (congestive heart failure) (HCC) 07/04/2022   CKD (chronic kidney disease), stage II    Diabetes mellitus without complication (HCC)    Pulmonary embolism (HCC)     History reviewed. No pertinent surgical history.   Home Medications:  Prior to Admission medications   Medication Sig Start Date End Date Taking? Authorizing Provider  ALEVE 220 MG tablet Take 220-440 mg by mouth 2 (two) times daily as needed (for pain or headaches).   Yes [provider]  aspirin EC 81 MG tablet Take 81 mg by mouth 2 (two) times a week. Swallow whole.   Yes [provider]  cyclobenzaprine (FLEXERIL) 10 MG tablet Take 1 tablet (10 mg total) by mouth 2 (two) times daily as needed for muscle spasms. Patient not taking: Reported on 07/04/2022 02/23/19   Vanetta Mulders, MD  insulin aspart (NOVOLOG FLEXPEN)  100 UNIT/ML FlexPen Before each meal 3 times a day, 140-199 - 2 units, 200-250 - 4 units, 251-299 - 6 units,  300-349 - 8 units,  350 or above 10 units. Insulin PEN if approved, provide syringes and needles if needed. Patient not taking: Reported on 07/04/2022 10/11/14   Albertine Grates, MD  Insulin Glargine (LANTUS SOLOSTAR) 100 UNIT/ML Solostar Pen Inject 20 Units into the skin 2 (two) times daily. Patient not taking: Reported on 07/04/2022 05/10/15   Elgergawy, Leana Roe, MD  Insulin Glargine (LANTUS) 100 UNIT/ML Solostar Pen Inject  40 Units into the skin 2 (two) times daily. Patient not taking: Reported on 07/04/2022 02/23/19   Vanetta Mulders, MD  oxyCODONE-acetaminophen (PERCOCET/ROXICET) 5-325 MG tablet Take 1 tablet by mouth every 4 (four) hours as needed for severe pain. Patient not taking: Reported on 07/04/2022 02/10/16   Cheri Fowler, PA-C  polyethylene glycol [facility]) packet Take 17 g by mouth daily. Patient not taking: Reported on 07/04/2022 05/10/15   Elgergawy, Leana Roe, MD    Inpatient Medications: Scheduled Meds:  enoxaparin (LOVENOX) injection  60 mg Subcutaneous Q24H   furosemide  40 mg Intravenous Q12H   insulin aspart  0-5 Units Subcutaneous QHS   insulin aspart  0-9 Units Subcutaneous TID WC   sodium chloride flush  3 mL Intravenous Q12H   Continuous Infusions:  PRN Meds: acetaminophen **OR** acetaminophen, albuterol, ondansetron **OR** ondansetron (ZOFRAN) IV, mouth rinse, senna-docusate  Allergies:    Allergies  Allergen Reactions   Contrast Media [Iodinated Contrast Media] Nausea And Vomiting and Other (See Comments)    Vomiting after administration of IV contrast, pt states this happens "every time". He feels better afterwards.     Social History:   Social History   Socioeconomic History   Marital status: Widowed    Spouse name: Not on file   Number of children: Not on file   Years of education: Not on file   Highest education level: Not on file  Occupational History   Not on file  Tobacco Use   Smoking status: Never   Smokeless tobacco: Never  Substance and Sexual Activity   Alcohol use: No   Drug use: No   Sexual activity: Not on file  Other Topics Concern   Not on file  Social History Narrative   Not on file   Social Determinants of Health   Financial Resource Strain: Not on file  Food Insecurity: No Food Insecurity (07/04/2022)   Hunger Vital Sign    Worried About Running Out of Food in the Last Year: Never true    Ran Out of Food in the Last Year: Never true   Transportation Needs: No Transportation Needs (07/04/2022)   PRAPARE - Administrator, Civil Service (Medical): No    Lack of Transportation (Non-Medical): No  Physical Activity: Not on file  Stress: Not on file  Social Connections: Not on file  Intimate Partner Violence: Not At Risk (07/04/2022)   Humiliation, Afraid, Rape, and Kick questionnaire    Fear of Current or Ex-Partner: No    Emotionally Abused: No    Physically Abused: No    Sexually Abused: No    Family History:    Family History  Problem Relation Age of Onset   Heart disease Mother    Diabetes Mellitus II Maternal Grandmother      ROS:  Please see the history of present illness.   All other ROS reviewed and negative.     Physical Exam/Data:  Vitals:   07/04/22 2041 07/04/22 2137 07/05/22 0153 07/05/22 0513  BP:  (!) 151/106 133/82 (!) 145/97  Pulse:  94 95 81  Resp:  18 20 18   Temp:  98.7 F (37.1 C) 98.4 F (36.9 C) 98.6 F (37 C)  TempSrc:  Oral Oral Oral  SpO2:  100% 98% 97%  Weight: 129.7 kg   127.8 kg  Height: 6\' 7"  (2.007 m)       Intake/Output Summary (Last 24 hours) at 07/05/2022 1348 Last data filed at 07/05/2022 1331 Gross per 24 hour  Intake 480 ml  Output 5200 ml  Net -4720 ml      07/05/2022    5:13 AM 07/04/2022    8:41 PM 07/04/2022    1:03 PM  Last 3 Weights  Weight (lbs) 281 lb 12 oz 285 lb 15 oz 292 lb 12.3 oz  Weight (kg) 127.8 kg 129.7 kg 132.8 kg     Body mass index is 31.74 kg/m.  General:  Well nourished, well developed, in no acute distress.  Laying comfortably in the bed with head elevated, wearing nasal cannula HEENT: normal Neck: no JVD Vascular: Radial pulses 2+ bilaterally Cardiac:  normal S1, S2; RRR; no murmur  Lungs: Crackles in bilateral lung bases. normal work of breathing on 3 L supplemental oxygen via nasal cannula Abd: soft, nontender Ext: no edema in bilateral lower extremities Musculoskeletal:  No deformities, BUE and BLE strength normal  and equal Skin: warm and dry  Neuro:  CNs 2-12 intact, no focal abnormalities noted Psych:  Normal affect   EKG:  The EKG was personally reviewed and demonstrates:  Sinus rhythm, borderline left axis deviation, heart rate 90 bpm Telemetry:  Telemetry was personally reviewed and demonstrates: Normal sinus rhythm  Relevant CV Studies:  Echocardiogram 07/05/22 1. Challenging windows. global hypokinesis, appears worse in the  anterior/anteroseptal/inferoseptal wall from base to the apex. Left  ventricular ejection fraction, by estimation, is 30 to 35%. The left  ventricle has moderately decreased function. There  is mild left ventricular hypertrophy. Left ventricular diastolic  parameters are indeterminate.   2. Right ventricular systolic function is normal. The right ventricular  size is normal. There is mildly elevated pulmonary artery systolic  pressure.   3. No evidence of mitral valve regurgitation.   4. The aortic valve was not well visualized. Aortic valve regurgitation  is not visualized.   5. Aortic not well visualized.   6. The inferior vena cava is normal in size with <50% respiratory  variability, suggesting right atrial pressure of 8 mmHg.   Comparison(s): No prior Echocardiogram.    Laboratory Data:  High Sensitivity Troponin:   Recent Labs  Lab 07/04/22 1331 07/04/22 1528  TROPONINIHS 36* 31*     Chemistry Recent Labs  Lab 07/04/22 1331 07/05/22 0506  NA 137 137  K 4.1 3.6  CL 105 102  CO2 24 25  GLUCOSE 120* 104*  BUN 17 17  CREATININE 1.24 1.34*  CALCIUM 8.4* 8.2*  MG  --  2.1  GFRNONAA >60 >60  ANIONGAP 8 10    Recent Labs  Lab 07/04/22 1331  PROT 7.8  ALBUMIN 3.6  AST 36  ALT 18  ALKPHOS 76  BILITOT 0.6   Lipids No results for input(s): "CHOL", "TRIG", "HDL", "LABVLDL", "LDLCALC", "CHOLHDL" in the last 168 hours.  Hematology Recent Labs  Lab 07/04/22 1331 07/05/22 0506  WBC 7.8 8.0  RBC 4.31 4.21*  HGB 13.3 13.4  HCT 40.9 39.8  MCV 94.9 94.5  MCH 30.9 31.8  MCHC 32.5 33.7  RDW 13.3 13.3  PLT 184 174   Thyroid No results for input(s): "TSH", "FREET4" in the last 168 hours.  BNP Recent Labs  Lab 07/04/22 1331  BNP 596.1*    DDimer No results for input(s): "DDIMER" in the last 168 hours.   Radiology/Studies:  ECHOCARDIOGRAM COMPLETE  Result Date: 07/05/2022    ECHOCARDIOGRAM REPORT   Patient Name:   Jacob Donaldson Date of Exam: 07/05/2022 Medical Rec #:  [id number]     Height:       79.0 in Accession #:    [id number]    Weight:       281.7 lb Date of Birth:  January 16, 1961     BSA:          2.636 m Patient Age:    61 years      BP:           145/97 mmHg Patient Gender: M             HR:           82 bpm. Exam Location:  Inpatient Procedure: 2D Echo, Cardiac Doppler, Color Doppler and Intracardiac            Opacification Agent Indications:    CHF  History:        Patient has no prior history of Echocardiogram examinations.                 Signs/Symptoms:Dyspnea; Risk Factors:Diabetes.  Sonographer:    Milbert Coulter Referring Phys: [id number] VISHAL R PATEL IMPRESSIONS  1. Challenging windows. global hypokinesis, appears worse in the anterior/anteroseptal/inferoseptal wall from base to the apex. Left ventricular ejection fraction, by estimation, is 30 to 35%. The left ventricle has moderately decreased function. There is mild left ventricular hypertrophy. Left ventricular diastolic parameters are indeterminate.  2. Right ventricular systolic function is normal. The right ventricular size is normal. There is mildly elevated pulmonary artery systolic pressure.  3. No evidence of mitral valve regurgitation.  4. The aortic valve was not well visualized. Aortic valve regurgitation is not visualized.  5. Aortic not well visualized.  6. The inferior vena cava is normal in size with <50% respiratory variability, suggesting right atrial pressure of 8 mmHg. Comparison(s): No prior Echocardiogram. FINDINGS  Left Ventricle: Challenging windows.  global hypokinesis, appears worse in the anterior/anteroseptal/inferoseptal wall from base to the apex. Left ventricular ejection fraction, by estimation, is 30 to 35%. The left ventricle has moderately decreased function. Definity contrast agent was given IV to delineate the left ventricular endocardial borders. The left ventricular internal cavity size was normal in size. There is mild left ventricular hypertrophy. Left ventricular diastolic parameters are indeterminate. Right Ventricle: The right ventricular size is normal. Right ventricular systolic function is normal. There is mildly elevated pulmonary artery systolic pressure. The tricuspid regurgitant velocity is 2.89 m/s, and with an assumed right atrial pressure of 8 mmHg, the estimated right ventricular systolic pressure is 41.4 mmHg. Left Atrium: Left atrial size was normal in size. Right Atrium: Right atrial size was normal in size. Pericardium: There is no evidence of pericardial effusion. Mitral Valve: No evidence of mitral valve regurgitation. Tricuspid Valve: Tricuspid valve regurgitation is mild. Aortic Valve: The aortic valve was not well visualized. Aortic valve regurgitation is not visualized. Pulmonic Valve: Pulmonic valve regurgitation is not visualized. Aorta: Not well visualized. Venous: The inferior vena cava is normal in size with less than 50% respiratory  variability, suggesting right atrial pressure of 8 mmHg. IAS/Shunts: The interatrial septum was not well visualized.  LEFT VENTRICLE PLAX 2D LVIDd:         6.30 cm      Diastology LVIDs:         5.60 cm      LV e' medial:    6.20 cm/s LV PW:         1.40 cm      LV E/e' medial:  14.6 LV IVS:        1.30 cm      LV e' lateral:   10.10 cm/s LVOT diam:     2.80 cm      LV E/e' lateral: 9.0 LVOT Area:     6.16 cm  LV Volumes (MOD) LV vol d, MOD A2C: 103.0 ml LV vol d, MOD A4C: 167.0 ml LV vol s, MOD A2C: 84.7 ml LV vol s, MOD A4C: 113.0 ml LV SV MOD A2C:     18.3 ml LV SV MOD A4C:     167.0  ml LV SV MOD BP:      42.2 ml RIGHT VENTRICLE RV Basal diam:  2.80 cm RV Mid diam:    1.90 cm RV S prime:     11.10 cm/s TAPSE (M-mode): 2.2 cm LEFT ATRIUM              Index        RIGHT ATRIUM           Index LA Vol (A2C):   113.0 ml 42.86 ml/m  RA Area:     22.60 cm LA Vol (A4C):   84.3 ml  31.98 ml/m  RA Volume:   69.00 ml  26.17 ml/m LA Biplane Vol: 101.0 ml 38.31 ml/m  MITRAL VALVE               TRICUSPID VALVE MV Area (PHT): 4.80 cm    TR Peak grad:   33.4 mmHg MV Decel Time: 158 msec    TR Vmax:        289.00 cm/s MV E velocity: 90.40 cm/s MV A velocity: 29.10 cm/s  SHUNTS MV E/A ratio:  3.11        Systemic Diam: 2.80 cm Carolan Clines Electronically signed by Carolan Clines Signature Date/Time: 07/05/2022/10:22:30 AM    Final    CT Angio Chest PE W/Cm &/Or Wo Cm  Result Date: 07/04/2022 CLINICAL DATA:  Short of breath for 1 week, positive D-dimer EXAM: CT ANGIOGRAPHY CHEST WITH CONTRAST TECHNIQUE: Multidetector CT imaging of the chest was performed using the standard protocol during bolus administration of intravenous contrast. Multiplanar CT image reconstructions and MIPs were obtained to evaluate the vascular anatomy. RADIATION DOSE REDUCTION: This exam was performed according to the departmental dose-optimization program which includes automated exposure control, adjustment of the mA and/or kV according to patient size and/or use of iterative reconstruction technique. CONTRAST:  OMNIPAQUE IOHEXOL 350 MG/ML SOLN COMPARISON:  07/04/2022, 02/23/2019 FINDINGS: Cardiovascular: This is a technically adequate evaluation of the pulmonary vasculature. There are no filling defects or pulmonary emboli. The heart is enlarged, with trace pericardial fluid unchanged. Normal caliber of the thoracic aorta. Evaluation of the aortic lumen is limited by timing of the contrast bolus. Mediastinum/Nodes: There is mediastinal and bilateral hilar lymphadenopathy, largest lymph node in the right paratracheal region  measuring 15 mm in short axis. Thyroid, trachea, and esophagus are grossly unremarkable. Lungs/Pleura: There are small bilateral pleural effusions, with pleural fluid  loculated in the right major and minor fissures likely accounting for the chest x-ray densities. There is central vascular congestion with diffuse interlobular septal thickening consistent with developing interstitial edema. No pneumothorax. Central airways are patent. Upper Abdomen: No acute abnormality. Musculoskeletal: There are no acute or destructive bony lesions. Reconstructed images demonstrate no additional findings. Review of the MIP images confirms the above findings. IMPRESSION: 1. No evidence of pulmonary embolus. 2. Findings most consistent with congestive heart failure and developing pulmonary edema. 3. Small bilateral pleural effusions, with loculated fluid in the right major and minor fissures likely accounting for the chest x-ray density. 4. Mediastinal and hilar lymphadenopathy, nonspecific but likely reactive. Electronically Signed   By: Sharlet Salina M.D.   On: 07/04/2022 15:29   DG Chest Port 1 View  Result Date: 07/04/2022 CLINICAL DATA:  Shortness of breath. EXAM: PORTABLE CHEST 1 VIEW COMPARISON:  February 23, 2019 FINDINGS: Normal for AP technique cardiac silhouette. No evidence of pneumothorax or pleural effusion. Hazy bilateral airspace opacities with lower lobe predominance. More focal airspace opacity in the right mid lung field. Osseous structures are without acute abnormality. Soft tissues are grossly normal. IMPRESSION: 1. Hazy bilateral airspace opacities with lower lobe predominance. 2. More focal airspace opacity in the right mid lung field. This may represent focal airspace consolidation, pulmonary mass or potentially fluid within the major fissure. Consider further evaluation with chest CT. Electronically Signed   By: Ted Mcalpine M.D.   On: 07/04/2022 13:34     Assessment and Plan:   Acute systolic  heart failure  - Echocardiogram this admission showed EF 30-35%, global hypokinesis that was worse in theanterior/anteroseptal/inferoseptal wall from base to the apex  - BNP elevated to 596. CTA chest with findings consistent with CHF and developing pulmonary edema, small bilateral pleural effusions  - He was started on IV lasix 40 mg BID by internal medicine- put out 3.45 L urine yesterday and 1.75 L urine so far today - Patient continues to have orthopnea, shortness of breath.  Continue IV Lasix for now with strict I/os, daily weights. - Unclear cause of cardiomyopathy.  Patient denies alcohol or drug use.  Does not have family history of cardiac issues.  No tachycardia or arrhythmias noted on telemetry. Possible stress induced following recent death of his wife. Could also be ischemic  - Consider coronary CT when patient more euvolemic.  Given lack of chest pain symptoms, may be able to do as an outpatient - Ordered lipid panel to assess CAD risk.  - Start GDMT with Toprol-XL 25 mg daily, entresto 24-26 mg BID  - Plan to add SGLT2i prior to DC  - HF nurse navigator referred patient to HF pharmacist stewardship, HF social worker as patient does not currently have insurance.     Risk Assessment/Risk Scores:   New York Heart Association (NYHA) Functional Class NYHA Class IV     For questions or updates, please contact [facility] Please consult [email or web address] for contact info under    Signed, Jonita Albee, PA-C  07/05/2022 1:48 PM  I have personally seen and examined this patient. I agree with the assessment and plan as outlined above.  62 yo male with history of DM and prior PE who is admitted with dyspnea and found to have evidence of CHF with pulmonary edema. No known cardiac issues prior to this admission. He has been started on IV Lasix and is diuresing well. Echo with LVEF 30-35% with global HK but cannot exclude  segmental wall motion abnormalities. Labs reviewed  by me. EKG reviewed by me and shows sinus with poor R wave progression My exam: Gen: NAD CV:RRR without murmur Lungs: clear bilaterally Abd: soft, NT Ext: no LE edema  Plan: Acute systolic CHF: New finding of CHF and LV dysfunction. He is diuresing well with symptomatic improvement in his dyspnea. He still has orthopnea. Renal function stable. Continue diuresis with IV Lasix. Agree that we can exclude CAD with a coronary CTA either next week or as an outpatient. Will add Toprol and Entresto. We will follow this weekend.   Verne Carrow, MD, [facility] 07/05/2022 2:51 PM

## 2022-07-05 NOTE — Progress Notes (Signed)
Heart Failure Nurse Navigator Progress Note  PCP: Georgianne Fick, MD PCP-Cardiologist: None Admission Diagnosis: None Admitted from: Home  Presentation:   Jacob Donaldson presented with shortness of breath, feels like cannot take a deep breath, BP 155/95, HR 89, BMI 32.95, BNP 596, BLE edema, patient no longer on anticoagulation. CXR shows diffuse edema.with a focal area in right lung.  Patient to have a PT eval for possible rehab at this time.   Will bring patient to HF [facility] for follow up appointment on 07/22/2022 @ 11 am. Tried reaching patient on room number and personal phone with no answer. Will complete education and SDOH during appointment.   ECHO/ LVEF: 30% new  Clinical Course:  Past Medical History:  Diagnosis Date   Acute CHF (congestive heart failure) (HCC) 07/04/2022   CKD (chronic kidney disease), stage II    Diabetes mellitus without complication (HCC)    Pulmonary embolism (HCC)      Social History   Socioeconomic History   Marital status: Widowed    Spouse name: Not on file   Number of children: Not on file   Years of education: Not on file   Highest education level: Not on file  Occupational History   Not on file  Tobacco Use   Smoking status: Never   Smokeless tobacco: Never  Substance and Sexual Activity   Alcohol use: No   Drug use: No   Sexual activity: Not on file  Other Topics Concern   Not on file  Social History Narrative   Not on file   Social Determinants of Health   Financial Resource Strain: Not on file  Food Insecurity: No Food Insecurity (07/04/2022)   Hunger Vital Sign    Worried About Running Out of Food in the Last Year: Never true    Ran Out of Food in the Last Year: Never true  Transportation Needs: No Transportation Needs (07/04/2022)   PRAPARE - Administrator, Civil Service (Medical): No    Lack of Transportation (Non-Medical): No  Physical Activity: Not on file  Stress: Not on file  Social Connections: Not on  file    High Risk Criteria for Readmission and/or Poor Patient Outcomes: Heart failure hospital admissions (last 6 months): 0  No Show rate: 11 %  Difficult social situation:  Demonstrates medication adherence:  Primary Language: English Literacy level:   Barriers of Care:   New HF Was unable to interview by phone, no answer at [facility] cell  Considerations/Referrals:   Referral made to Heart Failure Pharmacist Stewardship: Yes, NO INsurance Referral made to Heart Failure CSW/NCM TOC: Yes, help with Insurance and costs Referral made to Heart & Vascular TOC clinic: yes, 07/22/2022 @ 11 am.   Items for Follow-up on DC/TOC: HF education SDOH completion Medication costs ( no Insurance)   Rhae Hammock, Scientist, research (physical sciences), Scientist, clinical (histocompatibility and immunogenetics) Only

## 2022-07-06 DIAGNOSIS — J810 Acute pulmonary edema: Principal | ICD-10-CM

## 2022-07-06 DIAGNOSIS — I5041 Acute combined systolic (congestive) and diastolic (congestive) heart failure: Secondary | ICD-10-CM

## 2022-07-06 DIAGNOSIS — I5021 Acute systolic (congestive) heart failure: Secondary | ICD-10-CM | POA: Diagnosis not present

## 2022-07-06 LAB — GLUCOSE, CAPILLARY
Glucose-Capillary: 125 mg/dL — ABNORMAL HIGH (ref 70–99)
Glucose-Capillary: 128 mg/dL — ABNORMAL HIGH (ref 70–99)
Glucose-Capillary: 152 mg/dL — ABNORMAL HIGH (ref 70–99)
Glucose-Capillary: 121 mg/dL — ABNORMAL HIGH (ref 70–99)

## 2022-07-06 LAB — LIPID PANEL
Cholesterol: 139 mg/dL (ref 0–200)
HDL: 25 mg/dL — ABNORMAL LOW (ref >40)
LDL Cholesterol: 97 mg/dL (ref 0–99)
Total CHOL/HDL Ratio: 5.6 RATIO
Triglycerides: 87 mg/dL (ref <150)
VLDL: 17 mg/dL (ref 0–40)

## 2022-07-06 LAB — BASIC METABOLIC PANEL
Anion gap: 12 (ref 5–15)
BUN: 21 mg/dL — ABNORMAL HIGH (ref 6–20)
CO2: 22 mmol/L (ref 22–32)
Calcium: 8.5 mg/dL — ABNORMAL LOW (ref 8.9–10.3)
Chloride: 101 mmol/L (ref 98–111)
Creatinine, Ser: 1.32 mg/dL — ABNORMAL HIGH (ref 0.61–1.24)
GFR, Estimated: >60 mL/min (ref >60)
Glucose, Bld: 116 mg/dL — ABNORMAL HIGH (ref 70–99)
Potassium: 3.9 mmol/L (ref 3.5–5.1)
Sodium: 135 mmol/L (ref 135–145)

## 2022-07-06 MED ORDER — PREDNISONE 50 MG PO TABS
50.0000 mg | ORAL_TABLET | Freq: Four times a day (QID) | ORAL | Status: AC
  Administered 2022-07-06 – 2022-07-07 (×3): 50 mg via ORAL
  Filled 2022-07-06 (×3): qty 1

## 2022-07-06 MED ORDER — DIPHENHYDRAMINE HCL 25 MG PO CAPS: 50.0000 mg | ORAL_CAPSULE | Freq: Once | ORAL | Status: DC

## 2022-07-06 MED ORDER — DIPHENHYDRAMINE HCL 50 MG/ML IJ SOLN: 50.0000 mg | Freq: Once | INTRAMUSCULAR | Status: DC

## 2022-07-06 MED ORDER — METOPROLOL TARTRATE 50 MG PO TABS
50.0000 mg | ORAL_TABLET | Freq: Once | ORAL | Status: AC
  Administered 2022-07-08: 50 mg via ORAL
  Filled 2022-07-06: qty 1

## 2022-07-06 NOTE — Progress Notes (Signed)
[facility] PROGRESS NOTE  Jacob Donaldson (DOB: 06-12-1960) OZH:[id number] PCP: Georgianne Fick, MD  Brief Narrative: Jacob Donaldson is a 62 y.o. male with a history of T2DM, PE no longer anticoagulated who presented to the ED on 07/04/2022 with a week of worsening orthopnea, dyspnea on exertion. He was admitted for presumptive acute HFrEF, LVEF confirmed to be 30-35%. GDMT has been initiated and IV diuresis is ongoing with cardiology consultation.   Subjective: Again with some improvement from yesterday, still can't lay far back due to orthopnea which at times manifests as wheezing sounds and shortness of breath. No chest pain. Urinating briskly.   Objective: BP (!) 139/95   Pulse 72   Temp 97.7 F (36.5 C) (Oral)   Resp 18   Ht 6\' 7"  (2.007 m)   Wt 126.4 kg   SpO2 100%   BMI 31.39 kg/m   Gen: No distress, pleasant Pulm: Crackles scant at bases, but improved aeration, no upper airway sounds. Nonlabored with supplemental oxygen  CV: RRR, no MRG, no significant edema or JVD upright. GI: Soft, NT, ND, +BS  Neuro: Alert and oriented. No new focal deficits. Ext: Warm, no deformities. Skin: No rashes, lesions or ulcers on visualized skin   Assessment & Plan: Acute HFrEF:  - Continue lasix 40mg  IV BID for now. 5L UOP yesterday, weight down 129 > 127 > 126kg, still with scant crackles but may be approaching dry weight. Cr stable at 1.32, BUN 21, bicarb 22. - Initiated metoprolol succinate and entresto 4/26. VSS, has BP room for titration. With HbA1c 7.1%, would benefit from SGLT2i.  - ICM evaluation per cardiology - HF follow up 5/13 at 11am.   History of PE:  - VTE ppx with lovenox 0.5mg /kg q24h, no PE on CTA  T2DM:  - HbA1c 7.1%, continue SSI  Tyrone Nine, MD [facility] [email or web address] 07/06/2022, 9:41 AM

## 2022-07-06 NOTE — Progress Notes (Signed)
Cardiac CT ordered for Monday.  Will hold on appropriate PIV for test until Monday am.

## 2022-07-06 NOTE — Progress Notes (Signed)
Rounding Note    Patient Name: Jacob Donaldson Date of Encounter: 07/06/2022  [facility] Cardiologist: None   Subjective   Better.  Still little orthopneic.  Has not been out of bed yet. No chest pain. No arrhythmia on telemetry. Net diuresis 6 L since admission.  Weight down 6.4 kg. So far tolerating metoprolol and Entresto without side effects. Reports "allergy" to contrast media was actually nausea and vomiting.  No rash/pruritus/wheezing/anaphylaxis.  Inpatient Medications    Scheduled Meds:  enoxaparin (LOVENOX) injection  60 mg Subcutaneous Q24H   furosemide  40 mg Intravenous Q12H   insulin aspart  0-5 Units Subcutaneous QHS   insulin aspart  0-9 Units Subcutaneous TID WC   metoprolol succinate  25 mg Oral Daily   sacubitril-valsartan  1 tablet Oral BID   sodium chloride flush  3 mL Intravenous Q12H   Continuous Infusions:  PRN Meds: acetaminophen **OR** acetaminophen, albuterol, ondansetron **OR** ondansetron (ZOFRAN) IV, mouth rinse, senna-docusate   Vital Signs    Vitals:   07/05/22 1622 07/05/22 2031 07/06/22 0617 07/06/22 0921  BP: 128/86 (!) 143/98 (!) 129/92 (!) 139/95  Pulse: 72 78 68 72  Resp: 20 20 18    Temp: 98.8 F (37.1 C) 98.9 F (37.2 C) 97.7 F (36.5 C)   TempSrc: Oral Oral Oral   SpO2: 99% 100% 97% 100%  Weight:   126.4 kg   Height:        Intake/Output Summary (Last 24 hours) at 07/06/2022 0943 Last data filed at 07/06/2022 0981 Gross per 24 hour  Intake 840 ml  Output 3995 ml  Net -3155 ml      07/06/2022    6:17 AM 07/05/2022    5:13 AM 07/04/2022    8:41 PM  Last 3 Weights  Weight (lbs) 278 lb 10.6 oz 281 lb 12 oz 285 lb 15 oz  Weight (kg) 126.4 kg 127.8 kg 129.7 kg      Telemetry    Sinus rhythm- Personally Reviewed  ECG    Sinus rhythm, early RS transition in V2 (lead placement?), otherwise normal tracing.  There are no Q waves or repolarization abnormalities - Personally Reviewed  Physical Exam   Obese GEN: No acute distress.   Neck: No JVD Cardiac: RRR, no murmurs, rubs, or gallops.  Respiratory: Clear to auscultation bilaterally. GI: Soft, nontender, non-distended  MS: No edema; No deformity. Neuro:  Nonfocal  Psych: Normal affect   Labs    High Sensitivity Troponin:   Recent Labs  Lab 07/04/22 1331 07/04/22 1528  TROPONINIHS 36* 31*     Chemistry Recent Labs  Lab 07/04/22 1331 07/05/22 0506 07/06/22 0605  NA 137 137 135  K 4.1 3.6 3.9  CL 105 102 101  CO2 24 25 22   GLUCOSE 120* 104* 116*  BUN 17 17 21*  CREATININE 1.24 1.34* 1.32*  CALCIUM 8.4* 8.2* 8.5*  MG  --  2.1  --   PROT 7.8  --   --   ALBUMIN 3.6  --   --   AST 36  --   --   ALT 18  --   --   ALKPHOS 76  --   --   BILITOT 0.6  --   --   GFRNONAA >60 >60 >60  ANIONGAP 8 10 12     Lipids  Recent Labs  Lab 07/06/22 0605  CHOL 139  TRIG 87  HDL 25*  LDLCALC 97  CHOLHDL 5.6    Hematology Recent  Labs  Lab 07/04/22 1331 07/05/22 0506  WBC 7.8 8.0  RBC 4.31 4.21*  HGB 13.3 13.4  HCT 40.9 39.8  MCV 94.9 94.5  MCH 30.9 31.8  MCHC 32.5 33.7  RDW 13.3 13.3  PLT 184 174   Thyroid No results for input(s): "TSH", "FREET4" in the last 168 hours.  BNP Recent Labs  Lab 07/04/22 1331  BNP 596.1*    DDimer No results for input(s): "DDIMER" in the last 168 hours.   Radiology    ECHOCARDIOGRAM COMPLETE  Result Date: 07/05/2022    ECHOCARDIOGRAM REPORT   Patient Name:   Jacob Donaldson Date of Exam: 07/05/2022 Medical Rec #:  [id number]     Height:       79.0 in Accession #:    [id number]    Weight:       281.7 lb Date of Birth:  02/19/1961     BSA:          2.636 m Patient Age:    61 years      BP:           145/97 mmHg Patient Gender: M             HR:           82 bpm. Exam Location:  Inpatient Procedure: 2D Echo, Cardiac Doppler, Color Doppler and Intracardiac            Opacification Agent Indications:    CHF  History:        Patient has no prior history of Echocardiogram examinations.                  Signs/Symptoms:Dyspnea; Risk Factors:Diabetes.  Sonographer:    Milbert Coulter Referring Phys: [id number] VISHAL R PATEL IMPRESSIONS  1. Challenging windows. global hypokinesis, appears worse in the anterior/anteroseptal/inferoseptal wall from base to the apex. Left ventricular ejection fraction, by estimation, is 30 to 35%. The left ventricle has moderately decreased function. There is mild left ventricular hypertrophy. Left ventricular diastolic parameters are indeterminate.  2. Right ventricular systolic function is normal. The right ventricular size is normal. There is mildly elevated pulmonary artery systolic pressure.  3. No evidence of mitral valve regurgitation.  4. The aortic valve was not well visualized. Aortic valve regurgitation is not visualized.  5. Aortic not well visualized.  6. The inferior vena cava is normal in size with <50% respiratory variability, suggesting right atrial pressure of 8 mmHg. Comparison(s): No prior Echocardiogram. FINDINGS  Left Ventricle: Challenging windows. global hypokinesis, appears worse in the anterior/anteroseptal/inferoseptal wall from base to the apex. Left ventricular ejection fraction, by estimation, is 30 to 35%. The left ventricle has moderately decreased function. Definity contrast agent was given IV to delineate the left ventricular endocardial borders. The left ventricular internal cavity size was normal in size. There is mild left ventricular hypertrophy. Left ventricular diastolic parameters are indeterminate. Right Ventricle: The right ventricular size is normal. Right ventricular systolic function is normal. There is mildly elevated pulmonary artery systolic pressure. The tricuspid regurgitant velocity is 2.89 m/s, and with an assumed right atrial pressure of 8 mmHg, the estimated right ventricular systolic pressure is 41.4 mmHg. Left Atrium: Left atrial size was normal in size. Right Atrium: Right atrial size was normal in size. Pericardium: There  is no evidence of pericardial effusion. Mitral Valve: No evidence of mitral valve regurgitation. Tricuspid Valve: Tricuspid valve regurgitation is mild. Aortic Valve: The aortic valve was not well visualized. Aortic valve regurgitation is not  visualized. Pulmonic Valve: Pulmonic valve regurgitation is not visualized. Aorta: Not well visualized. Venous: The inferior vena cava is normal in size with less than 50% respiratory variability, suggesting right atrial pressure of 8 mmHg. IAS/Shunts: The interatrial septum was not well visualized.  LEFT VENTRICLE PLAX 2D LVIDd:         6.30 cm      Diastology LVIDs:         5.60 cm      LV e' medial:    6.20 cm/s LV PW:         1.40 cm      LV E/e' medial:  14.6 LV IVS:        1.30 cm      LV e' lateral:   10.10 cm/s LVOT diam:     2.80 cm      LV E/e' lateral: 9.0 LVOT Area:     6.16 cm  LV Volumes (MOD) LV vol d, MOD A2C: 103.0 ml LV vol d, MOD A4C: 167.0 ml LV vol s, MOD A2C: 84.7 ml LV vol s, MOD A4C: 113.0 ml LV SV MOD A2C:     18.3 ml LV SV MOD A4C:     167.0 ml LV SV MOD BP:      42.2 ml RIGHT VENTRICLE RV Basal diam:  2.80 cm RV Mid diam:    1.90 cm RV S prime:     11.10 cm/s TAPSE (M-mode): 2.2 cm LEFT ATRIUM              Index        RIGHT ATRIUM           Index LA Vol (A2C):   113.0 ml 42.86 ml/m  RA Area:     22.60 cm LA Vol (A4C):   84.3 ml  31.98 ml/m  RA Volume:   69.00 ml  26.17 ml/m LA Biplane Vol: 101.0 ml 38.31 ml/m  MITRAL VALVE               TRICUSPID VALVE MV Area (PHT): 4.80 cm    TR Peak grad:   33.4 mmHg MV Decel Time: 158 msec    TR Vmax:        289.00 cm/s MV E velocity: 90.40 cm/s MV A velocity: 29.10 cm/s  SHUNTS MV E/A ratio:  3.11        Systemic Diam: 2.80 cm Carolan Clines Electronically signed by Carolan Clines Signature Date/Time: 07/05/2022/10:22:30 AM    Final    CT Angio Chest PE W/Cm &/Or Wo Cm  Result Date: 07/04/2022 CLINICAL DATA:  Short of breath for 1 week, positive D-dimer EXAM: CT ANGIOGRAPHY CHEST WITH CONTRAST TECHNIQUE:  Multidetector CT imaging of the chest was performed using the standard protocol during bolus administration of intravenous contrast. Multiplanar CT image reconstructions and MIPs were obtained to evaluate the vascular anatomy. RADIATION DOSE REDUCTION: This exam was performed according to the departmental dose-optimization program which includes automated exposure control, adjustment of the mA and/or kV according to patient size and/or use of iterative reconstruction technique. CONTRAST:  OMNIPAQUE IOHEXOL 350 MG/ML SOLN COMPARISON:  07/04/2022, 02/23/2019 FINDINGS: Cardiovascular: This is a technically adequate evaluation of the pulmonary vasculature. There are no filling defects or pulmonary emboli. The heart is enlarged, with trace pericardial fluid unchanged. Normal caliber of the thoracic aorta. Evaluation of the aortic lumen is limited by timing of the contrast bolus. Mediastinum/Nodes: There is mediastinal and bilateral hilar lymphadenopathy, largest lymph node in the  right paratracheal region measuring 15 mm in short axis. Thyroid, trachea, and esophagus are grossly unremarkable. Lungs/Pleura: There are small bilateral pleural effusions, with pleural fluid loculated in the right major and minor fissures likely accounting for the chest x-ray densities. There is central vascular congestion with diffuse interlobular septal thickening consistent with developing interstitial edema. No pneumothorax. Central airways are patent. Upper Abdomen: No acute abnormality. Musculoskeletal: There are no acute or destructive bony lesions. Reconstructed images demonstrate no additional findings. Review of the MIP images confirms the above findings. IMPRESSION: 1. No evidence of pulmonary embolus. 2. Findings most consistent with congestive heart failure and developing pulmonary edema. 3. Small bilateral pleural effusions, with loculated fluid in the right major and minor fissures likely accounting for the chest x-ray  density. 4. Mediastinal and hilar lymphadenopathy, nonspecific but likely reactive. Electronically Signed   By: Sharlet Salina M.D.   On: 07/04/2022 15:29   DG Chest Port 1 View  Result Date: 07/04/2022 CLINICAL DATA:  Shortness of breath. EXAM: PORTABLE CHEST 1 VIEW COMPARISON:  February 23, 2019 FINDINGS: Normal for AP technique cardiac silhouette. No evidence of pneumothorax or pleural effusion. Hazy bilateral airspace opacities with lower lobe predominance. More focal airspace opacity in the right mid lung field. Osseous structures are without acute abnormality. Soft tissues are grossly normal. IMPRESSION: 1. Hazy bilateral airspace opacities with lower lobe predominance. 2. More focal airspace opacity in the right mid lung field. This may represent focal airspace consolidation, pulmonary mass or potentially fluid within the major fissure. Consider further evaluation with chest CT. Electronically Signed   By: Ted Mcalpine M.D.   On: 07/04/2022 13:34    Cardiac Studies   Echocardiogram 07/05/2022  1. Challenging windows. global hypokinesis, appears worse in the anterior/anteroseptal/inferoseptal wall from base to the apex. Left ventricular ejection fraction, by estimation, is 30 to 35%. The left ventricle has moderately decreased function. There is mild left ventricular hypertrophy. Left ventricular diastolic parameters are indeterminate.   2. Right ventricular systolic function is normal. The right ventricular size is normal. There is mildly elevated pulmonary artery systolic pressure.   3. No evidence of mitral valve regurgitation.   4. The aortic valve was not well visualized. Aortic valve regurgitation is not visualized.  5. Aortic not well visualized.   6. The inferior vena cava is normal in size with <50% respiratory variability, suggesting right atrial pressure of 8 mmHg.  Comparison(s): No prior Echocardiogram.  Patient Profile     62 y.o. male with type 2 diabetes mellitus and  remote history of pulmonary embolism presents with acute pulmonary edema and newly diagnosed combined systolic and diastolic heart failure.  Assessment & Plan    Rapid symptomatic improvement with diuresis.  Slight upward tick in renal parameters, but still within the average renal function parameters she has had over the last 8 years (creatinine typically 1.3-1.4). Tolerating Entresto and metoprolol. Does not have angina or ECG changes suggestive of coronary insufficiency, but will require evaluation for CAD as a cause of his cardiomyopathy.  Plan coronary CT angiogram.  Will try to see if we can get this done over the weekend. He is not truly allergic to iodinated contrast, but develops nausea and vomiting. He only reports symptoms just for the last 3 weeks, but the presence of a left ventricular dilation suggest this is a more chronic abnormality. Discussed sodium restricted diet, daily weight monitoring, signs and symptoms of heart failure in detail.  He is familiar with a lot of these  recommendations due to his wife's long history of heart failure/heart transplantation. Will probably switch to oral diuretics tomorrow.     For questions or updates, please contact [facility] Please consult [email or web address] for contact info under        Signed, Thurmon Fair, MD  07/06/2022, 9:43 AM

## 2022-07-07 DIAGNOSIS — J810 Acute pulmonary edema: Secondary | ICD-10-CM | POA: Diagnosis not present

## 2022-07-07 DIAGNOSIS — I5041 Acute combined systolic (congestive) and diastolic (congestive) heart failure: Secondary | ICD-10-CM | POA: Diagnosis not present

## 2022-07-07 LAB — GLUCOSE, CAPILLARY
Glucose-Capillary: 298 mg/dL — ABNORMAL HIGH (ref 70–99)
Glucose-Capillary: 251 mg/dL — ABNORMAL HIGH (ref 70–99)
Glucose-Capillary: 197 mg/dL — ABNORMAL HIGH (ref 70–99)
Glucose-Capillary: 300 mg/dL — ABNORMAL HIGH (ref 70–99)

## 2022-07-07 LAB — BASIC METABOLIC PANEL
Anion gap: 13 (ref 5–15)
BUN: 23 mg/dL — ABNORMAL HIGH (ref 6–20)
CO2: 24 mmol/L (ref 22–32)
Calcium: 8.6 mg/dL — ABNORMAL LOW (ref 8.9–10.3)
Chloride: 95 mmol/L — ABNORMAL LOW (ref 98–111)
Creatinine, Ser: 1.64 mg/dL — ABNORMAL HIGH (ref 0.61–1.24)
GFR, Estimated: 48 mL/min — ABNORMAL LOW (ref >60)
Glucose, Bld: 193 mg/dL — ABNORMAL HIGH (ref 70–99)
Potassium: 4.7 mmol/L (ref 3.5–5.1)
Sodium: 132 mmol/L — ABNORMAL LOW (ref 135–145)

## 2022-07-07 NOTE — Progress Notes (Signed)
Rounding Note    Patient Name: Jacob Donaldson Date of Encounter: 07/07/2022  [facility] Cardiologist: None   Subjective   Feels well. No longer orthopneic. Continues have excellent diuresis, over 4 L last 24 hours.  Total net 9.5 L fluid loss since admission. Creatinine with a significant increase to 1.64.  Will hold diuretics in anticipation of coronary CT angiogram tomorrow.  Inpatient Medications    Scheduled Meds:  diphenhydrAMINE  50 mg Oral Once   Or   diphenhydrAMINE  50 mg Intravenous Once   enoxaparin (LOVENOX) injection  60 mg Subcutaneous Q24H   insulin aspart  0-5 Units Subcutaneous QHS   insulin aspart  0-9 Units Subcutaneous TID WC   metoprolol succinate  25 mg Oral Daily   [START ON 07/08/2022] metoprolol tartrate  50 mg Oral Once   predniSONE  50 mg Oral Q6H   sacubitril-valsartan  1 tablet Oral BID   sodium chloride flush  3 mL Intravenous Q12H   Continuous Infusions:  PRN Meds: acetaminophen **OR** acetaminophen, albuterol, ondansetron **OR** ondansetron (ZOFRAN) IV, mouth rinse, senna-docusate   Vital Signs    Vitals:   07/06/22 2158 07/07/22 0459 07/07/22 0502 07/07/22 0505  BP: 126/89 (!) 136/100 (!) 139/97   Pulse: 82 75 73   Resp: 19     Temp: 98.9 F (37.2 C) (!) 97.5 F (36.4 C)    TempSrc: Oral Oral    SpO2: 100% 100%    Weight:    125.6 kg  Height:        Intake/Output Summary (Last 24 hours) at 07/07/2022 0915 Last data filed at 07/07/2022 0901 Gross per 24 hour  Intake 960 ml  Output 4000 ml  Net -3040 ml      07/07/2022    5:05 AM 07/06/2022    6:17 AM 07/05/2022    5:13 AM  Last 3 Weights  Weight (lbs) 276 lb 14.4 oz 278 lb 10.6 oz 281 lb 12 oz  Weight (kg) 125.6 kg 126.4 kg 127.8 kg      Telemetry    NSR - Personally Reviewed  ECG    No new tracing- Personally Reviewed  Physical Exam  Obese GEN: No acute distress.   Neck: No JVD Cardiac: RRR, no murmurs, rubs, or gallops.  Respiratory: Clear to  auscultation bilaterally. GI: Soft, nontender, non-distended  MS: No edema; No deformity. Neuro:  Nonfocal  Psych: Normal affect   Labs    High Sensitivity Troponin:   Recent Labs  Lab 07/04/22 1331 07/04/22 1528  TROPONINIHS 36* 31*     Chemistry Recent Labs  Lab 07/04/22 1331 07/05/22 0506 07/06/22 0605 07/07/22 0523  NA 137 137 135 132*  K 4.1 3.6 3.9 4.7  CL 105 102 101 95*  CO2 24 25 22 24   GLUCOSE 120* 104* 116* 193*  BUN 17 17 21* 23*  CREATININE 1.24 1.34* 1.32* 1.64*  CALCIUM 8.4* 8.2* 8.5* 8.6*  MG  --  2.1  --   --   PROT 7.8  --   --   --   ALBUMIN 3.6  --   --   --   AST 36  --   --   --   ALT 18  --   --   --   ALKPHOS 76  --   --   --   BILITOT 0.6  --   --   --   GFRNONAA >60 >60 >60 48*  ANIONGAP 8 10 12  13  Lipids  Recent Labs  Lab 07/06/22 0605  CHOL 139  TRIG 87  HDL 25*  LDLCALC 97  CHOLHDL 5.6    Hematology Recent Labs  Lab 07/04/22 1331 07/05/22 0506  WBC 7.8 8.0  RBC 4.31 4.21*  HGB 13.3 13.4  HCT 40.9 39.8  MCV 94.9 94.5  MCH 30.9 31.8  MCHC 32.5 33.7  RDW 13.3 13.3  PLT 184 174   Thyroid No results for input(s): "TSH", "FREET4" in the last 168 hours.  BNP Recent Labs  Lab 07/04/22 1331  BNP 596.1*    DDimer No results for input(s): "DDIMER" in the last 168 hours.   Radiology    ECHOCARDIOGRAM COMPLETE  Result Date: 07/05/2022    ECHOCARDIOGRAM REPORT   Patient Name:   BARACK NICODEMUS Date of Exam: 07/05/2022 Medical Rec #:  [id number]     Height:       79.0 in Accession #:    [id number]    Weight:       281.7 lb Date of Birth:  Jun 02, 1960     BSA:          2.636 m Patient Age:    62 years      BP:           145/97 mmHg Patient Gender: M             HR:           82 bpm. Exam Location:  Inpatient Procedure: 2D Echo, Cardiac Doppler, Color Doppler and Intracardiac            Opacification Agent Indications:    CHF  History:        Patient has no prior history of Echocardiogram examinations.                  Signs/Symptoms:Dyspnea; Risk Factors:Diabetes.  Sonographer:    Milbert Coulter Referring Phys: [id number] VISHAL R PATEL IMPRESSIONS  1. Challenging windows. global hypokinesis, appears worse in the anterior/anteroseptal/inferoseptal wall from base to the apex. Left ventricular ejection fraction, by estimation, is 30 to 35%. The left ventricle has moderately decreased function. There is mild left ventricular hypertrophy. Left ventricular diastolic parameters are indeterminate.  2. Right ventricular systolic function is normal. The right ventricular size is normal. There is mildly elevated pulmonary artery systolic pressure.  3. No evidence of mitral valve regurgitation.  4. The aortic valve was not well visualized. Aortic valve regurgitation is not visualized.  5. Aortic not well visualized.  6. The inferior vena cava is normal in size with <50% respiratory variability, suggesting right atrial pressure of 8 mmHg. Comparison(s): No prior Echocardiogram. FINDINGS  Left Ventricle: Challenging windows. global hypokinesis, appears worse in the anterior/anteroseptal/inferoseptal wall from base to the apex. Left ventricular ejection fraction, by estimation, is 30 to 35%. The left ventricle has moderately decreased function. Definity contrast agent was given IV to delineate the left ventricular endocardial borders. The left ventricular internal cavity size was normal in size. There is mild left ventricular hypertrophy. Left ventricular diastolic parameters are indeterminate. Right Ventricle: The right ventricular size is normal. Right ventricular systolic function is normal. There is mildly elevated pulmonary artery systolic pressure. The tricuspid regurgitant velocity is 2.89 m/s, and with an assumed right atrial pressure of 8 mmHg, the estimated right ventricular systolic pressure is 41.4 mmHg. Left Atrium: Left atrial size was normal in size. Right Atrium: Right atrial size was normal in size. Pericardium: There is no  evidence of pericardial effusion. Mitral  Valve: No evidence of mitral valve regurgitation. Tricuspid Valve: Tricuspid valve regurgitation is mild. Aortic Valve: The aortic valve was not well visualized. Aortic valve regurgitation is not visualized. Pulmonic Valve: Pulmonic valve regurgitation is not visualized. Aorta: Not well visualized. Venous: The inferior vena cava is normal in size with less than 50% respiratory variability, suggesting right atrial pressure of 8 mmHg. IAS/Shunts: The interatrial septum was not well visualized.  LEFT VENTRICLE PLAX 2D LVIDd:         6.30 cm      Diastology LVIDs:         5.60 cm      LV e' medial:    6.20 cm/s LV PW:         1.40 cm      LV E/e' medial:  14.6 LV IVS:        1.30 cm      LV e' lateral:   10.10 cm/s LVOT diam:     2.80 cm      LV E/e' lateral: 9.0 LVOT Area:     6.16 cm  LV Volumes (MOD) LV vol d, MOD A2C: 103.0 ml LV vol d, MOD A4C: 167.0 ml LV vol s, MOD A2C: 84.7 ml LV vol s, MOD A4C: 113.0 ml LV SV MOD A2C:     18.3 ml LV SV MOD A4C:     167.0 ml LV SV MOD BP:      42.2 ml RIGHT VENTRICLE RV Basal diam:  2.80 cm RV Mid diam:    1.90 cm RV S prime:     11.10 cm/s TAPSE (M-mode): 2.2 cm LEFT ATRIUM              Index        RIGHT ATRIUM           Index LA Vol (A2C):   113.0 ml 42.86 ml/m  RA Area:     22.60 cm LA Vol (A4C):   84.3 ml  31.98 ml/m  RA Volume:   69.00 ml  26.17 ml/m LA Biplane Vol: 101.0 ml 38.31 ml/m  MITRAL VALVE               TRICUSPID VALVE MV Area (PHT): 4.80 cm    TR Peak grad:   33.4 mmHg MV Decel Time: 158 msec    TR Vmax:        289.00 cm/s MV E velocity: 90.40 cm/s MV A velocity: 29.10 cm/s  SHUNTS MV E/A ratio:  3.11        Systemic Diam: 2.80 cm Carolan Clines Electronically signed by Carolan Clines Signature Date/Time: 07/05/2022/10:22:30 AM    Final     Cardiac Studies   ECHO 07/05/2022  1. Challenging windows. global hypokinesis, appears worse in the anterior/anteroseptal/inferoseptal wall from base to the apex. Left  ventricular ejection fraction, by estimation, is 30 to 35%. The left ventricle has moderately decreased function. There is mild left ventricular hypertrophy. Left ventricular diastolic parameters are indeterminate.   2. Right ventricular systolic function is normal. The right ventricular size is normal. There is mildly elevated pulmonary artery systolic pressure.   3. No evidence of mitral valve regurgitation.   4. The aortic valve was not well visualized. Aortic valve regurgitation is not visualized.   5. Aortic not well visualized.   6. The inferior vena cava is normal in size with <50% respiratory variability, suggesting right atrial pressure of 8 mmHg.  Comparison(s): No prior Echocardiogram.   Patient Profile  62 y.o. male with type 2 diabetes mellitus and remote history of pulmonary embolism presents with acute pulmonary edema and newly diagnosed combined systolic and diastolic heart failure.   Assessment & Plan    Seems to have reached euvolemia. Coronary CT angio cannot be performed today, scheduled for tomorrow morning. Hold diuretics in anticipation of CT angiogram. If he is not found to have significant CAD, further titration of heart failure medications as an outpatient. If there is significant CAD, depending on renal function parameters, will schedule for either inpatient or outpatient right and left heart catheterization.     For questions or updates, please contact [facility] Please consult [email or web address] for contact info under        Signed, Thurmon Fair, MD  07/07/2022, 9:15 AM

## 2022-07-07 NOTE — Progress Notes (Signed)
[facility] PROGRESS NOTE  Jacob Donaldson (DOB: [birth year]) YQI:[id number] PCP: Georgianne Fick, MD  Brief Narrative: Jacob Donaldson is a 62 y.o. male with a history of T2DM, PE no longer anticoagulated who presented to the ED on 07/04/2022 with a week of worsening orthopnea, dyspnea on exertion. He was admitted for presumptive acute HFrEF, LVEF confirmed to be 30-35%. GDMT has been initiated and IV diuresis is ongoing with cardiology consultation. Clinical volume status has normalized. Coronary CTA planned 4/29.  Subjective: Orthopnea seems to have resolved, no chest pain or other complaints, "I feel good."   Objective: BP (!) 139/97   Pulse 73   Temp (!) 97.5 F (36.4 C) (Oral)   Resp 19   Ht 6\' 7"  (2.007 m)   Wt 125.6 kg   SpO2 100%   BMI 31.19 kg/m   Gen: No distress, pleasant Pulm: Clear and nonlabored  CV: RRR, no MRG, no edema GI: Soft, NT, ND, +BS  Neuro: Alert and oriented. No new focal deficits. Ext: Warm, no deformities. Skin: No rashes, lesions or ulcers on visualized skin   Assessment & Plan: Acute HFrEF:  - SCr up a bit today, has had significant diuresis and improvement in symptoms. On exam appears euvolemic. Hold lasix. Weight down 129 >> 125kg. - Initiated metoprolol succinate and entresto 4/26. VSS, has BP room for titration. With HbA1c 7.1%, would benefit from SGLT2i.  - ICM evaluation per cardiology > coronary CTA 4/29. If positive findings, may pursue LHC/RHC as inpatient, otherwise would DC to titrate GDMT as outpatient. - HF follow up 5/13 at 11am.   History of PE:  - VTE ppx with lovenox 0.5mg /kg q24h, no PE on CTA  T2DM:  - HbA1c 7.1%, continue SSI  Tyrone Nine, MD [facility] [email or web address] 07/07/2022, 11:42 AM

## 2022-07-07 NOTE — Plan of Care (Signed)
Patient remains in PCU. No supplemental O2. Pending cardiac CTA 07/08/22.   Problem: Education: Goal: Knowledge of General Education information will improve Description: Including pain rating scale, medication(s)/side effects and non-pharmacologic comfort measures Outcome: Progressing   Problem: Health Behavior/Discharge Planning: Goal: Ability to manage health-related needs will improve Outcome: Progressing   Problem: Clinical Measurements: Goal: Ability to maintain clinical measurements within normal limits will improve Outcome: Progressing Goal: Will remain free from infection Outcome: Progressing Goal: Diagnostic test results will improve Outcome: Progressing Goal: Respiratory complications will improve Outcome: Progressing Goal: Cardiovascular complication will be avoided Outcome: Progressing   Problem: Activity: Goal: Risk for activity intolerance will decrease Outcome: Progressing   Problem: Nutrition: Goal: Adequate nutrition will be maintained Outcome: Progressing   Problem: Coping: Goal: Level of anxiety will decrease Outcome: Progressing   Problem: Elimination: Goal: Will not experience complications related to bowel motility Outcome: Progressing Goal: Will not experience complications related to urinary retention Outcome: Progressing   Problem: Pain Managment: Goal: General experience of comfort will improve Outcome: Progressing   Problem: Safety: Goal: Ability to remain free from injury will improve Outcome: Progressing   Problem: Skin Integrity: Goal: Risk for impaired skin integrity will decrease Outcome: Progressing   Problem: Education: Goal: Ability to demonstrate management of disease process will improve Outcome: Progressing Goal: Ability to verbalize understanding of medication therapies will improve Outcome: Progressing Goal: Individualized Educational Video(s) Outcome: Progressing   Problem: Activity: Goal: Capacity to carry out  activities will improve Outcome: Progressing   Problem: Cardiac: Goal: Ability to achieve and maintain adequate cardiopulmonary perfusion will improve Outcome: Progressing   Problem: Education: Goal: Ability to describe self-care measures that may prevent or decrease complications (Diabetes Survival Skills Education) will improve Outcome: Progressing Goal: Individualized Educational Video(s) Outcome: Progressing   Problem: Coping: Goal: Ability to adjust to condition or change in health will improve Outcome: Progressing   Problem: Fluid Volume: Goal: Ability to maintain a balanced intake and output will improve Outcome: Progressing   Problem: Health Behavior/Discharge Planning: Goal: Ability to identify and utilize available resources and services will improve Outcome: Progressing Goal: Ability to manage health-related needs will improve Outcome: Progressing   Problem: Metabolic: Goal: Ability to maintain appropriate glucose levels will improve Outcome: Progressing   Problem: Nutritional: Goal: Maintenance of adequate nutrition will improve Outcome: Progressing Goal: Progress toward achieving an optimal weight will improve Outcome: Progressing   Problem: Skin Integrity: Goal: Risk for impaired skin integrity will decrease Outcome: Progressing   Problem: Tissue Perfusion: Goal: Adequacy of tissue perfusion will improve Outcome: Progressing

## 2022-07-08 ENCOUNTER — Inpatient Hospital Stay (HOSPITAL_COMMUNITY): Payer: BC Managed Care – PPO

## 2022-07-08 DIAGNOSIS — I5021 Acute systolic (congestive) heart failure: Secondary | ICD-10-CM | POA: Diagnosis not present

## 2022-07-08 DIAGNOSIS — I5041 Acute combined systolic (congestive) and diastolic (congestive) heart failure: Secondary | ICD-10-CM | POA: Diagnosis not present

## 2022-07-08 DIAGNOSIS — I2510 Atherosclerotic heart disease of native coronary artery without angina pectoris: Secondary | ICD-10-CM | POA: Diagnosis not present

## 2022-07-08 LAB — BASIC METABOLIC PANEL
Anion gap: 11 (ref 5–15)
BUN: 30 mg/dL — ABNORMAL HIGH (ref 6–20)
CO2: 26 mmol/L (ref 22–32)
Calcium: 8.6 mg/dL — ABNORMAL LOW (ref 8.9–10.3)
Chloride: 98 mmol/L (ref 98–111)
Creatinine, Ser: 1.41 mg/dL — ABNORMAL HIGH (ref 0.61–1.24)
GFR, Estimated: 57 mL/min — ABNORMAL LOW (ref >60)
Glucose, Bld: 168 mg/dL — ABNORMAL HIGH (ref 70–99)
Potassium: 4.1 mmol/L (ref 3.5–5.1)
Sodium: 135 mmol/L (ref 135–145)

## 2022-07-08 LAB — GLUCOSE, CAPILLARY
Glucose-Capillary: 246 mg/dL — ABNORMAL HIGH (ref 70–99)
Glucose-Capillary: 153 mg/dL — ABNORMAL HIGH (ref 70–99)
Glucose-Capillary: 114 mg/dL — ABNORMAL HIGH (ref 70–99)

## 2022-07-08 MED ORDER — METOPROLOL SUCCINATE ER 25 MG PO TB24: 25.0000 mg | ORAL_TABLET | Freq: Every day | ORAL | 0 refills | Status: DC

## 2022-07-08 MED ORDER — IOHEXOL 350 MG/ML SOLN: 9.0000 mL | Freq: Once | INTRAVENOUS | Status: DC | PRN

## 2022-07-08 MED ORDER — IOHEXOL 350 MG/ML SOLN
95.0000 mL | Freq: Once | INTRAVENOUS | Status: AC | PRN
  Administered 2022-07-08: 95 mL via INTRAVENOUS

## 2022-07-08 MED ORDER — SACUBITRIL-VALSARTAN 24-26 MG PO TABS: 1.0000 | ORAL_TABLET | Freq: Two times a day (BID) | ORAL | 0 refills | Status: DC

## 2022-07-08 NOTE — Progress Notes (Signed)
Rounding Note    Patient Name: Jacob Donaldson Date of Encounter: 07/08/2022  [facility] Cardiologist: Verne Carrow, MD   Subjective   Awaiting cardiac CT. no significant shortness of breath.  Inpatient Medications    Scheduled Meds:  diphenhydrAMINE  50 mg Oral Once   Or   diphenhydrAMINE  50 mg Intravenous Once   enoxaparin (LOVENOX) injection  60 mg Subcutaneous Q24H   insulin aspart  0-5 Units Subcutaneous QHS   insulin aspart  0-9 Units Subcutaneous TID WC   metoprolol succinate  25 mg Oral Daily   sacubitril-valsartan  1 tablet Oral BID   sodium chloride flush  3 mL Intravenous Q12H   Continuous Infusions:  PRN Meds: acetaminophen **OR** acetaminophen, albuterol, ondansetron **OR** ondansetron (ZOFRAN) IV, mouth rinse, senna-docusate   Vital Signs    Vitals:   07/08/22 0100 07/08/22 0344 07/08/22 0400 07/08/22 0600  BP: 137/84 110/69    Pulse: 94 74    Resp: 15 (!) 21 19 12   Temp: 98 F (36.7 C) 97.7 F (36.5 C)    TempSrc: Oral Oral    SpO2: 99% 100%    Weight:    126.3 kg  Height:        Intake/Output Summary (Last 24 hours) at 07/08/2022 1008 Last data filed at 07/08/2022 0100 Gross per 24 hour  Intake 360 ml  Output 770 ml  Net -410 ml      07/08/2022    6:00 AM 07/07/2022    5:05 AM 07/06/2022    6:17 AM  Last 3 Weights  Weight (lbs) 278 lb 8 oz 276 lb 14.4 oz 278 lb 10.6 oz  Weight (kg) 126.327 kg 125.6 kg 126.4 kg      Telemetry    HR 80 SR - Personally Reviewed  ECG    No new - Personally Reviewed  Physical Exam   GEN: No acute distress.   Neck: No JVD Cardiac: RRR, no murmurs, rubs, or gallops.  Respiratory: Clear to auscultation bilaterally. GI: Soft, nontender, non-distended  MS: No edema; No deformity. Neuro:  Nonfocal  Psych: Normal affect   Labs    High Sensitivity Troponin:   Recent Labs  Lab 07/04/22 1331 07/04/22 1528  TROPONINIHS 36* 31*     Chemistry Recent Labs  Lab 07/04/22 1331  07/05/22 0506 07/06/22 0605 07/07/22 0523 07/08/22 0535  NA 137 137 135 132* 135  K 4.1 3.6 3.9 4.7 4.1  CL 105 102 101 95* 98  CO2 24 25 22 24 26   GLUCOSE 120* 104* 116* 193* 168*  BUN 17 17 21* 23* 30*  CREATININE 1.24 1.34* 1.32* 1.64* 1.41*  CALCIUM 8.4* 8.2* 8.5* 8.6* 8.6*  MG  --  2.1  --   --   --   PROT 7.8  --   --   --   --   ALBUMIN 3.6  --   --   --   --   AST 36  --   --   --   --   ALT 18  --   --   --   --   ALKPHOS 76  --   --   --   --   BILITOT 0.6  --   --   --   --   GFRNONAA >60 >60 >60 48* 57*  ANIONGAP 8 10 12 13 11     Lipids  Recent Labs  Lab 07/06/22 0605  CHOL 139  TRIG 87  HDL  25*  LDLCALC 97  CHOLHDL 5.6    Hematology Recent Labs  Lab 07/04/22 1331 07/05/22 0506  WBC 7.8 8.0  RBC 4.31 4.21*  HGB 13.3 13.4  HCT 40.9 39.8  MCV 94.9 94.5  MCH 30.9 31.8  MCHC 32.5 33.7  RDW 13.3 13.3  PLT 184 174   Thyroid No results for input(s): "TSH", "FREET4" in the last 168 hours.  BNP Recent Labs  Lab 07/04/22 1331  BNP 596.1*    DDimer No results for input(s): "DDIMER" in the last 168 hours.   Radiology    No results found.  Cardiac Studies   Cardiac Studies & Procedures       ECHOCARDIOGRAM  ECHOCARDIOGRAM COMPLETE 07/05/2022  Narrative ECHOCARDIOGRAM REPORT    Patient Name:   Jacob Donaldson Date of Exam: 07/05/2022 Medical Rec #:  [id number]     Height:       79.0 in Accession #:    [id number]    Weight:       281.7 lb Date of Birth:  Jul 23, 1960     BSA:          2.636 m Patient Age:    61 years      BP:           145/97 mmHg Patient Gender: M             HR:           82 bpm. Exam Location:  Inpatient  Procedure: 2D Echo, Cardiac Doppler, Color Doppler and Intracardiac Opacification Agent  Indications:    CHF  History:        Patient has no prior history of Echocardiogram examinations. Signs/Symptoms:Dyspnea; Risk Factors:Diabetes.  Sonographer:    Milbert Coulter Referring Phys: [id number] VISHAL R  PATEL  IMPRESSIONS   1. Challenging windows. global hypokinesis, appears worse in the anterior/anteroseptal/inferoseptal wall from base to the apex. Left ventricular ejection fraction, by estimation, is 30 to 35%. The left ventricle has moderately decreased function. There is mild left ventricular hypertrophy. Left ventricular diastolic parameters are indeterminate. 2. Right ventricular systolic function is normal. The right ventricular size is normal. There is mildly elevated pulmonary artery systolic pressure. 3. No evidence of mitral valve regurgitation. 4. The aortic valve was not well visualized. Aortic valve regurgitation is not visualized. 5. Aortic not well visualized. 6. The inferior vena cava is normal in size with <50% respiratory variability, suggesting right atrial pressure of 8 mmHg.  Comparison(s): No prior Echocardiogram.  FINDINGS Left Ventricle: Challenging windows. global hypokinesis, appears worse in the anterior/anteroseptal/inferoseptal wall from base to the apex. Left ventricular ejection fraction, by estimation, is 30 to 35%. The left ventricle has moderately decreased function. Definity contrast agent was given IV to delineate the left ventricular endocardial borders. The left ventricular internal cavity size was normal in size. There is mild left ventricular hypertrophy. Left ventricular diastolic parameters are indeterminate.  Right Ventricle: The right ventricular size is normal. Right ventricular systolic function is normal. There is mildly elevated pulmonary artery systolic pressure. The tricuspid regurgitant velocity is 2.89 m/s, and with an assumed right atrial pressure of 8 mmHg, the estimated right ventricular systolic pressure is 41.4 mmHg.  Left Atrium: Left atrial size was normal in size.  Right Atrium: Right atrial size was normal in size.  Pericardium: There is no evidence of pericardial effusion.  Mitral Valve: No evidence of mitral valve  regurgitation.  Tricuspid Valve: Tricuspid valve regurgitation is mild.  Aortic Valve: The aortic valve  was not well visualized. Aortic valve regurgitation is not visualized.  Pulmonic Valve: Pulmonic valve regurgitation is not visualized.  Aorta: Not well visualized.  Venous: The inferior vena cava is normal in size with less than 50% respiratory variability, suggesting right atrial pressure of 8 mmHg.  IAS/Shunts: The interatrial septum was not well visualized.   LEFT VENTRICLE PLAX 2D LVIDd:         6.30 cm      Diastology LVIDs:         5.60 cm      LV e' medial:    6.20 cm/s LV PW:         1.40 cm      LV E/e' medial:  14.6 LV IVS:        1.30 cm      LV e' lateral:   10.10 cm/s LVOT diam:     2.80 cm      LV E/e' lateral: 9.0 LVOT Area:     6.16 cm  LV Volumes (MOD) LV vol d, MOD A2C: 103.0 ml LV vol d, MOD A4C: 167.0 ml LV vol s, MOD A2C: 84.7 ml LV vol s, MOD A4C: 113.0 ml LV SV MOD A2C:     18.3 ml LV SV MOD A4C:     167.0 ml LV SV MOD BP:      42.2 ml  RIGHT VENTRICLE RV Basal diam:  2.80 cm RV Mid diam:    1.90 cm RV S prime:     11.10 cm/s TAPSE (M-mode): 2.2 cm  LEFT ATRIUM              Index        RIGHT ATRIUM           Index LA Vol (A2C):   113.0 ml 42.86 ml/m  RA Area:     22.60 cm LA Vol (A4C):   84.3 ml  31.98 ml/m  RA Volume:   69.00 ml  26.17 ml/m LA Biplane Vol: 101.0 ml 38.31 ml/m MITRAL VALVE               TRICUSPID VALVE MV Area (PHT): 4.80 cm    TR Peak grad:   33.4 mmHg MV Decel Time: 158 msec    TR Vmax:        289.00 cm/s MV E velocity: 90.40 cm/s MV A velocity: 29.10 cm/s  SHUNTS MV E/A ratio:  3.11        Systemic Diam: 2.80 cm  Carolan Clines Electronically signed by Carolan Clines Signature Date/Time: 07/05/2022/10:22:30 AM    Final              Patient Profile     62 y.o. male with newly diagnosed cardiomyopathy ejection fraction 30 to 35% with diabetes remote history of PE here with acute systolic heart  failure. Assessment & Plan    Acute systolic heart failure with decreased ejection fraction 30 to 35% - We will go ahead and proceed with coronary CT scan.  Metoprolol 50 x 1 given. Question ischemic cardiomyopathy.  He has had previous nausea with contrast.  He was given premedication for this.  Never had anaphylaxis.  Because of the low severity, I have taken this out of his allergy list. -Diuretics are on hold.  Creatinine slightly improved this morning 1.4 down from 1.6. -If significant CAD is present, will need right and left heart catheterization. -Appears euvolemic.  Symptoms have been present for about 3 weeks.  However presence of  left ventricular dilatation suggest a more chronic abnormality.  Chronic kidney disease stage IIIa - Creatinine typically 1.4 over the last 8 years.  Nausea - Still having some nausea, Zofran administered.     For questions or updates, please contact [facility] Please consult [email or web address] for contact info under        Signed, Donato Schultz, MD  07/08/2022, 10:08 AM

## 2022-07-08 NOTE — Progress Notes (Signed)
[facility] PROGRESS NOTE  Jacob Donaldson (DOB: 02-23-1961) ZOX:[id number] PCP: Georgianne Fick, MD  Brief Narrative: Jacob Donaldson is a 62 y.o. male with a history of T2DM, PE no longer anticoagulated who presented to the ED on 07/04/2022 with a week of worsening orthopnea, dyspnea on exertion. He was admitted for presumptive acute HFrEF, LVEF confirmed to be 30-35%. GDMT has been initiated and IV diuresis is ongoing with cardiology consultation. Clinical volume status has normalized. Coronary CTA planned 4/29.  Subjective: Shortness of breath still resolved. Had an episode he calls regurgitation last night where he threw up food he just ate, blames it on the food, doesn't happen typically to him. Feels fine now.  Objective: BP 110/69 (BP Location: Left Arm)   Pulse 74   Temp 97.7 F (36.5 C) (Oral)   Resp 12   Ht 6\' 7"  (2.007 m)   Wt 126.3 kg   SpO2 100%   BMI 31.37 kg/m   No distress Clear, nonlabored RRR, no MRG or edema  Assessment & Plan: Acute HFrEF: LVEF 30-35%. Has had significant diuresis and improvement in symptoms. On exam appears euvolemic. Hold lasix again today. Weight down 129 >> 126.3kg. - Initiated metoprolol succinate and entresto 4/26. VSS, has BP room for titration. With HbA1c 7.1%, would benefit from SGLT2i. Will discuss with cardiology at time of discharge. - ICM evaluation per cardiology > coronary CTA 4/29. If positive findings, may pursue LHC/RHC as inpatient, otherwise would DC to titrate GDMT as outpatient. - HF follow up 5/13 at 11am.   Stage II CKD: CrCl between 60-44ml/min.  - Avoid nephrotoxins. Creatinine improved when lasix held, suggest recheck at follow up.   History of PE:  - VTE ppx with lovenox 0.5mg /kg q24h, no PE on CTA  T2DM:  - HbA1c 7.1%, continue SSI  Tyrone Nine, MD [facility] [email or web address] 07/08/2022, 10:21 AM

## 2022-07-08 NOTE — Progress Notes (Signed)
    Coronary CT scan shows mild nonobstructive coronary artery disease.  Nonischemic cardiomyopathy.  Continue with goal-directed medical therapy.  Okay to discharge on Toprol 25 mg daily Entresto 24/26 p.o. twice daily.  Will set up follow-up as outpatient.  Donato Schultz, MD

## 2022-07-08 NOTE — Progress Notes (Signed)
Patient here with new HF diagnosis, with new emesis X 1 tonight. Patient had ambulated to restroom to void then spontaneous vomited. Patient denies chest pain, dizziness, or SOB. Emesis appears as undigested food. PRN zofran given X 1 per orders. VS unremarkable: 98.0, 94, 16, 137/84 (99), 99%. CBG = 246. No obvious aberrant changes noted upon my personal review of telemetry. Patient denies on-going nausea. The patient states he believes the episode was related to food. NP Chinita Greenland notified of the change.

## 2022-07-08 NOTE — Discharge Summary (Signed)
Physician Discharge Summary   Patient: Jacob Donaldson MRN: [id number] DOB: 1960/03/14  Admit date:     07/04/2022  Discharge date: {dischdate:26783}  Discharge Physician: Tyrone Nine   PCP: Georgianne Fick, MD   Recommendations at discharge:  {Tip this will not be part of the note when signed- Example include specific recommendations for outpatient follow-up, pending tests to follow-up on. (Optional):26781}  ***  Discharge Diagnoses: Principal Problem:   Acute CHF (congestive heart failure) (HCC) Active Problems:   Type 2 diabetes mellitus (HCC)   Acute systolic CHF (congestive heart failure), NYHA class 4 (HCC)   Acute pulmonary edema (HCC)   Acute combined systolic and diastolic heart failure (HCC)  Resolved Problems:   * No resolved hospital problems. *  Hospital Course: Jacob Donaldson is a 62 y.o. 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  Assessment and Plan: * Acute CHF (congestive heart failure) (HCC) Presenting with DOE, orthopnea, elevated BNP.  CT consistent with CHF and pulmonary edema.  No evidence of PE.  Mildly elevated troponin likely demand ischemia in setting of new diagnosis of acute CHF.  He has not had any chest pain.  No prior echo on file, unspecified whether systolic or diastolic. -Continue IV Lasix 40 mg twice daily -Obtain echocardiogram -Strict I/O's and daily weights -Further GDMT pending echo results  Type 2 diabetes mellitus (HCC) Patient states he has not taken any medications for diabetes. -Placed on SSI -Check A1c      {Tip this will not be part of the note when signed Body mass index is 31.37 kg/m. , ,  (Optional):26781}  {(NOTE) Pain control PDMP Statment (Optional):26782} Consultants: *** Procedures performed: ***  Disposition: {Plan; Disposition:26390} Diet recommendation:  Discharge Diet Orders (From admission, onward)     Start      Ordered   07/08/22 0000  Diet - low sodium heart healthy        07/08/22 1529           {Diet_Plan:26776} DISCHARGE MEDICATION: Allergies as of 07/08/2022   No Active Allergies      Medication List     STOP taking these medications    Aleve 220 MG tablet Generic drug: naproxen sodium   cyclobenzaprine 10 MG tablet Commonly known as: FLEXERIL   insulin aspart 100 UNIT/ML FlexPen Commonly known as: NovoLOG FlexPen   insulin glargine 100 UNIT/ML Solostar Pen Commonly known as: LANTUS   oxyCODONE-acetaminophen 5-325 MG tablet Commonly known as: PERCOCET/ROXICET   polyethylene glycol 17 g packet Commonly known as: MiraLax       TAKE these medications    aspirin EC 81 MG tablet Take 81 mg by mouth 2 (two) times a week. Swallow whole.   metoprolol succinate 25 MG 24 hr tablet Commonly known as: TOPROL-XL Take 1 tablet (25 mg total) by mouth daily. Start taking on: July 09, 2022   sacubitril-valsartan 24-26 MG Commonly known as: ENTRESTO Take 1 tablet by mouth 2 (two) times daily.        Follow-up Information     [facility]. Go on 07/22/2022.   Specialty: Cardiology Why: Hospital follow up 07/22/2022 @ 11 am PLEASE bring a current medication list to appointment FREE valet parking, Entrance C, off [employer] information: [address] [id number] mc [place] [address] [phone]        Gaston Islam., NP  Follow up on 07/25/2022.   Specialty: Cardiology Why: 1:55PM. Cardiology follow up Contact information: [address] [place] [address] [phone]                Discharge Exam: Ceasar Mons Weights   07/06/22 0617 07/07/22 0505 07/08/22 0600  Weight: 126.4 kg 125.6 kg 126.3 kg   ***  Condition at discharge: {DC Condition:26389}  The results of significant diagnostics from this hospitalization (including imaging, microbiology,  ancillary and laboratory) are listed below for reference.   Imaging Studies: CT CORONARY MORPH W/CTA COR W/SCORE W/CA W/CM &/OR WO/CM  Result Date: 07/08/2022 CLINICAL DATA:  39M with acute systolic heart failure EXAM: Cardiac/Coronary CTA TECHNIQUE: The patient was scanned on a [employer]. FINDINGS: A 100 kV prospective scan was triggered in the descending thoracic aorta at 111 HU's. Axial non-contrast 3 mm slices were carried out through the heart. The data set was analyzed on a dedicated work station and scored using the Agatson method. Gantry rotation speed was 250 msecs and collimation was .6 mm. No beta blockade and 0.8 mg of sl NTG was given. The 3D data set was reconstructed in 5% intervals of the 35-75% of the R-R cycle. Phases were analyzed on a dedicated work station using MPR, MIP and VRT modes. The patient received 100 cc of contrast. Coronary Arteries:  Normal coronary origin.  Right dominance. RCA is a large dominant artery that gives rise to PDA and PLA. Noncalcified plaque in mid RCA causes 0-24% stenosis Left main is a large artery that gives rise to LAD and LCX arteries. LAD is a large vessel. Noncalcified plaque in proximal LAD causes 0-24% stenosis. Calcified plaque in distal LAD causes 25-49% stenosis. Noncalcified plaque in ostial D1 causes 0-24% stenosis LCX is a non-dominant artery that gives rise to one large OM1 branch. Noncalcified plaque in proximal LCX causes 0-24% stenosis Noncalcified plaque in ostial ramus causes 0-24% stenosis Other findings: Left Ventricle: Mild dilatation Left Atrium: Moderate enlargement Pulmonary Veins: Normal configuration Right Ventricle: Mild dilatation Right Atrium: Mild enlargement Cardiac valves: No calcifications Thoracic aorta: Dilated ascending aorta measuring 41mm Pulmonary Arteries: Normal size Systemic Veins: Normal drainage Pericardium: Normal thickness IMPRESSION: 1. Coronary calcium score of 12. This was 66th percentile for age  and sex matched control. 2.  Normal coronary origin with right dominance. 3.  Nonobstructive CAD. 4.  Mild (25-49%) stenosis in distal LAD 5. Minimal (0-24%) stenosis in proximal LAD, ostial D1, ostial ramus, proximal LCX, and mid RCA 6.  Dilated ascending aorta measuring 41mm CAD-RADS 2. Mild non-obstructive CAD (25-49%). Consider non-atherosclerotic causes of chest pain. Consider preventive therapy and risk factor modification. Electronically Signed   By: Epifanio Lesches M.D.   On: 07/08/2022 14:44   ECHOCARDIOGRAM COMPLETE  Result Date: 07/05/2022    ECHOCARDIOGRAM REPORT   Patient Name:   Jacob Donaldson Date of Exam: 07/05/2022 Medical Rec #:  [id number]     Height:       79.0 in Accession #:    [id number]    Weight:       281.7 lb Date of Birth:  March 08, 1961     BSA:          2.636 m Patient Age:    61 years      BP:           145/97 mmHg Patient Gender: M             HR:  82 bpm. Exam Location:  Inpatient Procedure: 2D Echo, Cardiac Doppler, Color Doppler and Intracardiac            Opacification Agent Indications:    CHF  History:        Patient has no prior history of Echocardiogram examinations.                 Signs/Symptoms:Dyspnea; Risk Factors:Diabetes.  Sonographer:    Milbert Coulter Referring Phys: [id number] VISHAL R PATEL IMPRESSIONS  1. Challenging windows. global hypokinesis, appears worse in the anterior/anteroseptal/inferoseptal wall from base to the apex. Left ventricular ejection fraction, by estimation, is 30 to 35%. The left ventricle has moderately decreased function. There is mild left ventricular hypertrophy. Left ventricular diastolic parameters are indeterminate.  2. Right ventricular systolic function is normal. The right ventricular size is normal. There is mildly elevated pulmonary artery systolic pressure.  3. No evidence of mitral valve regurgitation.  4. The aortic valve was not well visualized. Aortic valve regurgitation is not visualized.  5. Aortic not well  visualized.  6. The inferior vena cava is normal in size with <50% respiratory variability, suggesting right atrial pressure of 8 mmHg. Comparison(s): No prior Echocardiogram. FINDINGS  Left Ventricle: Challenging windows. global hypokinesis, appears worse in the anterior/anteroseptal/inferoseptal wall from base to the apex. Left ventricular ejection fraction, by estimation, is 30 to 35%. The left ventricle has moderately decreased function. Definity contrast agent was given IV to delineate the left ventricular endocardial borders. The left ventricular internal cavity size was normal in size. There is mild left ventricular hypertrophy. Left ventricular diastolic parameters are indeterminate. Right Ventricle: The right ventricular size is normal. Right ventricular systolic function is normal. There is mildly elevated pulmonary artery systolic pressure. The tricuspid regurgitant velocity is 2.89 m/s, and with an assumed right atrial pressure of 8 mmHg, the estimated right ventricular systolic pressure is 41.4 mmHg. Left Atrium: Left atrial size was normal in size. Right Atrium: Right atrial size was normal in size. Pericardium: There is no evidence of pericardial effusion. Mitral Valve: No evidence of mitral valve regurgitation. Tricuspid Valve: Tricuspid valve regurgitation is mild. Aortic Valve: The aortic valve was not well visualized. Aortic valve regurgitation is not visualized. Pulmonic Valve: Pulmonic valve regurgitation is not visualized. Aorta: Not well visualized. Venous: The inferior vena cava is normal in size with less than 50% respiratory variability, suggesting right atrial pressure of 8 mmHg. IAS/Shunts: The interatrial septum was not well visualized.  LEFT VENTRICLE PLAX 2D LVIDd:         6.30 cm      Diastology LVIDs:         5.60 cm      LV e' medial:    6.20 cm/s LV PW:         1.40 cm      LV E/e' medial:  14.6 LV IVS:        1.30 cm      LV e' lateral:   10.10 cm/s LVOT diam:     2.80 cm      LV  E/e' lateral: 9.0 LVOT Area:     6.16 cm  LV Volumes (MOD) LV vol d, MOD A2C: 103.0 ml LV vol d, MOD A4C: 167.0 ml LV vol s, MOD A2C: 84.7 ml LV vol s, MOD A4C: 113.0 ml LV SV MOD A2C:     18.3 ml LV SV MOD A4C:     167.0 ml LV SV MOD BP:  42.2 ml RIGHT VENTRICLE RV Basal diam:  2.80 cm RV Mid diam:    1.90 cm RV S prime:     11.10 cm/s TAPSE (M-mode): 2.2 cm LEFT ATRIUM              Index        RIGHT ATRIUM           Index LA Vol (A2C):   113.0 ml 42.86 ml/m  RA Area:     22.60 cm LA Vol (A4C):   84.3 ml  31.98 ml/m  RA Volume:   69.00 ml  26.17 ml/m LA Biplane Vol: 101.0 ml 38.31 ml/m  MITRAL VALVE               TRICUSPID VALVE MV Area (PHT): 4.80 cm    TR Peak grad:   33.4 mmHg MV Decel Time: 158 msec    TR Vmax:        289.00 cm/s MV E velocity: 90.40 cm/s MV A velocity: 29.10 cm/s  SHUNTS MV E/A ratio:  3.11        Systemic Diam: 2.80 cm Carolan Clines Electronically signed by Carolan Clines Signature Date/Time: 07/05/2022/10:22:30 AM    Final    CT Angio Chest PE W/Cm &/Or Wo Cm  Result Date: 07/04/2022 CLINICAL DATA:  Short of breath for 1 week, positive D-dimer EXAM: CT ANGIOGRAPHY CHEST WITH CONTRAST TECHNIQUE: Multidetector CT imaging of the chest was performed using the standard protocol during bolus administration of intravenous contrast. Multiplanar CT image reconstructions and MIPs were obtained to evaluate the vascular anatomy. RADIATION DOSE REDUCTION: This exam was performed according to the departmental dose-optimization program which includes automated exposure control, adjustment of the mA and/or kV according to patient size and/or use of iterative reconstruction technique. CONTRAST:  OMNIPAQUE IOHEXOL 350 MG/ML SOLN COMPARISON:  07/04/2022, 02/23/2019 FINDINGS: Cardiovascular: This is a technically adequate evaluation of the pulmonary vasculature. There are no filling defects or pulmonary emboli. The heart is enlarged, with trace pericardial fluid unchanged. Normal caliber of the  thoracic aorta. Evaluation of the aortic lumen is limited by timing of the contrast bolus. Mediastinum/Nodes: There is mediastinal and bilateral hilar lymphadenopathy, largest lymph node in the right paratracheal region measuring 15 mm in short axis. Thyroid, trachea, and esophagus are grossly unremarkable. Lungs/Pleura: There are small bilateral pleural effusions, with pleural fluid loculated in the right major and minor fissures likely accounting for the chest x-ray densities. There is central vascular congestion with diffuse interlobular septal thickening consistent with developing interstitial edema. No pneumothorax. Central airways are patent. Upper Abdomen: No acute abnormality. Musculoskeletal: There are no acute or destructive bony lesions. Reconstructed images demonstrate no additional findings. Review of the MIP images confirms the above findings. IMPRESSION: 1. No evidence of pulmonary embolus. 2. Findings most consistent with congestive heart failure and developing pulmonary edema. 3. Small bilateral pleural effusions, with loculated fluid in the right major and minor fissures likely accounting for the chest x-ray density. 4. Mediastinal and hilar lymphadenopathy, nonspecific but likely reactive. Electronically Signed   By: Sharlet Salina M.D.   On: 07/04/2022 15:29   DG Chest Port 1 View  Result Date: 07/04/2022 CLINICAL DATA:  Shortness of breath. EXAM: PORTABLE CHEST 1 VIEW COMPARISON:  February 23, 2019 FINDINGS: Normal for AP technique cardiac silhouette. No evidence of pneumothorax or pleural effusion. Hazy bilateral airspace opacities with lower lobe predominance. More focal airspace opacity in the right mid lung field. Osseous structures are without acute abnormality.  Soft tissues are grossly normal. IMPRESSION: 1. Hazy bilateral airspace opacities with lower lobe predominance. 2. More focal airspace opacity in the right mid lung field. This may represent focal airspace consolidation,  pulmonary mass or potentially fluid within the major fissure. Consider further evaluation with chest CT. Electronically Signed   By: Ted Mcalpine M.D.   On: 07/04/2022 13:34    Microbiology: Results for orders placed or performed during the hospital encounter of 10/08/14  MRSA PCR Screening     Status: None   Collection Time: 10/08/14  1:18 PM   Specimen: Nasal Mucosa; Nasopharyngeal  Result Value Ref Range Status   MRSA by PCR NEGATIVE NEGATIVE Final    Comment:        The GeneXpert MRSA Assay (FDA approved for NASAL specimens only), is one component of a comprehensive MRSA colonization surveillance program. It is not intended to diagnose MRSA infection nor to guide or monitor treatment for MRSA infections.     Labs: CBC: Recent Labs  Lab 07/04/22 1331 07/05/22 0506  WBC 7.8 8.0  NEUTROABS 5.4  --   HGB 13.3 13.4  HCT 40.9 39.8  MCV 94.9 94.5  PLT 184 174   Basic Metabolic Panel: Recent Labs  Lab 07/04/22 1331 07/05/22 0506 07/06/22 0605 07/07/22 0523 07/08/22 0535  NA 137 137 135 132* 135  K 4.1 3.6 3.9 4.7 4.1  CL 105 102 101 95* 98  CO2 24 25 22 24 26   GLUCOSE 120* 104* 116* 193* 168*  BUN 17 17 21* 23* 30*  CREATININE 1.24 1.34* 1.32* 1.64* 1.41*  CALCIUM 8.4* 8.2* 8.5* 8.6* 8.6*  MG  --  2.1  --   --   --    Liver Function Tests: Recent Labs  Lab 07/04/22 1331  AST 36  ALT 18  ALKPHOS 76  BILITOT 0.6  PROT 7.8  ALBUMIN 3.6   CBG: Recent Labs  Lab 07/07/22 1644 07/07/22 2043 07/08/22 0105 07/08/22 0732 07/08/22 1119  GLUCAP 197* 300* 246* 153* 114*    Discharge time spent: {LESS THAN/GREATER THAN:26388} 30 minutes.  Signed: Tyrone Nine, MD [facility] 07/08/2022

## 2022-07-08 NOTE — Inpatient Diabetes Management (Signed)
Inpatient Diabetes Program Recommendations  AACE/ADA: New Consensus Statement on Inpatient Glycemic Control (2015)  Target Ranges:  Prepandial:   less than 140 mg/dL      Peak postprandial:   less than 180 mg/dL (1-2 hours)      Critically ill patients:  140 - 180 mg/dL   Lab Results  Component Value Date   GLUCAP 114 (H) 07/08/2022   HGBA1C 7.1 (H) 07/05/2022    Review of Glycemic Control  Diabetes history: DM2 Outpatient Diabetes medications: None Current orders for Inpatient glycemic control: Novolog 0-9 units TID with meals and 0-5 HS  HgbA1C - 7.1%  Inpatient Diabetes Program Recommendations:    F/U with PCP regarding diabetes management.   Spoke with pt at bedside prior to d/c. Pt has glucose meter at home, states he will start checking blood sugars "once in a while." Has not seen PCP in over 4 years, has appt with new PCP soon. On no DM meds at home. Discussed HgbA1C of 7.1%.  Answered questions.   Thank you. Ailene Ards, RD, LDN, CDCES Inpatient Diabetes Coordinator [phone]

## 2022-07-09 ENCOUNTER — Telehealth: Payer: Self-pay | Admitting: Cardiovascular Disease

## 2022-07-09 NOTE — Telephone Encounter (Signed)
Pt c/o medication issue:  1. Name of Medication: sacubitril-valsartan (ENTRESTO) 24-26 MG   2. How are you currently taking this medication (dosage and times per day)? Not currently taking   3. Are you having a reaction (difficulty breathing--STAT)? No   4. What is your medication issue? Patient's girlfriend is calling stating entresto is too expensive and is requesting a callback to discuss  alternatives. Based on research and speaking with a pharmacist she was told valsartan may be a possible option, but was advised to consult with cardiology. Please advise.

## 2022-07-09 NOTE — Telephone Encounter (Signed)
The patient has not been seen in office yet.  He was prescribed Entresto on hospital discharge and cannot afford the copay for 30 day supply which is over $200.  He has [facility].  He has not picked up the medication. I wanted to call the [facility] store to see if they can apply a copay card.  The patient asked me to call his spouse Savita back with the information.  [phone].   I called the [facility] store in [place].  Requested activation/application of 30 day free trial card: [id number] [facility] [phone] [phone]  [facility] and updated with this information.  She will come by our office tomorrow and pick up the $10 copay card.

## 2022-07-18 DIAGNOSIS — I5021 Acute systolic (congestive) heart failure: Secondary | ICD-10-CM | POA: Diagnosis not present

## 2022-07-18 DIAGNOSIS — F5101 Primary insomnia: Secondary | ICD-10-CM | POA: Diagnosis not present

## 2022-07-18 DIAGNOSIS — N182 Chronic kidney disease, stage 2 (mild): Secondary | ICD-10-CM | POA: Diagnosis not present

## 2022-07-18 DIAGNOSIS — E1122 Type 2 diabetes mellitus with diabetic chronic kidney disease: Secondary | ICD-10-CM | POA: Diagnosis not present

## 2022-07-19 ENCOUNTER — Ambulatory Visit: Payer: BC Managed Care – PPO | Admitting: Nurse Practitioner

## 2022-07-19 NOTE — Progress Notes (Signed)
HEART & VASCULAR TRANSITION OF CARE CONSULT NOTE     Referring Physician: Primary Care: Primary Cardiologist:  HPI: Referred to clinic by *** for heart failure consultation.   Jacob Donaldson is a 62 y.o. male with a hx of type 2 DM, PE no longer on AC, and new diagnosis of systolic heart failure and CAD.   Admitted 4/24 with new diagnosis of acute systolic heart failure. Diuresed with IV lasix. Echo showed EF 30-35%, RV ok. Coronary CT showed mild nonobstructive CAD. GDMT titrated, and he was discharged home., weight 278 lbs.    Cardiac Testing  - Coronary CT (4/24): mild nonobstructive CAD  - Echo (4/24): EF 30-35%, RV ok  Review of Systems: [y] = yes, [ ]  = no   General: Weight gain [ ] ; Weight loss [ ] ; Anorexia [ ] ; Fatigue [ ] ; Fever [ ] ; Chills [ ] ; Weakness [ ]   Cardiac: Chest pain/pressure [ ] ; Resting SOB [ ] ; Exertional SOB [ ] ; Orthopnea [ ] ; Pedal Edema [ ] ; Palpitations [ ] ; Syncope [ ] ; Presyncope [ ] ; Paroxysmal nocturnal dyspnea[ ]   Pulmonary: Cough [ ] ; Wheezing[ ] ; Hemoptysis[ ] ; Sputum [ ] ; Snoring [ ]   GI: Vomiting[ ] ; Dysphagia[ ] ; Melena[ ] ; Hematochezia [ ] ; Heartburn[ ] ; Abdominal pain [ ] ; Constipation [ ] ; Diarrhea [ ] ; BRBPR [ ]   GU: Hematuria[ ] ; Dysuria [ ] ; Nocturia[ ]   Vascular: Pain in legs with walking [ ] ; Pain in feet with lying flat [ ] ; Non-healing sores [ ] ; Stroke [ ] ; TIA [ ] ; Slurred speech [ ] ;  Neuro: Headaches[ ] ; Vertigo[ ] ; Seizures[ ] ; Paresthesias[ ] ;Blurred vision [ ] ; Diplopia [ ] ; Vision changes [ ]   Ortho/Skin: Arthritis [ ] ; Joint pain [ ] ; Muscle pain [ ] ; Joint swelling [ ] ; Back Pain [ ] ; Rash [ ]   Psych: Depression[ ] ; Anxiety[ ]   Heme: Bleeding problems [ ] ; Clotting disorders [ ] ; Anemia [ ]   Endocrine: Diabetes [place] ]; Thyroid dysfunction[ ]    Past Medical History:  Diagnosis Date   Acute CHF (congestive heart failure) (HCC) 07/04/2022   CKD (chronic kidney disease), stage II    Diabetes mellitus without  complication (HCC)    Pulmonary embolism (HCC)     Current Outpatient Medications  Medication Sig Dispense Refill   aspirin EC 81 MG tablet Take 81 mg by mouth 2 (two) times a week. Swallow whole.     metoprolol succinate (TOPROL-XL) 25 MG 24 hr tablet Take 1 tablet (25 mg total) by mouth daily. 30 tablet 0   sacubitril-valsartan (ENTRESTO) 24-26 MG Take 1 tablet by mouth 2 (two) times daily. 60 tablet 0   No current facility-administered medications for this visit.    No Active Allergies    Social History   Socioeconomic History   Marital status: Widowed    Spouse name: Not on file   Number of children: Not on file   Years of education: Not on file   Highest education level: Not on file  Occupational History   Not on file  Tobacco Use   Smoking status: Never   Smokeless tobacco: Never  Substance and Sexual Activity   Alcohol use: No   Drug use: No   Sexual activity: Not on file  Other Topics Concern   Not on file  Social History Narrative   Not on file   Social Determinants of Health   Financial Resource Strain: Not on file  Food Insecurity: No Food Insecurity (07/04/2022)  Hunger Vital Sign    Worried About Running Out of Food in the Last Year: Never true    Ran Out of Food in the Last Year: Never true  Transportation Needs: No Transportation Needs (07/04/2022)   PRAPARE - Administrator, Civil Service (Medical): No    Lack of Transportation (Non-Medical): No  Physical Activity: Not on file  Stress: Not on file  Social Connections: Not on file  Intimate Partner Violence: Not At Risk (07/04/2022)   Humiliation, Afraid, Rape, and Kick questionnaire    Fear of Current or Ex-Partner: No    Emotionally Abused: No    Physically Abused: No    Sexually Abused: No      Family History  Problem Relation Age of Onset   Heart disease Mother    Diabetes Mellitus II Maternal Grandmother     There were no vitals filed for this visit.  PHYSICAL  EXAM: General:  Well appearing. No respiratory difficulty HEENT: normal Neck: supple. no JVD. Carotids 2+ bilat; no bruits. No lymphadenopathy or thryomegaly appreciated. Cor: PMI nondisplaced. Regular rate & rhythm. No rubs, gallops or murmurs. Lungs: clear Abdomen: soft, nontender, nondistended. No hepatosplenomegaly. No bruits or masses. Good bowel sounds. Extremities: no cyanosis, clubbing, rash, edema Neuro: alert & oriented x 3, cranial nerves grossly intact. moves all 4 extremities w/o difficulty. Affect pleasant.  ECG:   ASSESSMENT & PLAN: Chronic Systolic Heart Failure - NYHA II, volume ok today - Continue Toprol XL 25 mg daily - Continue Entresto 24/26 mg bid - Labs today  2.  CKD 3  3. DM2 - A1C 7.3  4. SDOH - He is uninsured. Engage HFSW for resources   NYHA *** GDMT  Diuretic- BB- Ace/ARB/ARNI MRA SGLT2i   Referred to HFSW (PCP, Medications, Transportation, ETOH Abuse, Drug Abuse, Insurance, Financial ): Yes or No Refer to Pharmacy: Yes or No Refer to Home Health: Yes on No Refer to Advanced Heart Failure Clinic: Yes or no  Refer to General Cardiology: Yes or No  Follow up

## 2022-07-22 ENCOUNTER — Ambulatory Visit (HOSPITAL_COMMUNITY)
Admit: 2022-07-22 | Discharge: 2022-07-22 | Disposition: A | Discharge status: Home / Self Care | Payer: BC Managed Care – PPO | Attending: Family Medicine | Admitting: Family Medicine

## 2022-07-22 ENCOUNTER — Encounter (HOSPITAL_COMMUNITY): Payer: Self-pay

## 2022-07-22 ENCOUNTER — Other Ambulatory Visit (HOSPITAL_COMMUNITY): Payer: Self-pay

## 2022-07-22 VITALS — BP 160/92 | HR 76 | Wt 285.6 lb

## 2022-07-22 DIAGNOSIS — R0683 Snoring: Secondary | ICD-10-CM | POA: Insufficient documentation

## 2022-07-22 DIAGNOSIS — Z86711 Personal history of pulmonary embolism: Secondary | ICD-10-CM | POA: Diagnosis not present

## 2022-07-22 DIAGNOSIS — I2510 Atherosclerotic heart disease of native coronary artery without angina pectoris: Secondary | ICD-10-CM

## 2022-07-22 DIAGNOSIS — I130 Hypertensive heart and chronic kidney disease with heart failure and stage 1 through stage 4 chronic kidney disease, or unspecified chronic kidney disease: Secondary | ICD-10-CM | POA: Diagnosis not present

## 2022-07-22 DIAGNOSIS — I428 Other cardiomyopathies: Secondary | ICD-10-CM | POA: Diagnosis not present

## 2022-07-22 DIAGNOSIS — I5022 Chronic systolic (congestive) heart failure: Secondary | ICD-10-CM | POA: Insufficient documentation

## 2022-07-22 DIAGNOSIS — I10 Essential (primary) hypertension: Secondary | ICD-10-CM | POA: Diagnosis not present

## 2022-07-22 DIAGNOSIS — Z79899 Other long term (current) drug therapy: Secondary | ICD-10-CM | POA: Insufficient documentation

## 2022-07-22 DIAGNOSIS — N183 Chronic kidney disease, stage 3 unspecified: Secondary | ICD-10-CM

## 2022-07-22 DIAGNOSIS — E1122 Type 2 diabetes mellitus with diabetic chronic kidney disease: Secondary | ICD-10-CM | POA: Diagnosis not present

## 2022-07-22 DIAGNOSIS — Z139 Encounter for screening, unspecified: Secondary | ICD-10-CM

## 2022-07-22 LAB — BASIC METABOLIC PANEL
Anion gap: 10 (ref 5–15)
BUN: 14 mg/dL (ref 6–20)
CO2: 23 mmol/L (ref 22–32)
Calcium: 8.7 mg/dL — ABNORMAL LOW (ref 8.9–10.3)
Chloride: 106 mmol/L (ref 98–111)
Creatinine, Ser: 1.4 mg/dL — ABNORMAL HIGH (ref 0.61–1.24)
GFR, Estimated: 58 mL/min — ABNORMAL LOW (ref >60)
Glucose, Bld: 104 mg/dL — ABNORMAL HIGH (ref 70–99)
Potassium: 4 mmol/L (ref 3.5–5.1)
Sodium: 139 mmol/L (ref 135–145)

## 2022-07-22 LAB — CBC
HCT: 45.8 % (ref 39.0–52.0)
Hemoglobin: 14.8 g/dL (ref 13.0–17.0)
MCH: 31 pg (ref 26.0–34.0)
MCHC: 32.3 g/dL (ref 30.0–36.0)
MCV: 95.8 fL (ref 80.0–100.0)
Platelets: 167 10*3/uL (ref 150–400)
RBC: 4.78 MIL/uL (ref 4.22–5.81)
RDW: 13.5 % (ref 11.5–15.5)
WBC: 8.5 10*3/uL (ref 4.0–10.5)
nRBC: 0 % (ref 0.0–0.2)

## 2022-07-22 LAB — BRAIN NATRIURETIC PEPTIDE: B Natriuretic Peptide: 427.2 pg/mL — ABNORMAL HIGH (ref 0.0–100.0)

## 2022-07-22 MED ORDER — EMPAGLIFLOZIN 10 MG PO TABS
10.0000 mg | ORAL_TABLET | Freq: Every day | ORAL | 6 refills | Status: DC
Start: 2022-07-22 — End: 2022-12-20

## 2022-07-22 NOTE — Patient Instructions (Addendum)
Start Jardiance 10 mg daily - Rx sent to local pharmacy. Labs drawn today - will call you if abnormal.  BP cuff Rx provided for home use. Cardiac MRI - will obtain prior auth and call you with appointment time and date.  Return to Heart Failure APP clinic in 3 - 4 weeks - see below. Return to Heart Failure Clinic to see Dr. Gasper Lloyd with echo in 3 months - will call you with appointment. If you do not hear from [place], please call us in July to schedule this appointment.  Please call us at [phone] if any questions or concerns before you next appointment.

## 2022-07-24 NOTE — Progress Notes (Deleted)
Office Visit    Patient Name: Jacob Donaldson Date of Encounter: 07/24/2022  Primary Care Provider:  Georgianne Fick, MD Primary Cardiologist:  Verne Carrow, MD Primary Electrophysiologist: None   Past Medical History    Past Medical History:  Diagnosis Date   Acute CHF (congestive heart failure) (HCC) 07/04/2022   CKD (chronic kidney disease), stage II    Diabetes mellitus without complication (HCC)    Pulmonary embolism (HCC)    No past surgical history on file.  Allergies  No Active Allergies   History of Present Illness    Jacob Donaldson  is a 62 year old male with a PMH of HFrEF, nonobstructive CAD, NICM, unprovoked PE (no longer on AC), DM type II, FH of premature CAD who presents today for post hospital follow-up.  Jacob Donaldson was seen on 07/04/2022 in the ED at [facility] for complaint of shortness of breath with orthopnea.  He reported symptoms ongoing for a while but became progressively worse.  BNP was obtained and elevated at 596.  Chest x-ray was completed showing small bilateral pleural effusions and 2D echo completed with EF of 30-35% with normal RV systolic function and mild elevated PAS pressure, with global hypokinesis in the anterior/anterior septal/inferior septal wall.  He was started on IV Lasix and diuresed with slow improvement.  He underwent a coronary CT scan that showed mild nonobstructive CAD with nonischemic cardiomyopathy noted.  He was discharged with GDMT titrated for effective management.  He was seen in the heart failure clinic for [facility] visit on 07/22/2022 and was feeling well overall with no dyspnea while walking but endorsed difficulty taking deep breaths.  He was started on Jardiance 10 mg with plan to add spironolactone at follow-up.  He was scheduled for a CV MRI to evaluate for infiltrative cardiomyopathy and will have repeat 2D echo in 3 months once GDMT is optimized.   Since last being seen in the office patient reports***.  Patient  denies chest pain, palpitations, dyspnea, PND, orthopnea, nausea, vomiting, dizziness, syncope, edema, weight gain, or early satiety.     ***Notes: -Sleep study Home Medications    Current Outpatient Medications  Medication Sig Dispense Refill   aspirin EC 81 MG tablet Take 81 mg by mouth 2 (two) times a week. Swallow whole.     empagliflozin (JARDIANCE) 10 MG TABS tablet Take 1 tablet (10 mg total) by mouth daily before breakfast. 30 tablet 6   metoprolol succinate (TOPROL-XL) 25 MG 24 hr tablet Take 1 tablet (25 mg total) by mouth daily. 30 tablet 0   sacubitril-valsartan (ENTRESTO) 24-26 MG Take 1 tablet by mouth 2 (two) times daily. 60 tablet 0   No current facility-administered medications for this visit.     Review of Systems  Please see the history of present illness.    (+)*** (+)***  All other systems reviewed and are otherwise negative except as noted above.  Physical Exam    Wt Readings from Last 3 Encounters:  07/22/22 285 lb 9.6 oz (129.5 kg)  07/08/22 278 lb 8 oz (126.3 kg)  02/23/19 (!) 304 lb 3.8 oz (138 kg)   ZO:XWRUE were no vitals filed for this visit.,There is no height or weight on file to calculate BMI.  Constitutional:      Appearance: Healthy appearance. Not in distress.  Neck:     Vascular: JVD normal.  Pulmonary:     Effort: Pulmonary effort is normal.     Breath sounds: No wheezing.  No rales. Diminished in the bases Cardiovascular:     Normal rate. Regular rhythm. Normal S1. Normal S2.      Murmurs: There is no murmur.  Edema:    Peripheral edema absent.  Abdominal:     Palpations: Abdomen is soft non tender. There is no hepatomegaly.  Skin:    General: Skin is warm and dry.  Neurological:     General: No focal deficit present.     Mental Status: Alert and oriented to person, place and time.     Cranial Nerves: Cranial nerves are intact.  EKG/LABS/ Recent Cardiac Studies    ECG personally reviewed by me today - ***  Cardiac Studies  & Procedures       ECHOCARDIOGRAM  ECHOCARDIOGRAM COMPLETE 07/05/2022  Narrative ECHOCARDIOGRAM REPORT    Patient Name:   Jacob Donaldson Date of Exam: 07/05/2022 Medical Rec #:  [id number]     Height:       79.0 in Accession #:    [id number]    Weight:       281.7 lb Date of Birth:  03/31/1960     BSA:          2.636 m Patient Age:    61 years      BP:           145/97 mmHg Patient Gender: M             HR:           82 bpm. Exam Location:  Inpatient  Procedure: 2D Echo, Cardiac Doppler, Color Doppler and Intracardiac Opacification Agent  Indications:    CHF  History:        Patient has no prior history of Echocardiogram examinations. Signs/Symptoms:Dyspnea; Risk Factors:Diabetes.  Sonographer:    Milbert Coulter Referring Phys: [id number] VISHAL R PATEL  IMPRESSIONS   1. Challenging windows. global hypokinesis, appears worse in the anterior/anteroseptal/inferoseptal wall from base to the apex. Left ventricular ejection fraction, by estimation, is 30 to 35%. The left ventricle has moderately decreased function. There is mild left ventricular hypertrophy. Left ventricular diastolic parameters are indeterminate. 2. Right ventricular systolic function is normal. The right ventricular size is normal. There is mildly elevated pulmonary artery systolic pressure. 3. No evidence of mitral valve regurgitation. 4. The aortic valve was not well visualized. Aortic valve regurgitation is not visualized. 5. Aortic not well visualized. 6. The inferior vena cava is normal in size with <50% respiratory variability, suggesting right atrial pressure of 8 mmHg.  Comparison(s): No prior Echocardiogram.  FINDINGS Left Ventricle: Challenging windows. global hypokinesis, appears worse in the anterior/anteroseptal/inferoseptal wall from base to the apex. Left ventricular ejection fraction, by estimation, is 30 to 35%. The left ventricle has moderately decreased function. Definity contrast agent was given  IV to delineate the left ventricular endocardial borders. The left ventricular internal cavity size was normal in size. There is mild left ventricular hypertrophy. Left ventricular diastolic parameters are indeterminate.  Right Ventricle: The right ventricular size is normal. Right ventricular systolic function is normal. There is mildly elevated pulmonary artery systolic pressure. The tricuspid regurgitant velocity is 2.89 m/s, and with an assumed right atrial pressure of 8 mmHg, the estimated right ventricular systolic pressure is 41.4 mmHg.  Left Atrium: Left atrial size was normal in size.  Right Atrium: Right atrial size was normal in size.  Pericardium: There is no evidence of pericardial effusion.  Mitral Valve: No evidence of mitral valve regurgitation.  Tricuspid Valve: Tricuspid valve regurgitation  is mild.  Aortic Valve: The aortic valve was not well visualized. Aortic valve regurgitation is not visualized.  Pulmonic Valve: Pulmonic valve regurgitation is not visualized.  Aorta: Not well visualized.  Venous: The inferior vena cava is normal in size with less than 50% respiratory variability, suggesting right atrial pressure of 8 mmHg.  IAS/Shunts: The interatrial septum was not well visualized.   LEFT VENTRICLE PLAX 2D LVIDd:         6.30 cm      Diastology LVIDs:         5.60 cm      LV e' medial:    6.20 cm/s LV PW:         1.40 cm      LV E/e' medial:  14.6 LV IVS:        1.30 cm      LV e' lateral:   10.10 cm/s LVOT diam:     2.80 cm      LV E/e' lateral: 9.0 LVOT Area:     6.16 cm  LV Volumes (MOD) LV vol d, MOD A2C: 103.0 ml LV vol d, MOD A4C: 167.0 ml LV vol s, MOD A2C: 84.7 ml LV vol s, MOD A4C: 113.0 ml LV SV MOD A2C:     18.3 ml LV SV MOD A4C:     167.0 ml LV SV MOD BP:      42.2 ml  RIGHT VENTRICLE RV Basal diam:  2.80 cm RV Mid diam:    1.90 cm RV S prime:     11.10 cm/s TAPSE (M-mode): 2.2 cm  LEFT ATRIUM              Index        RIGHT  ATRIUM           Index LA Vol (A2C):   113.0 ml 42.86 ml/m  RA Area:     22.60 cm LA Vol (A4C):   84.3 ml  31.98 ml/m  RA Volume:   69.00 ml  26.17 ml/m LA Biplane Vol: 101.0 ml 38.31 ml/m MITRAL VALVE               TRICUSPID VALVE MV Area (PHT): 4.80 cm    TR Peak grad:   33.4 mmHg MV Decel Time: 158 msec    TR Vmax:        289.00 cm/s MV E velocity: 90.40 cm/s MV A velocity: 29.10 cm/s  SHUNTS MV E/A ratio:  3.11        Systemic Diam: 2.80 cm  Carolan Clines Electronically signed by Carolan Clines Signature Date/Time: 07/05/2022/10:22:30 AM    Final     CT SCANS  CT CORONARY MORPH W/CTA COR W/SCORE 07/08/2022  Addendum 07/08/2022  3:36 PM ADDENDUM REPORT: 07/08/2022 15:34  EXAM: OVER-READ INTERPRETATION  CT CHEST  The following report is an over-read performed by radiologist Dr. Carola Frost [facility] on 07/08/2022. This over-read does not include interpretation of cardiac or coronary anatomy or pathology. The coronary CTA interpretation by the cardiologist is attached.  COMPARISON:  CT angiogram chest 07/04/2022  FINDINGS: There are small bilateral pleural effusions unchanged from prior. The visualized portions of the lungs now appear clear. No mediastinal adenopathy. Well-circumscribed cystic area adjacent to the anterior inferior margin of the heart measures 9.1 x 3.5 x 4.2 cm and appears unchanged from prior exam. No acute osseous abnormalities. There is a 4.3 cm cyst is partially imaged in the left kidney.  IMPRESSION: 1. Small  bilateral pleural effusions unchanged from prior exam. 2. 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   Electronically Signed By: Darliss Cheney M.D. On: 07/08/2022 15:34  Narrative CLINICAL DATA:  29M with acute systolic heart failure  EXAM: Cardiac/Coronary CTA  TECHNIQUE: The  patient was scanned on a [employer].  FINDINGS: A 100 kV prospective scan was triggered in the descending thoracic aorta at 111 HU's. Axial non-contrast 3 mm slices were carried out through the heart. The data set was analyzed on a dedicated work station and scored using the Agatson method. Gantry rotation speed was 250 msecs and collimation was .6 mm. No beta blockade and 0.8 mg of sl NTG was given. The 3D data set was reconstructed in 5% intervals of the 35-75% of the R-R cycle. Phases were analyzed on a dedicated work station using MPR, MIP and VRT modes. The patient received 100 cc of contrast.  Coronary Arteries:  Normal coronary origin.  Right dominance.  RCA is a large dominant artery that gives rise to PDA and PLA. Noncalcified plaque in mid RCA causes 0-24% stenosis  Left main is a large artery that gives rise to LAD and LCX arteries.  LAD is a large vessel. Noncalcified plaque in proximal LAD causes 0-24% stenosis. Calcified plaque in distal LAD causes 25-49% stenosis. Noncalcified plaque in ostial D1 causes 0-24% stenosis  LCX is a non-dominant artery that gives rise to one large OM1 branch. Noncalcified plaque in proximal LCX causes 0-24% stenosis  Noncalcified plaque in ostial ramus causes 0-24% stenosis  Other findings:  Left Ventricle: Mild dilatation  Left Atrium: Moderate enlargement  Pulmonary Veins: Normal configuration  Right Ventricle: Mild dilatation  Right Atrium: Mild enlargement  Cardiac valves: No calcifications  Thoracic aorta: Dilated ascending aorta measuring 41mm  Pulmonary Arteries: Normal size  Systemic Veins: Normal drainage  Pericardium: Normal thickness  IMPRESSION: 1. Coronary calcium score of 12. This was 66th percentile for age and sex matched control.  2.  Normal coronary origin with right dominance.  3.  Nonobstructive CAD.  4.  Mild (25-49%) stenosis in distal LAD  5. Minimal (0-24%) stenosis in proximal  LAD, ostial D1, ostial ramus, proximal LCX, and mid RCA  6.  Dilated ascending aorta measuring 41mm  CAD-RADS 2. Mild non-obstructive CAD (25-49%). Consider non-atherosclerotic causes of chest pain. Consider preventive therapy and risk factor modification.  Electronically Signed: By: Epifanio Lesches M.D. On: 07/08/2022 14:44          Risk Assessment/Calculations:   {Does this patient have ATRIAL FIBRILLATION?:[id number]}        Lab Results  Component Value Date   WBC 8.5 07/22/2022   HGB 14.8 07/22/2022   HCT 45.8 07/22/2022   MCV 95.8 07/22/2022   PLT 167 07/22/2022   Lab Results  Component Value Date   CREATININE 1.40 (H) 07/22/2022   BUN 14 07/22/2022   NA 139 07/22/2022   K 4.0 07/22/2022   CL 106 07/22/2022   CO2 23 07/22/2022   Lab Results  Component Value Date   ALT 18 07/04/2022   AST 36 07/04/2022   ALKPHOS 76 07/04/2022   BILITOT 0.6 07/04/2022   Lab Results  Component Value Date   CHOL 139 07/06/2022   HDL 25 (L) 07/06/2022   LDLCALC 97 07/06/2022   TRIG 87 07/06/2022   CHOLHDL 5.6 07/06/2022  Lab Results  Component Value Date   HGBA1C 7.1 (H) 07/05/2022     Assessment & Plan    1.  HFrEF: -2D echo completed 4/thousand 24 with EF of 30-35% with normal RV function.  Patient currently followed in the advanced CHF clinic -Today patient reports*** -Continue current GDMT per CHF clinic with***  2.  NICM: -Patient underwent cardiac CT that showed mild nonobstructive CAD -Today patient reports*** -He is scheduled to undergo cardiac MRA to rule out infiltrative CM -Continue***  3.  Nonobstructive CAD: -Mild disease seen on coronary CT -Today patient reports*** -Continue GDMT with***  4.  DM type II: -Patient's last hemoglobin A1c was 7.1 -Continue Jardiance***  5.  Sleep disturbance:       Disposition: Follow-up with Verne Carrow, MD or APP in *** months {Are you ordering a CV Procedure (e.g. stress test, cath,  DCCV, TEE, etc)?   Press F2        :[id number]}   Medication Adjustments/Labs and Tests Ordered: Current medicines are reviewed at length with the patient today.  Concerns regarding medicines are outlined above.   Signed, Napoleon Form, Leodis Rains, NP 07/24/2022, 10:48 AM [facility]

## 2022-07-25 ENCOUNTER — Ambulatory Visit: Payer: No Typology Code available for payment source | Admitting: Nurse Practitioner

## 2022-07-25 DIAGNOSIS — I5020 Unspecified systolic (congestive) heart failure: Secondary | ICD-10-CM

## 2022-07-25 DIAGNOSIS — G479 Sleep disorder, unspecified: Secondary | ICD-10-CM

## 2022-07-25 DIAGNOSIS — E1110 Type 2 diabetes mellitus with ketoacidosis without coma: Secondary | ICD-10-CM

## 2022-07-25 DIAGNOSIS — I428 Other cardiomyopathies: Secondary | ICD-10-CM

## 2022-07-25 DIAGNOSIS — I2510 Atherosclerotic heart disease of native coronary artery without angina pectoris: Secondary | ICD-10-CM

## 2022-08-08 NOTE — Progress Notes (Signed)
Office Visit    Patient Name: Jacob Donaldson Date of Encounter: 08/09/2022  Primary Care Provider:  Georgianne Fick, MD Primary Cardiologist:  Verne Carrow, MD Primary Electrophysiologist: None   Past Medical History    Past Medical History:  Diagnosis Date   Acute CHF (congestive heart failure) (HCC) 07/04/2022   CKD (chronic kidney disease), stage II    Diabetes mellitus without complication (HCC)    Pulmonary embolism (HCC)    No past surgical history on file.  Allergies  No Active Allergies   History of Present Illness   Jacob Donaldson  is a 62 year old male with a PMH of HFrEF, nonobstructive CAD, NICM, unprovoked PE (no longer on AC), DM type II, FH of premature CAD who presents today for post hospital follow-up.   Jacob Donaldson was seen on 07/04/2022 in the ED at [facility] for complaint of shortness of breath with orthopnea. He reported symptoms ongoing for a while but became progressively worse. BNP was obtained and elevated at 596. Chest x-ray was completed showing small bilateral pleural effusions and 2D echo completed with EF of 30-35% with normal RV systolic function and mild elevated PAS pressure, with global hypokinesis in the anterior/anterior septal/inferior septal wall.  He was started on IV Lasix and diuresed with slow improvement.  He underwent a coronary CT scan that showed mild nonobstructive CAD with nonischemic cardiomyopathy noted.  He was discharged with GDMT titrated for effective management.  He was seen in the heart failure clinic for [facility] visit on 07/22/2022 and was feeling well overall with no dyspnea while walking but endorsed difficulty taking deep breaths.  He was started on Jardiance 10 mg with plan to add spironolactone at follow-up.  He was scheduled for a CV MRI to evaluate for infiltrative cardiomyopathy and will have repeat 2D echo in 3 months once GDMT is optimized.   Jacob Donaldson presents today for posthospital follow-up visit.  Since  last being seen in the office patient reports that he has been doing well since being seen his admission and reports feeling much better with his new medications.  His blood pressure today was initially elevated at 138/90 and was 122/82 on recheck.  He has been watching his salt intake but is struggling with drinking water.  He mostly drinks tea and sodas and does not like the taste of water.  He is euvolemic on exam and weights have been stable since his previous office visits.  He continues to experience mild fluttering and palpitations.  He is also having difficulty with sleeping since his wife passed away.  He is unsure if he snores and has never been told in the past.  Patient denies chest pain, palpitations, dyspnea, PND, orthopnea, nausea, vomiting, dizziness, syncope, edema, weight gain, or early satiety.   Home Medications    Current Outpatient Medications  Medication Sig Dispense Refill   aspirin EC 81 MG tablet Take 81 mg by mouth 2 (two) times a week. Swallow whole.     empagliflozin (JARDIANCE) 10 MG TABS tablet Take 1 tablet (10 mg total) by mouth daily before breakfast. 30 tablet 6   metoprolol succinate (TOPROL-XL) 25 MG 24 hr tablet Take 1 tablet (25 mg total) by mouth daily. 30 tablet 0   sacubitril-valsartan (ENTRESTO) 24-26 MG Take 1 tablet by mouth 2 (two) times daily. 60 tablet 0   No current facility-administered medications for this visit.     Review of Systems  Please see the history of  present illness.    (+) Palpitations (+) Insomnia  All other systems reviewed and are otherwise negative except as noted above.  Physical Exam    Wt Readings from Last 3 Encounters:  08/09/22 281 lb 6.4 oz (127.6 kg)  07/22/22 285 lb 9.6 oz (129.5 kg)  07/08/22 278 lb 8 oz (126.3 kg)   VS: Vitals:   08/09/22 1420 08/09/22 1505  BP: (!) 138/90 122/82  Pulse: 76   SpO2: 98%   ,Body mass index is 31.7 kg/m.  Constitutional:      Appearance: Healthy appearance. Not in  distress.  Neck:     Vascular: JVD normal.  Pulmonary:     Effort: Pulmonary effort is normal.     Breath sounds: No wheezing. No rales. Diminished in the bases Cardiovascular:     Normal rate. Regular rhythm. Normal S1. Normal S2.      Murmurs: There is no murmur.  Edema:    Peripheral edema absent.  Abdominal:     Palpations: Abdomen is soft non tender. There is no hepatomegaly.  Skin:    General: Skin is warm and dry.  Neurological:     General: No focal deficit present.     Mental Status: Alert and oriented to person, place and time.     Cranial Nerves: Cranial nerves are intact.  EKG/LABS/ Recent Cardiac Studies    ECG personally reviewed by me today -none completed today  Cardiac Studies & Procedures       ECHOCARDIOGRAM  ECHOCARDIOGRAM COMPLETE 07/05/2022  Narrative ECHOCARDIOGRAM REPORT    Patient Name:   Jacob Donaldson Date of Exam: 07/05/2022 Medical Rec #:  [id number]     Height:       79.0 in Accession #:    [id number]    Weight:       281.7 lb Date of Birth:  Oct 25, 1960     BSA:          2.636 m Patient Age:    61 years      BP:           145/97 mmHg Patient Gender: M             HR:           82 bpm. Exam Location:  Inpatient  Procedure: 2D Echo, Cardiac Doppler, Color Doppler and Intracardiac Opacification Agent  Indications:    CHF  History:        Patient has no prior history of Echocardiogram examinations. Signs/Symptoms:Dyspnea; Risk Factors:Diabetes.  Sonographer:    Milbert Coulter Referring Phys: [id number] VISHAL R PATEL  IMPRESSIONS   1. Challenging windows. global hypokinesis, appears worse in the anterior/anteroseptal/inferoseptal wall from base to the apex. Left ventricular ejection fraction, by estimation, is 30 to 35%. The left ventricle has moderately decreased function. There is mild left ventricular hypertrophy. Left ventricular diastolic parameters are indeterminate. 2. Right ventricular systolic function is normal. The right  ventricular size is normal. There is mildly elevated pulmonary artery systolic pressure. 3. No evidence of mitral valve regurgitation. 4. The aortic valve was not well visualized. Aortic valve regurgitation is not visualized. 5. Aortic not well visualized. 6. The inferior vena cava is normal in size with <50% respiratory variability, suggesting right atrial pressure of 8 mmHg.  Comparison(s): No prior Echocardiogram.  FINDINGS Left Ventricle: Challenging windows. global hypokinesis, appears worse in the anterior/anteroseptal/inferoseptal wall from base to the apex. Left ventricular ejection fraction, by estimation, is 30 to 35%. The left ventricle has moderately  decreased function. Definity contrast agent was given IV to delineate the left ventricular endocardial borders. The left ventricular internal cavity size was normal in size. There is mild left ventricular hypertrophy. Left ventricular diastolic parameters are indeterminate.  Right Ventricle: The right ventricular size is normal. Right ventricular systolic function is normal. There is mildly elevated pulmonary artery systolic pressure. The tricuspid regurgitant velocity is 2.89 m/s, and with an assumed right atrial pressure of 8 mmHg, the estimated right ventricular systolic pressure is 41.4 mmHg.  Left Atrium: Left atrial size was normal in size.  Right Atrium: Right atrial size was normal in size.  Pericardium: There is no evidence of pericardial effusion.  Mitral Valve: No evidence of mitral valve regurgitation.  Tricuspid Valve: Tricuspid valve regurgitation is mild.  Aortic Valve: The aortic valve was not well visualized. Aortic valve regurgitation is not visualized.  Pulmonic Valve: Pulmonic valve regurgitation is not visualized.  Aorta: Not well visualized.  Venous: The inferior vena cava is normal in size with less than 50% respiratory variability, suggesting right atrial pressure of 8 mmHg.  IAS/Shunts: The  interatrial septum was not well visualized.   LEFT VENTRICLE PLAX 2D LVIDd:         6.30 cm      Diastology LVIDs:         5.60 cm      LV e' medial:    6.20 cm/s LV PW:         1.40 cm      LV E/e' medial:  14.6 LV IVS:        1.30 cm      LV e' lateral:   10.10 cm/s LVOT diam:     2.80 cm      LV E/e' lateral: 9.0 LVOT Area:     6.16 cm  LV Volumes (MOD) LV vol d, MOD A2C: 103.0 ml LV vol d, MOD A4C: 167.0 ml LV vol s, MOD A2C: 84.7 ml LV vol s, MOD A4C: 113.0 ml LV SV MOD A2C:     18.3 ml LV SV MOD A4C:     167.0 ml LV SV MOD BP:      42.2 ml  RIGHT VENTRICLE RV Basal diam:  2.80 cm RV Mid diam:    1.90 cm RV S prime:     11.10 cm/s TAPSE (M-mode): 2.2 cm  LEFT ATRIUM              Index        RIGHT ATRIUM           Index LA Vol (A2C):   113.0 ml 42.86 ml/m  RA Area:     22.60 cm LA Vol (A4C):   84.3 ml  31.98 ml/m  RA Volume:   69.00 ml  26.17 ml/m LA Biplane Vol: 101.0 ml 38.31 ml/m MITRAL VALVE               TRICUSPID VALVE MV Area (PHT): 4.80 cm    TR Peak grad:   33.4 mmHg MV Decel Time: 158 msec    TR Vmax:        289.00 cm/s MV E velocity: 90.40 cm/s MV A velocity: 29.10 cm/s  SHUNTS MV E/A ratio:  3.11        Systemic Diam: 2.80 cm  Carolan Clines Electronically signed by Carolan Clines Signature Date/Time: 07/05/2022/10:22:30 AM    Final     CT SCANS  CT CORONARY MORPH W/CTA COR W/SCORE 07/08/2022  Addendum 07/08/2022  3:36 PM ADDENDUM REPORT: 07/08/2022 15:34  EXAM: OVER-READ INTERPRETATION  CT CHEST  The following report is an over-read performed by radiologist Dr. Carola Frost [facility] on 07/08/2022. This over-read does not include interpretation of cardiac or coronary anatomy or pathology. The coronary CTA interpretation by the cardiologist is attached.  COMPARISON:  CT angiogram chest 07/04/2022  FINDINGS: There are small bilateral pleural effusions unchanged from prior. The visualized portions of the lungs now appear  clear. No mediastinal adenopathy. Well-circumscribed cystic area adjacent to the anterior inferior margin of the heart measures 9.1 x 3.5 x 4.2 cm and appears unchanged from prior exam. No acute osseous abnormalities. There is a 4.3 cm cyst is partially imaged in the left kidney.  IMPRESSION: 1. Small bilateral pleural effusions unchanged from prior exam. 2. 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   Electronically Signed By: Darliss Cheney M.D. On: 07/08/2022 15:34  Narrative CLINICAL DATA:  61M with acute systolic heart failure  EXAM: Cardiac/Coronary CTA  TECHNIQUE: The patient was scanned on a [employer].  FINDINGS: A 100 kV prospective scan was triggered in the descending thoracic aorta at 111 HU's. Axial non-contrast 3 mm slices were carried out through the heart. The data set was analyzed on a dedicated work station and scored using the Agatson method. Gantry rotation speed was 250 msecs and collimation was .6 mm. No beta blockade and 0.8 mg of sl NTG was given. The 3D data set was reconstructed in 5% intervals of the 35-75% of the R-R cycle. Phases were analyzed on a dedicated work station using MPR, MIP and VRT modes. The patient received 100 cc of contrast.  Coronary Arteries:  Normal coronary origin.  Right dominance.  RCA is a large dominant artery that gives rise to PDA and PLA. Noncalcified plaque in mid RCA causes 0-24% stenosis  Left main is a large artery that gives rise to LAD and LCX arteries.  LAD is a large vessel. Noncalcified plaque in proximal LAD causes 0-24% stenosis. Calcified plaque in distal LAD causes 25-49% stenosis. Noncalcified plaque in ostial D1 causes 0-24% stenosis  LCX is a non-dominant artery that gives rise to one large OM1 branch. Noncalcified plaque in proximal LCX causes 0-24%  stenosis  Noncalcified plaque in ostial ramus causes 0-24% stenosis  Other findings:  Left Ventricle: Mild dilatation  Left Atrium: Moderate enlargement  Pulmonary Veins: Normal configuration  Right Ventricle: Mild dilatation  Right Atrium: Mild enlargement  Cardiac valves: No calcifications  Thoracic aorta: Dilated ascending aorta measuring 41mm  Pulmonary Arteries: Normal size  Systemic Veins: Normal drainage  Pericardium: Normal thickness  IMPRESSION: 1. Coronary calcium score of 12. This was 66th percentile for age and sex matched control.  2.  Normal coronary origin with right dominance.  3.  Nonobstructive CAD.  4.  Mild (25-49%) stenosis in distal LAD  5. Minimal (0-24%) stenosis in proximal LAD, ostial D1, ostial ramus, proximal LCX, and mid RCA  6.  Dilated ascending aorta measuring 41mm  CAD-RADS 2. Mild non-obstructive CAD (25-49%). Consider non-atherosclerotic causes of chest pain. Consider preventive therapy and risk factor modification.  Electronically Signed: By: Epifanio Lesches M.D. On: 07/08/2022 14:44          STOP-Bang Score:  4        Lab Results  Component Value Date   WBC 8.5 07/22/2022  HGB 14.8 07/22/2022   HCT 45.8 07/22/2022   MCV 95.8 07/22/2022   PLT 167 07/22/2022   Lab Results  Component Value Date   CREATININE 1.40 (H) 07/22/2022   BUN 14 07/22/2022   NA 139 07/22/2022   K 4.0 07/22/2022   CL 106 07/22/2022   CO2 23 07/22/2022   Lab Results  Component Value Date   ALT 18 07/04/2022   AST 36 07/04/2022   ALKPHOS 76 07/04/2022   BILITOT 0.6 07/04/2022   Lab Results  Component Value Date   CHOL 139 07/06/2022   HDL 25 (L) 07/06/2022   LDLCALC 97 07/06/2022   TRIG 87 07/06/2022   CHOLHDL 5.6 07/06/2022    Lab Results  Component Value Date   HGBA1C 7.1 (H) 07/05/2022     Assessment & Plan    1.  HFrEF: -2D echo completed 06/2022 with EF of 30-35% with normal RV function.  Patient currently  followed in the advanced CHF clinic -Today patient reports that he is no longer experiencing any fatigue or shortness of breath.  He is able to lie flat when sleeping and denies any medication side effects. -He is scheduled to follow-up with the heart failure clinic next week -Continue current GDMT with Jardiance 10 mg, Toprol 25 mg, Entresto 24/26 mg -Low sodium diet, fluid restriction <2L, and daily weights encouraged. Educated to contact our office for weight gain of 2 lbs overnight or 5 lbs in one week.   2.  NICM: -Patient underwent cardiac CT that showed mild nonobstructive CAD -Today patient reports that he is experiencing any shortness of breath -He is scheduled to undergo cardiac MRA to rule out infiltrative CM -Continue GDMT as noted above   3.  Nonobstructive CAD: -Mild disease seen on coronary CT -He reports no cardiac symptoms today -Continue GDMT with ASA 81 mg -We will plan for patient to have fasting lipids and LFTs checked   4.  DM type II: -Patient's last hemoglobin A1c was 7.1 -Continue Jardiance 10 mg -Patient was advised to decrease his intake of sugary beverages and increase his physical activity as tolerated  5.  Sleep disturbance: -Patient reports bouts of insomnia and has a STOP-BANG of 4 -He will complete a Itamar home sleep study for evaluation of possible sleep apnea  6.  Palpitations: -Patient reports complaints of palpitations that occur occasionally and are not sustained. -We will have him wear a 14-day ZIO monitor to rule out sustained arrhythmias  Disposition: Follow-up with Verne Carrow, MD or APP in 3 months    Medication Adjustments/Labs and Tests Ordered: Current medicines are reviewed at length with the patient today.  Concerns regarding medicines are outlined above.   Signed, Napoleon Form, Leodis Rains, NP 08/09/2022, 5:07 PM [facility]

## 2022-08-09 ENCOUNTER — Ambulatory Visit (INDEPENDENT_AMBULATORY_CARE_PROVIDER_SITE_OTHER): Payer: BC Managed Care – PPO

## 2022-08-09 ENCOUNTER — Ambulatory Visit: Payer: BC Managed Care – PPO | Attending: Nurse Practitioner | Admitting: Nurse Practitioner

## 2022-08-09 ENCOUNTER — Encounter: Payer: Self-pay | Admitting: Nurse Practitioner

## 2022-08-09 ENCOUNTER — Telehealth: Payer: Self-pay | Admitting: *Deleted

## 2022-08-09 VITALS — BP 122/82 | HR 76 | Ht 79.0 in | Wt 281.4 lb

## 2022-08-09 DIAGNOSIS — I5020 Unspecified systolic (congestive) heart failure: Secondary | ICD-10-CM | POA: Diagnosis not present

## 2022-08-09 DIAGNOSIS — E118 Type 2 diabetes mellitus with unspecified complications: Secondary | ICD-10-CM | POA: Diagnosis not present

## 2022-08-09 DIAGNOSIS — I428 Other cardiomyopathies: Secondary | ICD-10-CM | POA: Diagnosis not present

## 2022-08-09 DIAGNOSIS — G479 Sleep disorder, unspecified: Secondary | ICD-10-CM

## 2022-08-09 DIAGNOSIS — R002 Palpitations: Secondary | ICD-10-CM | POA: Diagnosis not present

## 2022-08-09 DIAGNOSIS — I2510 Atherosclerotic heart disease of native coronary artery without angina pectoris: Secondary | ICD-10-CM

## 2022-08-09 DIAGNOSIS — Z794 Long term (current) use of insulin: Secondary | ICD-10-CM

## 2022-08-09 NOTE — Progress Notes (Unsigned)
Applied a 14 day ZIo XT monitor to patient in the office    [place] to read

## 2022-08-09 NOTE — Patient Instructions (Signed)
Medication Instructions:  Your physician recommends that you continue on your current medications as directed. Please refer to the Current Medication list given to you today. *If you need a refill on your cardiac medications before your next appointment, please call your pharmacy*   Lab Work: None Ordered If you have labs (blood work) drawn today and your tests are completely normal, you will receive your results only by: MyChart Message (if you have MyChart) OR A paper copy in the mail If you have any lab test that is abnormal or we need to change your treatment, we will call you to review the results.   Testing/Procedures: Duanne Guess- Long Term Monitor Instructions  Your physician has requested you wear a ZIO patch monitor for 14 days.  This is a single patch monitor. [employer] supplies one patch monitor per enrollment. Additional stickers are not available. Please do not apply patch if you will be having a Nuclear Stress Test,  Echocardiogram, Cardiac CT, MRI, or Chest Xray during the period you would be wearing the  monitor. The patch cannot be worn during these tests. You cannot remove and re-apply the  ZIO XT patch monitor.  Your ZIO patch monitor will be mailed 3 day USPS to your address on file. It may take 3-5 days  to receive your monitor after you have been enrolled.  Once you have received your monitor, please review the enclosed instructions. Your monitor  has already been registered assigning a specific monitor serial # to you.  Billing and Patient Assistance Program Information  We have supplied [employer] with any of your insurance information on file for billing purposes. [employer] offers a sliding scale Patient Assistance Program for patients that do not have  insurance, or whose insurance does not completely cover the cost of the ZIO monitor.  You must apply for the Patient Assistance Program to qualify for this discounted rate.  To apply, please call [employer] at  [phone], select option 4, select option 2, ask to apply for  Patient Assistance Program. Meredeth Ide will ask your household income, and how many people  are in your household. They will quote your out-of-pocket cost based on that information.  [employer] will also be able to set up a 72-month, interest-free payment plan if needed.  Applying the monitor   Shave hair from upper left chest.  Hold abrader disc by orange tab. Rub abrader in 40 strokes over the upper left chest as  indicated in your monitor instructions.  Clean area with 4 enclosed alcohol pads. Let dry.  Apply patch as indicated in monitor instructions. Patch will be placed under collarbone on left  side of chest with arrow pointing upward.  Rub patch adhesive wings for 2 minutes. Remove white label marked "1". Remove the white  label marked "2". Rub patch adhesive wings for 2 additional minutes.  While looking in a mirror, press and release button in center of patch. A small green light will  flash 3-4 times. This will be your only indicator that the monitor has been turned on.  Do not shower for the first 24 hours. You may shower after the first 24 hours.  Press the button if you feel a symptom. You will hear a small click. Record Date, Time and  Symptom in the Patient Logbook.  When you are ready to remove the patch, follow instructions on the last 2 pages of Patient  Logbook. Stick patch monitor onto the last page of Patient Logbook.  Place  Patient Logbook in the blue and white box. Use locking tab on box and tape box closed  securely. The blue and white box has prepaid postage on it. Please place it in the mailbox as  soon as possible. Your physician should have your test results approximately 7 days after the  monitor has been mailed back to [facility].  Call [facility] Customer Care at [phone] if you have questions regarding  your ZIO XT patch monitor. Call them immediately if you see an orange light  blinking on your  monitor.  If your monitor falls off in less than 4 days, contact our Monitor department at [phone].  If your monitor becomes loose or falls off after 4 days call [employer] at [phone] for  suggestions on securing your monitor    Follow-Up: At [facility], you and your health needs are our priority.  As part of our continuing mission to provide you with exceptional heart care, we have created designated Provider Care Teams.  These Care Teams include your primary Cardiologist (physician) and Advanced Practice Providers (APPs -  Physician Assistants and Nurse Practitioners) who all work together to provide you with the care you need, when you need it.  We recommend signing up for the patient portal called "MyChart".  Sign up information is provided on this After Visit Summary.  MyChart is used to connect with patients for Virtual Visits (Telemedicine).  Patients are able to view lab/test results, encounter notes, upcoming appointments, etc.  Non-urgent messages can be sent to your provider as well.   To learn more about what you can do with MyChart, go to [email or web address].    Your next appointment:   3 month(s)  Provider:   Verne Carrow, MD  or Robin Searing, NP   Other Instructions Please check your weight daily. Please contact the office if you gain more than 3lbs in a day or 5lbs in a week. Limit your salt intake to 1500-2000mg  per day or 500mg  of Sodium per meal.  Check your blood pressure daily for 2 weeks, then contact the office with your readings.

## 2022-08-09 NOTE — Telephone Encounter (Signed)
Pt was seen in the office with Robin Searing, NP who ordered an Itamar study. Pt agreeable to signed waiver and not open the box until he has been called with the PIN #.

## 2022-08-12 ENCOUNTER — Other Ambulatory Visit: Payer: Self-pay

## 2022-08-12 MED ORDER — METOPROLOL SUCCINATE ER 25 MG PO TB24: 25.0000 mg | ORAL_TABLET | Freq: Every day | ORAL | 3 refills | Status: DC

## 2022-08-14 ENCOUNTER — Inpatient Hospital Stay (HOSPITAL_COMMUNITY): Payer: BC Managed Care – PPO

## 2022-08-15 ENCOUNTER — Other Ambulatory Visit (HOSPITAL_COMMUNITY): Payer: Self-pay | Admitting: *Deleted

## 2022-08-15 MED ORDER — SACUBITRIL-VALSARTAN 24-26 MG PO TABS: 1.0000 | ORAL_TABLET | Freq: Two times a day (BID) | ORAL | 3 refills | Status: DC

## 2022-08-16 ENCOUNTER — Encounter (HOSPITAL_COMMUNITY): Payer: Self-pay

## 2022-08-16 ENCOUNTER — Ambulatory Visit (HOSPITAL_COMMUNITY)
Admission: RE | Admit: 2022-08-16 | Discharge: 2022-08-16 | Disposition: A | Discharge status: Home / Self Care | Payer: BC Managed Care – PPO | Source: Ambulatory Visit | Attending: Family Medicine | Admitting: Family Medicine

## 2022-08-16 VITALS — BP 130/88 | HR 81 | Wt 275.0 lb

## 2022-08-16 DIAGNOSIS — Z7984 Long term (current) use of oral hypoglycemic drugs: Secondary | ICD-10-CM | POA: Diagnosis not present

## 2022-08-16 DIAGNOSIS — Z833 Family history of diabetes mellitus: Secondary | ICD-10-CM | POA: Diagnosis not present

## 2022-08-16 DIAGNOSIS — Z79899 Other long term (current) drug therapy: Secondary | ICD-10-CM | POA: Insufficient documentation

## 2022-08-16 DIAGNOSIS — E1122 Type 2 diabetes mellitus with diabetic chronic kidney disease: Secondary | ICD-10-CM | POA: Insufficient documentation

## 2022-08-16 DIAGNOSIS — I10 Essential (primary) hypertension: Secondary | ICD-10-CM | POA: Diagnosis not present

## 2022-08-16 DIAGNOSIS — Z8249 Family history of ischemic heart disease and other diseases of the circulatory system: Secondary | ICD-10-CM | POA: Diagnosis not present

## 2022-08-16 DIAGNOSIS — I130 Hypertensive heart and chronic kidney disease with heart failure and stage 1 through stage 4 chronic kidney disease, or unspecified chronic kidney disease: Secondary | ICD-10-CM | POA: Diagnosis not present

## 2022-08-16 DIAGNOSIS — I5022 Chronic systolic (congestive) heart failure: Secondary | ICD-10-CM | POA: Diagnosis not present

## 2022-08-16 DIAGNOSIS — I2510 Atherosclerotic heart disease of native coronary artery without angina pectoris: Secondary | ICD-10-CM | POA: Insufficient documentation

## 2022-08-16 DIAGNOSIS — I428 Other cardiomyopathies: Secondary | ICD-10-CM | POA: Diagnosis not present

## 2022-08-16 DIAGNOSIS — R0683 Snoring: Secondary | ICD-10-CM

## 2022-08-16 DIAGNOSIS — Z86711 Personal history of pulmonary embolism: Secondary | ICD-10-CM | POA: Diagnosis not present

## 2022-08-16 DIAGNOSIS — N183 Chronic kidney disease, stage 3 unspecified: Secondary | ICD-10-CM | POA: Diagnosis not present

## 2022-08-16 LAB — BASIC METABOLIC PANEL
Anion gap: 10 (ref 5–15)
BUN: 15 mg/dL (ref 6–20)
CO2: 23 mmol/L (ref 22–32)
Calcium: 8.7 mg/dL — ABNORMAL LOW (ref 8.9–10.3)
Chloride: 105 mmol/L (ref 98–111)
Creatinine, Ser: 1.25 mg/dL — ABNORMAL HIGH (ref 0.61–1.24)
GFR, Estimated: >60 mL/min (ref >60)
Glucose, Bld: 104 mg/dL — ABNORMAL HIGH (ref 70–99)
Potassium: 3.7 mmol/L (ref 3.5–5.1)
Sodium: 138 mmol/L (ref 135–145)

## 2022-08-16 MED ORDER — SPIRONOLACTONE 25 MG PO TABS: 25.0000 mg | ORAL_TABLET | Freq: Every day | ORAL | 3 refills | Status: DC

## 2022-08-16 NOTE — Progress Notes (Signed)
ADVANCED HF CLINIC CONSULT NOTE  Primary Care: Georgianne Fick, MD Primary Cardiologist: Verne Carrow, MD HF Cardiologist: Dr. Gasper Lloyd  HPI: Jacob Donaldson is a 62 y.o. male with a hx of type 2 DM, PE no longer on AC, and new diagnosis of systolic heart failure and CAD.    Admitted 4/24 with new diagnosis of acute systolic heart failure. Diuresed with IV lasix. Echo showed EF 30-35%, RV ok. Coronary CT showed mild nonobstructive CAD. GDMT titrated, and he was discharged home, weight 278 lbs.   Initially seen in [facility] 5/24, NYHA I-II and GDMT titrated. cMRI ordered to evaluated for infiltrative disease.   Today he returns for HF follow up. Overall feeling fine. He endorses occasional palpitations at his follow up with General Cardiology and now wearing Zio patch. He is not SOB with activity, walks up and down staircase several times a day. Denies further palpitations, abnormal bleeding, CP, dizziness, edema, or PND/Orthopnea. Appetite ok. No fever or chills. Weight at home 275 pounds. Taking all medications. Wife passed 2 years ago from HF. Previously worked as an Camera operator; played football, basketball and baseball. He has 2 adoptive children, lives alone, has GF. BP at home ~120s/70s.   No family history of cardiac issues.   Cardiac Testing  - Coronary CT (4/24): mild nonobstructive CAD   - Echo (4/24): EF 30-35%, RV ok   Review of Systems: [y] = yes, [ ]  = no    General: Weight gain [ ] ; Weight loss [ ] ; Anorexia [ ] ; Fatigue [ ] ; Fever [ ] ; Chills [ ] ; Weakness [ ]   Cardiac: Chest pain/pressure [ ] ; Resting SOB [ ] ; Exertional SOB [ ] ; Orthopnea [ ] ; Pedal Edema [ ] ; Palpitations [ ] ; Syncope [ ] ; Presyncope [ ] ; Paroxysmal nocturnal dyspnea[ ]   Pulmonary: Cough [ ] ; Wheezing[ ] ; Hemoptysis[ ] ; Sputum [ ] ; Snoring [ ]   GI: Vomiting[ ] ; Dysphagia[ ] ; Melena[ ] ; Hematochezia [ ] ; Heartburn[ ] ; Abdominal pain [ ] ; Constipation [ ] ; Diarrhea [ ] ; BRBPR [ ]   GU:  Hematuria[ ] ; Dysuria [ ] ; Nocturia[ ]   Vascular: Pain in legs with walking [ ] ; Pain in feet with lying flat [ ] ; Non-healing sores [ ] ; Stroke [ ] ; TIA [ ] ; Slurred speech [ ] ;  Neuro: Headaches[ ] ; Vertigo[ ] ; Seizures[ ] ; Paresthesias[ ] ;Blurred vision [ ] ; Diplopia [ ] ; Vision changes [ ]   Ortho/Skin: Arthritis [ ] ; Joint pain [ ] ; Muscle pain [ ] ; Joint swelling [ ] ; Back Pain [ ] ; Rash [ ]   Psych: Depression[ ] ; Anxiety[ ]   Heme: Bleeding problems [ ] ; Clotting disorders [ ] ; Anemia [ ]   Endocrine: Diabetes [place] ]; Thyroid dysfunction[ ]   Past Medical History:  Diagnosis Date   Acute CHF (congestive heart failure) (HCC) 07/04/2022   CKD (chronic kidney disease), stage II    Diabetes mellitus without complication (HCC)    Pulmonary embolism (HCC)    Current Outpatient Medications  Medication Sig Dispense Refill   aspirin EC 81 MG tablet Take 81 mg by mouth 2 (two) times a week. Swallow whole.     empagliflozin (JARDIANCE) 10 MG TABS tablet Take 1 tablet (10 mg total) by mouth daily before breakfast. 30 tablet 6   metoprolol succinate (TOPROL-XL) 25 MG 24 hr tablet Take 1 tablet (25 mg total) by mouth daily. 90 tablet 3   sacubitril-valsartan (ENTRESTO) 24-26 MG Take 1 tablet by mouth 2 (two) times daily. 60 tablet 3   No current facility-administered medications  for this encounter.   No Active Allergies  Social History   Socioeconomic History   Marital status: Widowed    Spouse name: Not on file   Number of children: Not on file   Years of education: Not on file   Highest education level: Not on file  Occupational History   Not on file  Tobacco Use   Smoking status: Never   Smokeless tobacco: Never  Substance and Sexual Activity   Alcohol use: No   Drug use: No   Sexual activity: Not on file  Other Topics Concern   Not on file  Social History Narrative   Not on file   Social Determinants of Health   Financial Resource Strain: Not on file  Food Insecurity: No Food  Insecurity (07/04/2022)   Hunger Vital Sign    Worried About Running Out of Food in the Last Year: Never true    Ran Out of Food in the Last Year: Never true  Transportation Needs: No Transportation Needs (07/04/2022)   PRAPARE - Administrator, Civil Service (Medical): No    Lack of Transportation (Non-Medical): No  Physical Activity: Inactive (07/22/2022)   Exercise Vital Sign    Days of Exercise per Week: 0 days    Minutes of Exercise per Session: 0 min  Stress: Not on file  Social Connections: Not on file  Intimate Partner Violence: Not At Risk (07/04/2022)   Humiliation, Afraid, Rape, and Kick questionnaire    Fear of Current or Ex-Partner: No    Emotionally Abused: No    Physically Abused: No    Sexually Abused: No   Family History  Problem Relation Age of Onset   Heart disease Mother    Diabetes Mellitus II Maternal Grandmother    BP 130/88   Pulse 81   Wt 124.7 kg (275 lb)   SpO2 99%   BMI 30.98 kg/m   Wt Readings from Last 3 Encounters:  08/16/22 124.7 kg (275 lb)  08/09/22 127.6 kg (281 lb 6.4 oz)  07/22/22 129.5 kg (285 lb 9.6 oz)   PHYSICAL EXAM: General:  NAD. No resp difficulty, walked into clinic  HEENT: Normal Neck: Supple. No JVD. Carotids 2+ bilat; no bruits. No lymphadenopathy or thryomegaly appreciated. Cor: PMI nondisplaced. Regular rate & rhythm. No rubs, gallops or murmurs. Lungs: Clear Abdomen: Soft, nontender, nondistended. No hepatosplenomegaly. No bruits or masses. Good bowel sounds. Extremities: No cyanosis, clubbing, rash, edema Neuro: Alert & oriented x 3, cranial nerves grossly intact. Moves all 4 extremities w/o difficulty. Affect pleasant.  ASSESSMENT & PLAN: Chronic Systolic Heart Failure - NICM - Echo (4/24): EF 30-35%, normal RV - etiology unclear, no family hx of CM. No drug use, HIV NR. BP elevated, ? HTN CM.  - NYHA I, volume ok today. Weight down 10 lbs. - Start spironolactone 25 mg daily. - Continue Jardiance 10 mg  daily. No GU symptoms. - Continue Toprol XL 25 mg daily - Continue Entresto 24/26 mg bid - Arrange for cMRI to evaluate for infiltrative CM, this has been scheduled 10/09/22. - Plan to repeat echo in 2 months after GDMT optimized. - BMET today, repeat in 1 week.   2.  CKD 3 - Baseline SCr 1.4. - Continue SGLT2i - Labs today.   3. CAD - Mild non obs on coronary CT. - No chest pain. - Continue ASA.   4. HTN - BP stable. - GDMT as above.   5. DM2 - A1C 7.1 - Continue  SGLT2i.   6. Hx of PE - No longer on AC   7. Snoring - Home sleep study arranged by [facility]  Follow up in 2-3 months with Dr. Gasper Lloyd + echo.  Prince Rome, FNP-BC 08/16/22

## 2022-08-16 NOTE — Patient Instructions (Addendum)
Thank you for coming in today  If you had labs drawn today, any labs that are abnormal the clinic will call you No news is good news  Medications: START Spironolactone 25 mg daily    Follow up appointments:  Your physician recommends that you return for lab work in:  1 week BMET  Your physician recommends that you schedule a follow-up appointment in:  Keep follow up with With Dr. Gasper Lloyd with echocardiogram      Do the following things EVERYDAY: Weigh yourself in the morning before breakfast. Write it down and keep it in a log. Take your medicines as prescribed Eat low salt foods--Limit salt (sodium) to 2000 mg per day.  Stay as active as you can everyday Limit all fluids for the day to less than 2 liters   At the [facility], you and your health needs are our priority. As part of our continuing mission to provide you with exceptional heart care, we have created designated Provider Care Teams. These Care Teams include your primary Cardiologist (physician) and Advanced Practice Providers (APPs- Physician Assistants and Nurse Practitioners) who all work together to provide you with the care you need, when you need it.   You may see any of the following providers on your designated Care Team at your next follow up: Dr Arvilla Meres Dr Marca Ancona Dr. Marcos Eke, NP Robbie Lis, [facility] [place], [place] Brynda Peon, NP Karle Plumber, PharmD   Please be sure to bring in all your medications bottles to every appointment.    Thank you for choosing [facility]  If you have any questions or concerns before your next appointment please send [place] a message through [place] or call our office at [phone].    TO LEAVE A MESSAGE FOR THE NURSE SELECT OPTION 2, PLEASE LEAVE A MESSAGE INCLUDING: YOUR NAME DATE OF BIRTH CALL BACK NUMBER REASON FOR CALL**this is important as we  prioritize the call backs  YOU WILL RECEIVE A CALL BACK THE SAME DAY AS LONG AS YOU CALL BEFORE 4:00 PM

## 2022-08-23 ENCOUNTER — Ambulatory Visit (HOSPITAL_COMMUNITY)
Admission: RE | Admit: 2022-08-23 | Discharge: 2022-08-23 | Disposition: A | Discharge status: Home / Self Care | Payer: BC Managed Care – PPO | Source: Ambulatory Visit | Attending: Cardiology | Admitting: Cardiology

## 2022-08-23 DIAGNOSIS — I5022 Chronic systolic (congestive) heart failure: Secondary | ICD-10-CM | POA: Insufficient documentation

## 2022-08-23 LAB — BASIC METABOLIC PANEL
Anion gap: 9 (ref 5–15)
BUN: 17 mg/dL (ref 6–20)
CO2: 21 mmol/L — ABNORMAL LOW (ref 22–32)
Calcium: 8.5 mg/dL — ABNORMAL LOW (ref 8.9–10.3)
Chloride: 106 mmol/L (ref 98–111)
Creatinine, Ser: 1.44 mg/dL — ABNORMAL HIGH (ref 0.61–1.24)
GFR, Estimated: 56 mL/min — ABNORMAL LOW (ref >60)
Glucose, Bld: 137 mg/dL — ABNORMAL HIGH (ref 70–99)
Potassium: 4 mmol/L (ref 3.5–5.1)
Sodium: 136 mmol/L (ref 135–145)

## 2022-08-27 ENCOUNTER — Other Ambulatory Visit: Payer: Self-pay

## 2022-08-27 MED ORDER — METOPROLOL SUCCINATE ER 50 MG PO TB24: 50.0000 mg | ORAL_TABLET | Freq: Every day | ORAL | 1 refills | Status: DC

## 2022-08-30 NOTE — Telephone Encounter (Signed)
Prior Authorization for [facility] sent to [facility] via web portal. Tracking Number . READY-NO PA REQ-REF# [id number]

## 2022-08-30 NOTE — Telephone Encounter (Signed)
Tried to call the pt to let him know sleep study approved. However recording came on put in your PIN# for the vm, it did this x 3 then hung up. I can try to reach the pt again on Monday.

## 2022-09-02 ENCOUNTER — Encounter: Payer: Self-pay | Admitting: *Deleted

## 2022-09-02 NOTE — Telephone Encounter (Signed)
I tried to reach the pt again about sleep study being approved, but same recording came on the phone stating to put in your pin #. There is no DPR on file. Pt is not signed up for MY CHART. I will send a letter to the pt to call our for the PIN#.

## 2022-09-23 ENCOUNTER — Encounter: Payer: Self-pay | Admitting: Cardiovascular Disease

## 2022-09-23 ENCOUNTER — Ambulatory Visit: Payer: BC Managed Care – PPO | Attending: Cardiovascular Disease | Admitting: Cardiovascular Disease

## 2022-09-23 ENCOUNTER — Telehealth: Payer: Self-pay | Admitting: Cardiovascular Disease

## 2022-09-23 VITALS — BP 140/80 | HR 68 | Ht 79.0 in | Wt 282.2 lb

## 2022-09-23 DIAGNOSIS — R002 Palpitations: Secondary | ICD-10-CM

## 2022-09-23 DIAGNOSIS — R079 Chest pain, unspecified: Secondary | ICD-10-CM | POA: Diagnosis not present

## 2022-09-23 DIAGNOSIS — R9431 Abnormal electrocardiogram [ECG] [EKG]: Secondary | ICD-10-CM | POA: Diagnosis not present

## 2022-09-23 MED ORDER — ENTRESTO 49-51 MG PO TABS: 1.0000 | ORAL_TABLET | Freq: Two times a day (BID) | ORAL | 3 refills | Status: DC

## 2022-09-23 NOTE — Progress Notes (Addendum)
Cardiology Office Note:  .   Date:  09/23/2022  ID:  Jacob Donaldson, DOB 07-09-1960, MRN [id number] PCP: Aviva Kluver  [facility] Providers Cardiologist:  Verne Carrow, MD {, [facility]   History of Present Illness: .   Jacob Donaldson is a 62 y.o. male with a history of systolic heart failure and coronary artery disease.  He was admitted April 24 with a new diagnosis of acute systolic heart failure.  Echo July 05, 2022  showed an EF of 30 to 35%.  Coronary CT angiogram showed mild nonobstructive coronary artery disease.  GDMT was titrated.  He was discharged home at a weight of 278 pounds.  He has a history of diabetes mellitus.  He has a history of a pulmonary embolus is no longer on anticoagulation.  He had some problems with palpitations and wore a Zio patch monitor.  The monitor revealed predominantly sinus rhythm.  He had 10 episodes of atrial tachycardia.  Toprol-XL was increased to 50 mg a day. Has very minimal CP associated with his palpitations  Might last a few seconds  He gets an irregular reading on his BP machine. Has been getting higher than normal readings on his BP cuff.   Walks in his house, Push mows his lawn   ROS:   Studies Reviewed: Marland Kitchen       EKG Interpretation Date/Time:  Monday September 23 2022 15:36:35 EDT Ventricular Rate:  68 PR Interval:  166 QRS Duration:  98 QT Interval:  382 QTC Calculation: 406 R Axis:   -26  Text Interpretation: Normal sinus rhythm Moderate voltage criteria for LVH, may be normal variant ( R in aVL , Cornell product ) Cannot rule out Anterior infarct , age undetermined When compared with ECG of 22-Jul-2022 11:31, No significant change was found Confirmed by Kristeen Miss [phone]) on 09/23/2022 5:24:39 PM    Risk Assessment/Calculations:     HYPERTENSION CONTROL Vitals:   09/23/22 1527 09/23/22 1557  BP: (!) 142/80 (!) 140/80    The patient's blood pressure is elevated above target today.  In order  to address the patient's elevated BP: A current anti-hypertensive medication was adjusted today.      STOP-Bang Score:  4      Physical Exam:   VS:  BP (!) 140/80   Pulse 68   Ht 6\' 7"  (2.007 m)   Wt 282 lb 3.2 oz (128 kg)   SpO2 99%   BMI 31.79 kg/m    Wt Readings from Last 3 Encounters:  09/23/22 282 lb 3.2 oz (128 kg)  08/16/22 275 lb (124.7 kg)  08/09/22 281 lb 6.4 oz (127.6 kg)    GEN: large , middle age male  in no acute distress NECK: No JVD; No carotid bruits CARDIAC: RRR, no murmurs, rubs, gallops RESPIRATORY:  Clear to auscultation without rales, wheezing or rhonchi  ABDOMEN: Soft, non-tender, non-distended EXTREMITIES:  No edema; No deformity   ASSESSMENT AND PLAN: .     1.  Palpitations: The main reason for his call into the office today was to be seen for palpitations.  He has already had these evaluated with a Zio patch monitor.  He is found to have episodes of supraventricular tachycardia.  These are very brief and nonsustained.  The episodes during the monitor time were 10 beats at most.  He is on Toprol-XL which seems to be helping a little bit.  I do not think that these need further evaluation at this time.  His baseline HR is normal .     We could consider getting a TSH if he continues to have these palpitations    2.  Chronic systolic congestive heart failure: Patient has a EF of 30 to 35%.  He is on low-dose Entresto.  Will increase the Entresto to 49-51 twice a day.  He has a follow-up appointment with Robin Searing, NP in about a month. He will need a follow-up basic metabolic profile at that time.        Dispo:   Signed, Kristeen Miss, MD

## 2022-09-23 NOTE — Telephone Encounter (Signed)
Pt c/o BP issue: STAT if pt c/o blurred vision, one-sided weakness or slurred speech  1. What are your last 5 BP readings? 172/90 - This morning 164/80 - Last night  2. Are you having any other symptoms (ex. Dizziness, headache, blurred vision, passed out)? No  3. What is your BP issue? Pt states BP has been elevated for the last 3 days as well as having an irregular heartbeat   Pt c/o of Chest Pain: STAT if CP now or developed within 24 hours  1. Are you having CP right now? No  2. Are you experiencing any other symptoms (ex. SOB, nausea, vomiting, sweating)? No  3. How long have you been experiencing CP? Few days  4. Is your CP continuous or coming and going? Comes and goes  5. Have you taken Nitroglycerin? No ?

## 2022-09-23 NOTE — Telephone Encounter (Signed)
The patients notes BP monitor reading irregular heartbeat for last few days.  In addition, he had been having some chest discomfort and looking back felt a flip flop kind of sensation in his chest.  He didn't think much about it until the BP monitor began picking up the irregular heartbeat.  Scheduled him with DOD today, pt aware of location and appreciative for the appointment.

## 2022-09-23 NOTE — Patient Instructions (Signed)
Medication Instructions:  INCREASE Entresto to 49/51mg  twice daily *If you need a refill on your cardiac medications before your next appointment, please call your pharmacy*   Lab Work: NONE If you have labs (blood work) drawn today and your tests are completely normal, you will receive your results only by: MyChart Message (if you have MyChart) OR A paper copy in the mail If you have any lab test that is abnormal or we need to change your treatment, we will call you to review the results.   Testing/Procedures: NONE   Follow-Up: At [facility], you and your health needs are our priority.  As part of our continuing mission to provide you with exceptional heart care, we have created designated Provider Care Teams.  These Care Teams include your primary Cardiologist (physician) and Advanced Practice Providers (APPs -  Physician Assistants and Nurse Practitioners) who all work together to provide you with the care you need, when you need it.  We recommend signing up for the patient portal called "MyChart".  Sign up information is provided on this After Visit Summary.  MyChart is used to connect with patients for Virtual Visits (Telemedicine).  Patients are able to view lab/test results, encounter notes, upcoming appointments, etc.  Non-urgent messages can be sent to your provider as well.   To learn more about what you can do with MyChart, go to [email or web address].    Your next appointment:   As Scheduled  Provider:   Verne Carrow, MD Robin Searing

## 2022-10-04 ENCOUNTER — Telehealth: Payer: Self-pay | Admitting: Cardiovascular Disease

## 2022-10-04 NOTE — Telephone Encounter (Signed)
  Pt c/o swelling/edema: STAT if pt has developed SOB within 24 hours  If swelling, where is the swelling located? Both feet  How much weight have you gained and in what time span? Not sure   Have you gained 2 pounds in a day or 5 pounds in a week? Not sure   Do you have a log of your daily weights (if so, list)? Not sure   Are you currently taking a fluid pill? Yes   Are you currently SOB? No   Have you traveled recently in a car or plane for an extended period of time? No   The patient mentioned that both of his feet are swelling more and retaining fluid. He denied experiencing shortness of breath and is currently taking fluid pill. He would like to know what steps he should take.

## 2022-10-04 NOTE — Telephone Encounter (Signed)
Returned call to patient, but no answer and no voicemail. Attempted a second call 5 minutes later, same response. Will re-attempt at later time.

## 2022-10-08 ENCOUNTER — Telehealth (HOSPITAL_COMMUNITY): Payer: Self-pay | Admitting: *Deleted

## 2022-10-08 ENCOUNTER — Telehealth (HOSPITAL_COMMUNITY): Payer: Self-pay | Admitting: Emergency Medicine

## 2022-10-08 NOTE — Telephone Encounter (Signed)
Unable to leave vm Sara Wallace RN Navigator Cardiac Imaging [facility] [phone] Office  [phone] Cell  

## 2022-10-08 NOTE — Telephone Encounter (Signed)
CMRI auth not required   Call ref #Janae H 10/08/22 2:00 pm   [phone] transferred to evicore per Graylin Shiver H with evicore auth not required

## 2022-10-09 ENCOUNTER — Ambulatory Visit (HOSPITAL_COMMUNITY): Admission: RE | Admit: 2022-10-09 | Payer: BC Managed Care – PPO | Source: Ambulatory Visit

## 2022-10-09 NOTE — Telephone Encounter (Signed)
Attempted to reach patient again. After multiple rings, voicemail picks up and states 'welcome to the message management center, please enter your mailbox number." Will re-attempt a third and final time.

## 2022-10-11 NOTE — Telephone Encounter (Signed)
Attempted to reach patient again (third time). After multiple rings, voicemail picks up and states 'welcome to the message management center, please enter your mailbox number."  Still no answer. Will close encounter at this time (one week since original call) assuming that patient will see multiple missed calls and/or call back if still having issues.

## 2022-10-21 ENCOUNTER — Telehealth: Payer: Self-pay | Admitting: Cardiovascular Disease

## 2022-10-21 MED ORDER — METOPROLOL SUCCINATE ER 50 MG PO TB24: 50.0000 mg | ORAL_TABLET | Freq: Every day | ORAL | 3 refills | Status: DC

## 2022-10-21 NOTE — Telephone Encounter (Signed)
?*  STAT* If patient is at the pharmacy, call can be transferred to refill team. ? ? ?1. Which medications need to be refilled? (please list name of each medication and dose if known) metoprolol succinate (TOPROL-XL) 50 MG 24 hr tablet ? ?2. Which pharmacy/location (including street and city if local pharmacy) is medication to be sent to?[id number] - [address], [place] ? ?3. Do they need a 30 day or 90 day supply? 90  ?

## 2022-10-21 NOTE — Telephone Encounter (Signed)
Pt's medication was sent to pt's pharmacy as requested. Confirmation received.  °

## 2022-11-04 ENCOUNTER — Ambulatory Visit (HOSPITAL_COMMUNITY)
Admission: RE | Admit: 2022-11-04 | Discharge: 2022-11-04 | Disposition: A | Discharge status: Home / Self Care | Payer: BC Managed Care – PPO | Source: Ambulatory Visit | Attending: Internal Medicine | Admitting: Internal Medicine

## 2022-11-04 ENCOUNTER — Ambulatory Visit (HOSPITAL_BASED_OUTPATIENT_CLINIC_OR_DEPARTMENT_OTHER)
Admission: RE | Admit: 2022-11-04 | Discharge: 2022-11-04 | Disposition: A | Discharge status: Home / Self Care | Payer: BC Managed Care – PPO | Source: Ambulatory Visit | Attending: Cardiology | Admitting: Cardiology

## 2022-11-04 ENCOUNTER — Encounter (HOSPITAL_COMMUNITY): Payer: Self-pay | Admitting: Cardiology

## 2022-11-04 VITALS — BP 166/86 | HR 66 | Wt 289.0 lb

## 2022-11-04 DIAGNOSIS — I5022 Chronic systolic (congestive) heart failure: Secondary | ICD-10-CM | POA: Diagnosis not present

## 2022-11-04 DIAGNOSIS — Z79899 Other long term (current) drug therapy: Secondary | ICD-10-CM | POA: Insufficient documentation

## 2022-11-04 DIAGNOSIS — I2510 Atherosclerotic heart disease of native coronary artery without angina pectoris: Secondary | ICD-10-CM | POA: Insufficient documentation

## 2022-11-04 DIAGNOSIS — N182 Chronic kidney disease, stage 2 (mild): Secondary | ICD-10-CM | POA: Diagnosis not present

## 2022-11-04 DIAGNOSIS — K589 Irritable bowel syndrome without diarrhea: Secondary | ICD-10-CM | POA: Diagnosis not present

## 2022-11-04 DIAGNOSIS — I5021 Acute systolic (congestive) heart failure: Secondary | ICD-10-CM | POA: Diagnosis not present

## 2022-11-04 DIAGNOSIS — Z86711 Personal history of pulmonary embolism: Secondary | ICD-10-CM | POA: Insufficient documentation

## 2022-11-04 DIAGNOSIS — E1122 Type 2 diabetes mellitus with diabetic chronic kidney disease: Secondary | ICD-10-CM | POA: Insufficient documentation

## 2022-11-04 DIAGNOSIS — F419 Anxiety disorder, unspecified: Secondary | ICD-10-CM | POA: Diagnosis not present

## 2022-11-04 DIAGNOSIS — E118 Type 2 diabetes mellitus with unspecified complications: Secondary | ICD-10-CM

## 2022-11-04 DIAGNOSIS — F32A Depression, unspecified: Secondary | ICD-10-CM | POA: Diagnosis not present

## 2022-11-04 DIAGNOSIS — I130 Hypertensive heart and chronic kidney disease with heart failure and stage 1 through stage 4 chronic kidney disease, or unspecified chronic kidney disease: Secondary | ICD-10-CM | POA: Diagnosis not present

## 2022-11-04 DIAGNOSIS — I10 Essential (primary) hypertension: Secondary | ICD-10-CM | POA: Diagnosis not present

## 2022-11-04 DIAGNOSIS — Z794 Long term (current) use of insulin: Secondary | ICD-10-CM

## 2022-11-04 LAB — ECHOCARDIOGRAM COMPLETE
AR max vel: 3.19 cm2
AV Area VTI: 2.72 cm2
AV Area mean vel: 3.32 cm2
AV Mean grad: 3 mmHg
AV Peak grad: 5.2 mmHg
Ao pk vel: 1.14 m/s
Calc EF: 27.7 %
S' Lateral: 4.5 cm
Single Plane A2C EF: 33 %
Single Plane A4C EF: 24.3 %

## 2022-11-04 LAB — BASIC METABOLIC PANEL
Anion gap: 6 (ref 5–15)
BUN: 14 mg/dL (ref 6–20)
CO2: 26 mmol/L (ref 22–32)
Calcium: 8.6 mg/dL — ABNORMAL LOW (ref 8.9–10.3)
Chloride: 106 mmol/L (ref 98–111)
Creatinine, Ser: 1.44 mg/dL — ABNORMAL HIGH (ref 0.61–1.24)
GFR, Estimated: 56 mL/min — ABNORMAL LOW (ref >60)
Glucose, Bld: 107 mg/dL — ABNORMAL HIGH (ref 70–99)
Potassium: 4.2 mmol/L (ref 3.5–5.1)
Sodium: 138 mmol/L (ref 135–145)

## 2022-11-04 LAB — BRAIN NATRIURETIC PEPTIDE: B Natriuretic Peptide: 154.8 pg/mL — ABNORMAL HIGH (ref 0.0–100.0)

## 2022-11-04 MED ORDER — ENTRESTO 97-103 MG PO TABS: 1.0000 | ORAL_TABLET | Freq: Two times a day (BID) | ORAL | 8 refills | Status: DC

## 2022-11-04 MED ORDER — CARVEDILOL 12.5 MG PO TABS: 12.5000 mg | ORAL_TABLET | Freq: Two times a day (BID) | ORAL | 8 refills | Status: DC

## 2022-11-04 MED ORDER — FUROSEMIDE 40 MG PO TABS: 40.0000 mg | ORAL_TABLET | ORAL | 1 refills | Status: DC | PRN

## 2022-11-04 NOTE — Progress Notes (Addendum)
ADVANCED HEART FAILURE CLINIC NOTE  Referring Physician: Georgianne Fick, MD  Primary Care: Pcp, No Primary Cardiologist: Dr. Clifton James   HPI: Jacob Donaldson is a 62 y.o. male with HFrEF (LVEF 30-35%), CAD (nonobstructive CAD by coronary CT), T2DM, hx of PE (off AC) presenting today to establish care.   His cardiac history dates back to April 2024 when he presented to [facility] with acute systolic heart failure requiring IV diuresis.  Echocardiogram at that time with a EF of 30 to 35% and preserved RV function.  Ischemic evaluation via coronary CT with mild nonobstructive CAD.  He was started on low-dose GDMT and discharged home.  Since that time he is followed in [facility] and app clinic where GDMT has been further uptitrated.  He previously worked as an Camera operator, played football/basketball/baseball.  Has 2 adoptive children, lives alone with his girlfriend.  Interval hx:  From a functional standpoint, he feels very well. Initially after starting GDMT he felt very fatigued; however, now no longer feels any functional limitations.  He performs all ADLs without any difficulty.  No PND, orthopnea, lower extremity edema.  Activity level/exercise tolerance: NYHA II Orthopnea:  Sleeps on 2 pillows Paroxysmal noctural dyspnea:  No Chest pain/pressure:  no Orthostatic lightheadedness:  no Palpitations:  no Lower extremity edema:  no Presyncope/syncope:  no Cough:  no  Past Medical History:  Diagnosis Date   Acute CHF (congestive heart failure) (HCC) 07/04/2022   CKD (chronic kidney disease), stage II    Diabetes mellitus without complication (HCC)    Pulmonary embolism (HCC)     Current Outpatient Medications  Medication Sig Dispense Refill   aspirin EC 81 MG tablet Take 81 mg by mouth 2 (two) times a week. Swallow whole.     empagliflozin (JARDIANCE) 10 MG TABS tablet Take 1 tablet (10 mg total) by mouth daily before breakfast. 30 tablet 6   metoprolol succinate  (TOPROL-XL) 50 MG 24 hr tablet Take 1 tablet (50 mg total) by mouth daily. Take with or immediately following a meal. 90 tablet 3   mirtazapine (REMERON) 15 MG tablet Take 15 mg by mouth at bedtime.     sacubitril-valsartan (ENTRESTO) 49-51 MG Take 1 tablet by mouth 2 (two) times daily. 180 tablet 3   spironolactone (ALDACTONE) 25 MG tablet Take 1 tablet (25 mg total) by mouth daily. 90 tablet 3   No current facility-administered medications for this encounter.    No Active Allergies    Social History   Socioeconomic History   Marital status: Widowed    Spouse name: Not on file   Number of children: Not on file   Years of education: Not on file   Highest education level: Not on file  Occupational History   Not on file  Tobacco Use   Smoking status: Never   Smokeless tobacco: Never  Substance and Sexual Activity   Alcohol use: No   Drug use: No   Sexual activity: Not on file  Other Topics Concern   Not on file  Social History Narrative   Not on file   Social Determinants of Health   Financial Resource Strain: Not on file  Food Insecurity: No Food Insecurity (07/04/2022)   Hunger Vital Sign    Worried About Running Out of Food in the Last Year: Never true    Ran Out of Food in the Last Year: Never true  Transportation Needs: No Transportation Needs (07/04/2022)   PRAPARE - Transportation  Lack of Transportation (Medical): No    Lack of Transportation (Non-Medical): No  Physical Activity: Inactive (07/22/2022)   Exercise Vital Sign    Days of Exercise per Week: 0 days    Minutes of Exercise per Session: 0 min  Stress: Not on file  Social Connections: Not on file  Intimate Partner Violence: Not At Risk (07/04/2022)   Humiliation, Afraid, Rape, and Kick questionnaire    Fear of Current or Ex-Partner: No    Emotionally Abused: No    Physically Abused: No    Sexually Abused: No      Family History  Problem Relation Age of Onset   Heart disease Mother    Diabetes  Mellitus II Maternal Grandmother     PHYSICAL EXAM: There were no vitals filed for this visit. GENERAL: Well nourished, well developed, and in no apparent distress at rest.  HEENT: Negative for arcus senilis or xanthelasma. There is no scleral icterus.  The mucous membranes are Jacob and moist.   NECK: Supple, No masses. Normal carotid upstrokes without bruits. No masses or thyromegaly.    CHEST: There are no chest wall deformities. There is no chest wall tenderness. Respirations are unlabored.  Lungs- CTA B/L CARDIAC:  JVP: 7 cm H2O         Normal S1, S2  Normal rate with regular rhythm. No murmurs, rubs or gallops.  Pulses are 2+ and symmetrical in upper and lower extremities. No edema.  ABDOMEN: Soft, non-tender, non-distended. There are no masses or hepatomegaly. There are normal bowel sounds.  EXTREMITIES: Warm and well perfused with no cyanosis, clubbing.  LYMPHATIC: No axillary or supraclavicular lymphadenopathy.  NEUROLOGIC: Patient is oriented x3 with no focal or lateralizing neurologic deficits.  PSYCH: Patients affect is appropriate, there is no evidence of anxiety or depression.  SKIN: Warm and dry; no lesions or wounds.   DATA REVIEW  ECG: 09/23/22: NSR  As per my personal interpretation  ECHO: 11/04/22: LVEF 30-35%,  As per my personal interpretation  Coronary CT:  1. Small bilateral pleural effusions unchanged from prior exam. 2. 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 Cannot r/o infiltrative disease (ie sarcoid). He appears to have some WMA. Cardiac MRI pending.  NYHA class / AHA Stage:II Volume status & Diuretics: mildly hypervolemic on exam; will take  lasix 40mg  x 2 then PRN.  Vasodilators:Increase Entresto to 97/171mtg IBD Beta-Blocker:D/C toprol; start coreg 12.5mg  BID QMV:[id number] 25mg  daily Cardiometabolic:jardiance 10mg  daily Devices therapies & Valvulopathies:not currently indicated; continue to uptitrate GDMT.  Advanced therapies:not currently indicated  2. Hypertension -Long history of uncontrolled hypertension.  Uptitrating antihypertensives as noted above.  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  3.  Anxiety/depression -Has significant anxiety since the passing of his wife.  Continue Remeron 15 mg nightly.  4. T2DM - continue jardiance. Followed by PPC.   5. Hx of PE - off AC now.   Jacob Donaldson Advanced Heart Failure Mechanical Circulatory Support

## 2022-11-04 NOTE — Patient Instructions (Addendum)
Good to see you today!  INCREASE Entresto to 97/103 mg Twice daily  STOP Metoprolol  START Coreg 12.5 mg Twice daily  Take Lasix 40 mg x 2 days then as  needed  Please follow up with our heart failure pharmacist in 1 month  Your physician recommends that you schedule a follow-up appointment in:  months    If you have any questions or concerns before your next appointment please send [place] a message through [place] or call our office at [phone].    TO LEAVE A MESSAGE FOR THE NURSE SELECT OPTION 2, PLEASE LEAVE A MESSAGE INCLUDING: YOUR NAME DATE OF BIRTH CALL BACK NUMBER REASON FOR CALL**this is important as we prioritize the call backs  YOU WILL RECEIVE A CALL BACK THE SAME DAY AS LONG AS YOU CALL BEFORE 4:00 PM At the Advanced Heart Failure Clinic, you and your health needs are our priority. As part of our continuing mission to provide you with exceptional heart care, we have created designated Provider Care Teams. These Care Teams include your primary Cardiologist (physician) and Advanced Practice Providers (APPs- Physician Assistants and Nurse Practitioners) who all work together to provide you with the care you need, when you need it.   You may see any of the following providers on your designated Care Team at your next follow up: Dr Arvilla Meres Dr Marca Ancona Dr. Marcos Eke, NP Robbie Lis, [place] [facility] [place], [place] Brynda Peon, NP Karle Plumber, PharmD   Please be sure to bring in all your medications bottles to every appointment.    Thank you for choosing [facility]

## 2022-11-05 NOTE — Progress Notes (Deleted)
Office Visit    Patient Name: Jacob Donaldson Date of Encounter: 11/05/2022  Primary Care Provider:  Pcp, No Primary Cardiologist:  Verne Carrow, MD Primary Electrophysiologist: None   Past Medical History    Past Medical History:  Diagnosis Date   Acute CHF (congestive heart failure) (HCC) 07/04/2022   CKD (chronic kidney disease), stage II    Diabetes mellitus without complication (HCC)    Pulmonary embolism (HCC)    No past surgical history on file.  Allergies  No Active Allergies   History of Present Illness    Jacob Donaldson is a 62 year old male with a PMH of HFrEF, nonobstructive CAD, NICM, unprovoked PE (no longer on AC), DM type II, FH of premature CAD who presents today for 72-month follow-up.  Jacob Donaldson was seen on 08/09/2022 for hospital follow-up.  During visit patient was endorsing mild fluttering and palpitations.  He was given a Zio patch for 14 days that showed predominantly sinus rhythm with 10 episodes of SVT lasting 12 beats.  We increased his Toprol-XL to 50 mg daily.  Patient was evaluated and found to have elevated STOP-BANG and home sleep study was ordered however not completed.  He was seen by DOD for urgent visit of chest pain and palpitations.  He was previously being treated as noted above and Entresto was increased to 49/51 mg daily.  He was seen by Dr. Gasper Lloyd for follow-up on 11/04/2022 and 2D echo was repeated with EF still reduced at 30-35% cardiac MR is currently pending to rule out infiltrative disease.  Entresto was increased to max dose 97/103 mg twice daily and carvedilol was started 12.5 mg twice daily due to EP.   Since last being seen in the office patient reports***.  Patient denies chest pain, palpitations, dyspnea, PND, orthopnea, nausea, vomiting, dizziness, syncope, edema, weight gain, or early satiety.     ***Notes:  Home Medications    Current Outpatient Medications  Medication Sig Dispense Refill   aspirin EC 81 MG  tablet Take 81 mg by mouth 2 (two) times a week. Swallow whole.     carvedilol (COREG) 12.5 MG tablet Take 1 tablet (12.5 mg total) by mouth 2 (two) times daily with a meal. 60 tablet 8   empagliflozin (JARDIANCE) 10 MG TABS tablet Take 1 tablet (10 mg total) by mouth daily before breakfast. 30 tablet 6   furosemide (LASIX) 40 MG tablet Take 1 tablet (40 mg total) by mouth as needed. 60 tablet 1   mirtazapine (REMERON) 15 MG tablet Take 15 mg by mouth at bedtime.     sacubitril-valsartan (ENTRESTO) 97-103 MG Take 1 tablet by mouth 2 (two) times daily. 60 tablet 8   spironolactone (ALDACTONE) 25 MG tablet Take 1 tablet (25 mg total) by mouth daily. 90 tablet 3   No current facility-administered medications for this visit.     Review of Systems  Please see the history of present illness.    (+)*** (+)***  All other systems reviewed and are otherwise negative except as noted above.  Physical Exam    Wt Readings from Last 3 Encounters:  11/04/22 289 lb (131.1 kg)  09/23/22 282 lb 3.2 oz (128 kg)  08/16/22 275 lb (124.7 kg)   WJ:XBJYN were no vitals filed for this visit.,There is no height or weight on file to calculate BMI.  Constitutional:      Appearance: Healthy appearance. Not in distress.  Neck:     Vascular: JVD normal.  Pulmonary:  Effort: Pulmonary effort is normal.     Breath sounds: No wheezing. No rales. Diminished in the bases Cardiovascular:     Normal rate. Regular rhythm. Normal S1. Normal S2.      Murmurs: There is no murmur.  Edema:    Peripheral edema absent.  Abdominal:     Palpations: Abdomen is soft non tender. There is no hepatomegaly.  Skin:    General: Skin is warm and dry.  Neurological:     General: No focal deficit present.     Mental Status: Alert and oriented to person, place and time.     Cranial Nerves: Cranial nerves are intact.  EKG/LABS/ Recent Cardiac Studies    ECG personally reviewed by me today - ***   Risk  Assessment/Calculations:   {Does this patient have ATRIAL FIBRILLATION?:[phone]}  STOP-Bang Score:  4  { Consider Dx Sleep Disordered Breathing or Sleep Apnea  ICD G47.33          :1}      Lab Results  Component Value Date   WBC 8.5 07/22/2022   HGB 14.8 07/22/2022   HCT 45.8 07/22/2022   MCV 95.8 07/22/2022   PLT 167 07/22/2022   Lab Results  Component Value Date   CREATININE 1.44 (H) 11/04/2022   BUN 14 11/04/2022   NA 138 11/04/2022   K 4.2 11/04/2022   CL 106 11/04/2022   CO2 26 11/04/2022   Lab Results  Component Value Date   ALT 18 07/04/2022   AST 36 07/04/2022   ALKPHOS 76 07/04/2022   BILITOT 0.6 07/04/2022   Lab Results  Component Value Date   CHOL 139 07/06/2022   HDL 25 (L) 07/06/2022   LDLCALC 97 07/06/2022   TRIG 87 07/06/2022   CHOLHDL 5.6 07/06/2022    Lab Results  Component Value Date   HGBA1C 7.1 (H) 07/05/2022     Assessment & Plan    1.HFrEF: -2D echo completed 06/2022 with EF of 30-35% with normal RV function.  2.  Essential hypertension:  3.NICM: -Patient underwent cardiac CT that showed mild nonobstructive CAD  4. Nonobstructive CAD: -Mild disease seen on coronary CT  5. Palpitations: -Patient reports complaints of palpitations that occur occasionally and are not sustained.      Disposition: Follow-up with Verne Carrow, MD or APP in *** months {Are you ordering a CV Procedure (e.g. stress test, cath, DCCV, TEE, etc)?   Press F2        :[id number]}   Medication Adjustments/Labs and Tests Ordered: Current medicines are reviewed at length with the patient today.  Concerns regarding medicines are outlined above.   Signed, Napoleon Form, Leodis Rains, NP 11/05/2022, 1:06 PM [facility]

## 2022-11-07 ENCOUNTER — Ambulatory Visit: Payer: BC Managed Care – PPO | Attending: Nurse Practitioner | Admitting: Nurse Practitioner

## 2022-11-07 DIAGNOSIS — I5020 Unspecified systolic (congestive) heart failure: Secondary | ICD-10-CM

## 2022-11-07 DIAGNOSIS — I428 Other cardiomyopathies: Secondary | ICD-10-CM

## 2022-11-07 DIAGNOSIS — R002 Palpitations: Secondary | ICD-10-CM

## 2022-11-07 DIAGNOSIS — I10 Essential (primary) hypertension: Secondary | ICD-10-CM

## 2022-11-07 DIAGNOSIS — I2510 Atherosclerotic heart disease of native coronary artery without angina pectoris: Secondary | ICD-10-CM

## 2022-12-05 ENCOUNTER — Inpatient Hospital Stay (HOSPITAL_COMMUNITY): Admission: RE | Admit: 2022-12-05 | Payer: BC Managed Care – PPO | Source: Ambulatory Visit

## 2022-12-16 ENCOUNTER — Telehealth (HOSPITAL_COMMUNITY): Payer: Self-pay | Admitting: Cardiology

## 2022-12-16 NOTE — Telephone Encounter (Signed)
Pt called to request refill pf remron Advised this medication is not managed by cardiology will need to contact pcp  Pt aware reports understanding Reports he will speak to provider at follow up 10/11

## 2022-12-19 NOTE — Progress Notes (Signed)
ADVANCED HEART FAILURE CLINIC NOTE  Referring Physician: No ref. provider found  Primary Care: Pcp, No Primary Cardiologist: Dr. Clifton James   HPI: Jacob Donaldson is a 62 y.o. male with HFrEF (LVEF 30-35%), CAD (nonobstructive CAD by coronary CT), T2DM, hx of PE (off AC) presenting today to establish care.   His cardiac history dates back to April 2024 when he presented to [facility] with acute systolic heart failure requiring IV diuresis.  Echocardiogram at that time with a EF of 30 to 35% and preserved RV function.  Ischemic evaluation via coronary CT with mild nonobstructive CAD.  He was started on low-dose GDMT and discharged home.  Since that time he is followed in [facility] and app clinic where GDMT has been further uptitrated.  He previously worked as an Camera operator, played football/basketball/baseball.  Has 2 adoptive children, lives alone with his girlfriend.  Interval hx:  Reports compliance with all medications. He goes on walks through his neighborhood (1/4 mile) daily. He has 27 steps in his home and reports no SOB when walking up or down.   Activity level/exercise tolerance: NYHA II Orthopnea:  Sleeps on 2 pillows Paroxysmal noctural dyspnea:  No Chest pain/pressure:  no Orthostatic lightheadedness:  no Palpitations:  no Lower extremity edema:  no Presyncope/syncope:  no Cough:  no  Past Medical History:  Diagnosis Date   Acute CHF (congestive heart failure) (HCC) 07/04/2022   CKD (chronic kidney disease), stage II    Diabetes mellitus without complication (HCC)    Pulmonary embolism (HCC)     Current Outpatient Medications  Medication Sig Dispense Refill   aspirin EC 81 MG tablet Take 81 mg by mouth 2 (two) times a week. Swallow whole.     carvedilol (COREG) 12.5 MG tablet Take 1 tablet (12.5 mg total) by mouth 2 (two) times daily with a meal. 60 tablet 8   empagliflozin (JARDIANCE) 10 MG TABS tablet Take 1 tablet (10 mg total) by mouth daily before  breakfast. 30 tablet 6   furosemide (LASIX) 40 MG tablet Take 1 tablet (40 mg total) by mouth as needed. 60 tablet 1   mirtazapine (REMERON) 15 MG tablet Take 15 mg by mouth at bedtime.     sacubitril-valsartan (ENTRESTO) 97-103 MG Take 1 tablet by mouth 2 (two) times daily. 60 tablet 8   spironolactone (ALDACTONE) 25 MG tablet Take 1 tablet (25 mg total) by mouth daily. 90 tablet 3   No current facility-administered medications for this encounter.    No Active Allergies    Social History   Socioeconomic History   Marital status: Widowed    Spouse name: Not on file   Number of children: Not on file   Years of education: Not on file   Highest education level: Not on file  Occupational History   Not on file  Tobacco Use   Smoking status: Never   Smokeless tobacco: Never  Substance and Sexual Activity   Alcohol use: No   Drug use: No   Sexual activity: Not on file  Other Topics Concern   Not on file  Social History Narrative   Not on file   Social Determinants of Health   Financial Resource Strain: Not on file  Food Insecurity: No Food Insecurity (07/04/2022)   Hunger Vital Sign    Worried About Running Out of Food in the Last Year: Never true    Ran Out of Food in the Last Year: Never true  Transportation Needs: No Transportation  Needs (07/04/2022)   PRAPARE - Administrator, Civil Service (Medical): No    Lack of Transportation (Non-Medical): No  Physical Activity: Inactive (07/22/2022)   Exercise Vital Sign    Days of Exercise per Week: 0 days    Minutes of Exercise per Session: 0 min  Stress: Not on file  Social Connections: Not on file  Intimate Partner Violence: Not At Risk (07/04/2022)   Humiliation, Afraid, Rape, and Kick questionnaire    Fear of Current or Ex-Partner: No    Emotionally Abused: No    Physically Abused: No    Sexually Abused: No      Family History  Problem Relation Age of Onset   Heart disease Mother    Diabetes Mellitus II  Maternal Grandmother     PHYSICAL EXAM: Vitals:   12/20/22 1132  BP: (!) 140/90  Pulse: 86  SpO2: 99%   GENERAL: Well nourished, well developed, and in no apparent distress at rest.  HEENT: Negative for arcus senilis or xanthelasma. There is no scleral icterus.  The mucous membranes are pink and moist.   NECK: Supple, No masses. Normal carotid upstrokes without bruits. No masses or thyromegaly.    CHEST: There are no chest wall deformities. There is no chest wall tenderness. Respirations are unlabored.  Lungs- CTA B/L CARDIAC:  JVP: 7 cm          Normal rate with regular rhythm. No murmurs, rubs or gallops.  Pulses are 2+ and symmetrical in upper and lower extremities. No edema.  ABDOMEN: Soft, non-tender, non-distended. There are no masses or hepatomegaly. There are normal bowel sounds.  EXTREMITIES: Warm and well perfused with no cyanosis, clubbing.  LYMPHATIC: No axillary or supraclavicular lymphadenopathy.  NEUROLOGIC: Patient is oriented x3 with no focal or lateralizing neurologic deficits.  PSYCH: Patients affect is appropriate, there is no evidence of anxiety or depression.  SKIN: Warm and dry; no lesions or wounds.    DATA REVIEW  ECG: 09/23/22: NSR  As per my personal interpretation  ECHO: 11/04/22: LVEF 30-35%,  As per my personal interpretation  Coronary CT:  1. Small bilateral pleural effusions unchanged from prior exam. 2. 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 Cannot r/o infiltrative disease (ie sarcoid). He appears to have some WMA. Cardiac MRI has still not been scheduled.   NYHA class / AHA Stage:II Volume status & Diuretics: euvolemic on exam;  lasix 20mg  every 2-3 days, PRN only.  Vasodilators: Entresto 97/103mg  BID; start amlodipine 5mg  daily.  Beta-Blocker: increase coreg 25mg  BID WUJ:[id number] 25mg  daily Cardiometabolic:jardiance 10mg  daily Devices therapies & Valvulopathies:not currently indicated; continue to uptitrate GDMT.  Advanced therapies:not currently indicated  2. Hypertension -Hypertensive today. - will uptitrate meds today.  - BMP/BNP today - start amlodipine 5mg  daily  3.  Anxiety/depression -Has significant anxiety since the passing of his wife.  -Will refill remeron; he reports it helps him sleep/improves anxiety.   4. T2DM - continue jardiance.  - Reports PCP checked A1C; was below 7  5. Hx of PE - off AC now. No SOB.   Erik Burkett Advanced Heart Failure Mechanical Circulatory Support

## 2022-12-20 ENCOUNTER — Ambulatory Visit (HOSPITAL_COMMUNITY)
Admission: RE | Admit: 2022-12-20 | Discharge: 2022-12-20 | Disposition: A | Discharge status: Home / Self Care | Payer: BC Managed Care – PPO | Source: Ambulatory Visit | Attending: Cardiology | Admitting: Cardiology

## 2022-12-20 ENCOUNTER — Encounter (HOSPITAL_COMMUNITY): Payer: Self-pay | Admitting: Cardiology

## 2022-12-20 VITALS — BP 140/90 | HR 86 | Wt 277.8 lb

## 2022-12-20 DIAGNOSIS — Z79899 Other long term (current) drug therapy: Secondary | ICD-10-CM | POA: Insufficient documentation

## 2022-12-20 DIAGNOSIS — F419 Anxiety disorder, unspecified: Secondary | ICD-10-CM | POA: Insufficient documentation

## 2022-12-20 DIAGNOSIS — N182 Chronic kidney disease, stage 2 (mild): Secondary | ICD-10-CM | POA: Diagnosis not present

## 2022-12-20 DIAGNOSIS — F32A Depression, unspecified: Secondary | ICD-10-CM | POA: Insufficient documentation

## 2022-12-20 DIAGNOSIS — E1122 Type 2 diabetes mellitus with diabetic chronic kidney disease: Secondary | ICD-10-CM | POA: Diagnosis not present

## 2022-12-20 DIAGNOSIS — Z86711 Personal history of pulmonary embolism: Secondary | ICD-10-CM | POA: Insufficient documentation

## 2022-12-20 DIAGNOSIS — I130 Hypertensive heart and chronic kidney disease with heart failure and stage 1 through stage 4 chronic kidney disease, or unspecified chronic kidney disease: Secondary | ICD-10-CM | POA: Diagnosis not present

## 2022-12-20 DIAGNOSIS — E118 Type 2 diabetes mellitus with unspecified complications: Secondary | ICD-10-CM

## 2022-12-20 DIAGNOSIS — I5030 Unspecified diastolic (congestive) heart failure: Secondary | ICD-10-CM | POA: Insufficient documentation

## 2022-12-20 DIAGNOSIS — I5022 Chronic systolic (congestive) heart failure: Secondary | ICD-10-CM | POA: Diagnosis not present

## 2022-12-20 DIAGNOSIS — I10 Essential (primary) hypertension: Secondary | ICD-10-CM | POA: Diagnosis not present

## 2022-12-20 DIAGNOSIS — Z794 Long term (current) use of insulin: Secondary | ICD-10-CM

## 2022-12-20 LAB — BASIC METABOLIC PANEL
Anion gap: 13 (ref 5–15)
BUN: 12 mg/dL (ref 6–20)
CO2: 21 mmol/L — ABNORMAL LOW (ref 22–32)
Calcium: 8.8 mg/dL — ABNORMAL LOW (ref 8.9–10.3)
Chloride: 103 mmol/L (ref 98–111)
Creatinine, Ser: 1.34 mg/dL — ABNORMAL HIGH (ref 0.61–1.24)
GFR, Estimated: >60 mL/min (ref >60)
Glucose, Bld: 112 mg/dL — ABNORMAL HIGH (ref 70–99)
Potassium: 3.8 mmol/L (ref 3.5–5.1)
Sodium: 137 mmol/L (ref 135–145)

## 2022-12-20 LAB — BRAIN NATRIURETIC PEPTIDE: B Natriuretic Peptide: 88 pg/mL (ref 0.0–100.0)

## 2022-12-20 MED ORDER — EMPAGLIFLOZIN 10 MG PO TABS: 10.0000 mg | ORAL_TABLET | Freq: Every day | ORAL | 11 refills | Status: DC

## 2022-12-20 MED ORDER — CARVEDILOL 25 MG PO TABS: 25.0000 mg | ORAL_TABLET | Freq: Two times a day (BID) | ORAL | 3 refills | Status: DC

## 2022-12-20 MED ORDER — SPIRONOLACTONE 25 MG PO TABS: 25.0000 mg | ORAL_TABLET | Freq: Every day | ORAL | 3 refills | Status: DC

## 2022-12-20 MED ORDER — AMLODIPINE BESYLATE 5 MG PO TABS: 5.0000 mg | ORAL_TABLET | Freq: Every day | ORAL | 3 refills | Status: DC

## 2022-12-20 NOTE — Patient Instructions (Addendum)
INCREASE Carvedilol to 25 mg Twice daily  START Amlodipine 50 mg daily.  Labs done today, your results will be available in MyChart, we will contact you for abnormal readings.  PLEASE CALL THE RADIOLOGY DEPARTMENT AT [phone], TO GET YOUR MRI TEST SCHEDULED.  Your physician recommends that you schedule a follow-up appointment in: 3 months ( January 2025) ** PLEASE CALL THE OFFICE IN NOVEMBER TO ARRANGE YOUR FOLLOW UP APPOINTMENT. **  If you have any questions or concerns before your next appointment please send [place] a message through [place] or call our office at [phone].    TO LEAVE A MESSAGE FOR THE NURSE SELECT OPTION 2, PLEASE LEAVE A MESSAGE INCLUDING: YOUR NAME DATE OF BIRTH CALL BACK NUMBER REASON FOR CALL**this is important as we prioritize the call backs  YOU WILL RECEIVE A CALL BACK THE SAME DAY AS LONG AS YOU CALL BEFORE 4:00 PM  At the Advanced [facility] Failure Clinic, you and your health needs are our priority. As part of our continuing mission to provide you with exceptional heart care, we have created designated Provider Care Teams. These Care Teams include your primary Cardiologist (physician) and Advanced Practice Providers (APPs- Physician Assistants and Nurse Practitioners) who all work together to provide you with the care you need, when you need it.   You may see any of the following providers on your designated Care Team at your next follow up: Dr Arvilla Meres Dr Marca Ancona Dr. Dorthula Nettles Dr. Clearnce Hasten Amy Filbert Schilder, NP Robbie Lis, [place] [facility] [place], [place] Brynda Peon, NP [place] Lee, NP Karle Plumber, PharmD   Please be sure to bring in all your medications bottles to every appointment.    Thank you for choosing [facility]

## 2023-01-22 ENCOUNTER — Other Ambulatory Visit (HOSPITAL_COMMUNITY): Payer: Self-pay | Admitting: Cardiology

## 2023-02-14 ENCOUNTER — Other Ambulatory Visit (HOSPITAL_COMMUNITY): Payer: Self-pay | Admitting: Family Medicine

## 2023-02-16 ENCOUNTER — Other Ambulatory Visit (HOSPITAL_COMMUNITY): Payer: Self-pay | Admitting: Family Medicine

## 2023-02-16 DIAGNOSIS — I5022 Chronic systolic (congestive) heart failure: Secondary | ICD-10-CM

## 2023-02-20 ENCOUNTER — Other Ambulatory Visit (HOSPITAL_COMMUNITY): Payer: Self-pay | Admitting: *Deleted

## 2023-02-20 ENCOUNTER — Telehealth (HOSPITAL_COMMUNITY): Payer: Self-pay | Admitting: Cardiology

## 2023-02-20 MED ORDER — AMLODIPINE BESYLATE 5 MG PO TABS: 5.0000 mg | ORAL_TABLET | Freq: Every day | ORAL | 3 refills | Status: DC

## 2023-02-20 NOTE — Telephone Encounter (Signed)
If not improving with taking lasix daily would recommend follow-up to assess volume status.

## 2023-02-20 NOTE — Telephone Encounter (Signed)
Patient called to report BLE No weights available although he reports weight is elevated No increase in fatigue or SOB  -reports he has been taking his PRN lasix at 40 daily for the past week and the swelling returns daily   Please advise

## 2023-02-28 NOTE — Telephone Encounter (Signed)
LMOM

## 2023-03-20 ENCOUNTER — Other Ambulatory Visit: Payer: Self-pay

## 2023-03-20 ENCOUNTER — Encounter (HOSPITAL_BASED_OUTPATIENT_CLINIC_OR_DEPARTMENT_OTHER): Payer: Self-pay

## 2023-03-20 ENCOUNTER — Emergency Department (HOSPITAL_BASED_OUTPATIENT_CLINIC_OR_DEPARTMENT_OTHER): Payer: BC Managed Care – PPO

## 2023-03-20 ENCOUNTER — Emergency Department (HOSPITAL_BASED_OUTPATIENT_CLINIC_OR_DEPARTMENT_OTHER)
Admission: EM | Admit: 2023-03-20 | Discharge: 2023-03-20 | Disposition: A | Discharge status: Home / Self Care | Payer: BC Managed Care – PPO | Attending: Emergency Medicine | Admitting: Emergency Medicine

## 2023-03-20 DIAGNOSIS — M5441 Lumbago with sciatica, right side: Secondary | ICD-10-CM | POA: Insufficient documentation

## 2023-03-20 DIAGNOSIS — X58XXXA Exposure to other specified factors, initial encounter: Secondary | ICD-10-CM | POA: Diagnosis not present

## 2023-03-20 DIAGNOSIS — I509 Heart failure, unspecified: Secondary | ICD-10-CM | POA: Insufficient documentation

## 2023-03-20 DIAGNOSIS — Z7982 Long term (current) use of aspirin: Secondary | ICD-10-CM | POA: Insufficient documentation

## 2023-03-20 DIAGNOSIS — S3992XA Unspecified injury of lower back, initial encounter: Secondary | ICD-10-CM | POA: Diagnosis not present

## 2023-03-20 DIAGNOSIS — E1122 Type 2 diabetes mellitus with diabetic chronic kidney disease: Secondary | ICD-10-CM | POA: Diagnosis not present

## 2023-03-20 DIAGNOSIS — I130 Hypertensive heart and chronic kidney disease with heart failure and stage 1 through stage 4 chronic kidney disease, or unspecified chronic kidney disease: Secondary | ICD-10-CM | POA: Insufficient documentation

## 2023-03-20 DIAGNOSIS — N189 Chronic kidney disease, unspecified: Secondary | ICD-10-CM | POA: Diagnosis not present

## 2023-03-20 DIAGNOSIS — S39012A Strain of muscle, fascia and tendon of lower back, initial encounter: Secondary | ICD-10-CM | POA: Diagnosis not present

## 2023-03-20 DIAGNOSIS — Z79899 Other long term (current) drug therapy: Secondary | ICD-10-CM | POA: Insufficient documentation

## 2023-03-20 DIAGNOSIS — M5431 Sciatica, right side: Secondary | ICD-10-CM

## 2023-03-20 MED ORDER — METHOCARBAMOL 500 MG PO TABS: 500.0000 mg | ORAL_TABLET | Freq: Two times a day (BID) | ORAL | 0 refills | Status: DC

## 2023-03-20 MED ORDER — OXYCODONE-ACETAMINOPHEN 5-325 MG PO TABS
1.0000 | ORAL_TABLET | Freq: Once | ORAL | Status: AC
  Administered 2023-03-20: 1 via ORAL
  Filled 2023-03-20: qty 1

## 2023-03-20 MED ORDER — PREDNISONE 20 MG PO TABS: 40.0000 mg | ORAL_TABLET | Freq: Every day | ORAL | 0 refills | Status: DC

## 2023-03-20 MED ORDER — HYDROCODONE-ACETAMINOPHEN 5-325 MG PO TABS
1.0000 | ORAL_TABLET | Freq: Four times a day (QID) | ORAL | 0 refills | Status: DC | PRN
Start: 2023-03-20 — End: 2023-07-22

## 2023-03-20 NOTE — ED Notes (Signed)
 Patient transported to X-ray

## 2023-03-20 NOTE — ED Triage Notes (Signed)
 Pt c/o back pain that goes down to his right leg. Pt has been taking pain meds w/o relief.

## 2023-03-20 NOTE — ED Provider Notes (Signed)
 [place] [facility] Provider Note   CSN: [id number] Arrival date & time: 03/20/23  1300     History  Chief Complaint  Patient presents with   Back Pain    Jacob Donaldson is a 63 y.o. male with past medical history significant for diabetes, hypertension, CHF, CKD who presents concern for severe right sided back pain that radiates down his right leg.  Patient reports that he has been taking Aleve, Tylenol , lidocaine patches and old muscle relaxant with no relief.  He reports he has tried physical therapy in the past.  He reports that it is leftover from an old sports injury and he has had pain on and off for years but that this is the worst that he is had.  He denies any fall.  Denies any numbness, tingling, saddle anesthesia, denies any loss control bladder, bowels.  He denies any history of IV drug use, chronic steroid use, or history of cancer.   Back Pain      Home Medications Prior to Admission medications   Medication Sig Start Date End Date Taking? Authorizing Provider  HYDROcodone -acetaminophen  (NORCO/VICODIN) 5-325 MG tablet Take 1 tablet by mouth every 6 (six) hours as needed. 03/20/23  Yes Terris Germano H, PA-C  methocarbamol  (ROBAXIN ) 500 MG tablet Take 1 tablet (500 mg total) by mouth 2 (two) times daily. 03/20/23  Yes Mirah Nevins H, PA-C  predniSONE  (DELTASONE ) 20 MG tablet Take 2 tablets (40 mg total) by mouth daily. 03/20/23  Yes Trinka Keshishyan H, PA-C  amLODipine  (NORVASC ) 5 MG tablet Take 1 tablet (5 mg total) by mouth daily. 02/20/23 05/21/23  Sabharwal, Aditya, DO  aspirin  EC 81 MG tablet Take 81 mg by mouth 2 (two) times a week. Swallow whole.    [provider]  carvedilol  (COREG ) 25 MG tablet Take 1 tablet (25 mg total) by mouth 2 (two) times daily with a meal. 12/20/22   Sabharwal, Aditya, DO  furosemide  (LASIX ) 40 MG tablet TAKE 1 TABLET BY MOUTH AS NEEDED 01/22/23   Sabharwal, Aditya, DO  JARDIANCE  10 MG  TABS tablet TAKE 1 TABLET(10 MG) BY MOUTH DAILY BEFORE BREAKFAST 02/17/23   [place], Harlene HERO, FNP  mirtazapine (REMERON) 15 MG tablet Take 15 mg by mouth at bedtime.    [provider]  sacubitril -valsartan  (ENTRESTO ) 97-103 MG Take 1 tablet by mouth 2 (two) times daily. 11/04/22   Sabharwal, Aditya, DO  spironolactone  (ALDACTONE ) 25 MG tablet Take 1 tablet (25 mg total) by mouth daily. 12/20/22 03/20/23  Sabharwal, Ria, DO      Allergies    Ivp dye [iodinated contrast media]    Review of Systems   Review of Systems  Musculoskeletal:  Positive for back pain.  All other systems reviewed and are negative.   Physical Exam Updated Vital Signs BP (!) 146/82 (BP Location: Right Arm)   Pulse (!) 59   Temp 97.9 F (36.6 C) (Oral)   Resp 18   SpO2 100%  Physical Exam Vitals and nursing note reviewed.  Constitutional:      General: He is not in acute distress.    Appearance: Normal appearance.  HENT:     Head: Normocephalic and atraumatic.  Eyes:     General:        Right eye: No discharge.        Left eye: No discharge.  Cardiovascular:     Rate and Rhythm: Normal rate and regular rhythm.     Pulses:  Normal pulses.     Heart sounds: No murmur heard.    No friction rub. No gallop.  Pulmonary:     Effort: Pulmonary effort is normal.     Breath sounds: Normal breath sounds.  Abdominal:     General: Bowel sounds are normal.     Palpations: Abdomen is soft.  Musculoskeletal:     Comments: Focal tenderness palpation in the right lumbar paraspinous muscles, no midline tenderness.  Positive straight leg raise on right.  He has slightly decreased strength to flexion of the right hip secondary to pain, 4/5.  Intact strength 5/5 of the left hip.  He is able to ambulate with some difficulty but endorses significant pain with ambulation.  Normal sensation throughout.  Skin:    General: Skin is warm and dry.     Capillary Refill: Capillary refill takes less than 2 seconds.   Neurological:     Mental Status: He is alert and oriented to person, place, and time.  Psychiatric:        Mood and Affect: Mood normal.        Behavior: Behavior normal.     ED Results / Procedures / Treatments   Labs (all labs ordered are listed, but only abnormal results are displayed) Labs Reviewed - No data to display  EKG None  Radiology DG Lumbar Spine Complete Result Date: 03/20/2023 CLINICAL DATA:  Low back pain with right-sided radiculopathy for 4 days. No known injury. EXAM: LUMBAR SPINE - COMPLETE 4+ VIEW COMPARISON:  Abdominopelvic CT 05/07/2015. FINDINGS: There are 5 lumbar type vertebral bodies. The alignment is stable with a minimal convex right scoliosis. There is no evidence of acute fracture, pars defect or traumatic subluxation. Multilevel spondylosis with disc space narrowing, endplate osteophytes and bilateral facet hypertrophy, greatest at L4-5, similar to previous abdominal CT. IMPRESSION: Multilevel lumbar spondylosis, greatest at L4-5. No acute osseous findings. Electronically Signed   By: Elsie Perone M.D.   On: 03/20/2023 13:57    Procedures Procedures    Medications Ordered in ED Medications  oxyCODONE -acetaminophen  (PERCOCET/ROXICET) 5-325 MG per tablet 1 tablet (has no administration in time range)    ED Course/ Medical Decision Making/ A&P                                 Medical Decision Making Amount and/or Complexity of Data Reviewed Radiology: ordered.   Patient with back pain.  My emergent differential diagnosis includes slipped disc, compression fracture, spondylolisthesis, less clinical concern for epidural abscess or osteomyelitis based on patient history.  Overall with high clinical suspicion for lumbar sprain, sciatica based on clinical presentation, risk factors.  No neurological deficits. Patient is ambulatory. No warning symptoms of back pain including: fecal incontinence, urinary retention or overflow incontinence, night sweats,  waking from sleep with back pain, unexplained fevers or weight loss, h/o cancer, IVDU, recent trauma. Overall low clinical concern for cauda equina, epidural abscess, or other serious cause of back pain.  I independently interpreted plain film radiograph of the lumbar spine which shows some multilevel lumbar spondylosis, worst at L4-5.  But no acute compression fracture or other osseous abnormality.  Conservative measures such as rest, ice/heat, ibuprofen, Tylenol , and  prescription for Robaxin  indicated, given the duration of symptoms for greater than 1 week, and the fact that he has tried a significant amount of home pain medicines already I do think it be reasonable to give him a  very short course of Norco as well as a burst of prednisone .  Discussed expected recovery time for sciatica/lumbar strain flare, and encourage close neurosurgical follow-up to discuss further evaluation and planning.  Patient understands agrees to plan. Extensive return precautions given, patient discharged in stable condition at this time.  Final Clinical Impression(s) / ED Diagnoses Final diagnoses:  Sciatica of right side  Strain of lumbar region, initial encounter    Rx / DC Orders ED Discharge Orders          Ordered    methocarbamol  (ROBAXIN ) 500 MG tablet  2 times daily        03/20/23 1429    HYDROcodone -acetaminophen  (NORCO/VICODIN) 5-325 MG tablet  Every 6 hours PRN        03/20/23 1429    predniSONE  (DELTASONE ) 20 MG tablet  Daily        03/20/23 1429              Lanis Storlie, [place] H, PA-C 03/20/23 1431    Dreama Longs, MD 03/20/23 2319

## 2023-03-20 NOTE — Discharge Instructions (Addendum)
 Please use Tylenol  or ibuprofen for pain.  You may use 600 mg ibuprofen every 6 hours or 1000 mg of Tylenol  every 6 hours.  You may choose to alternate between the 2.  This would be most effective.  Not to exceed 4 g of Tylenol  within 24 hours.  Not to exceed 3200 mg ibuprofen 24 hours.  You can use the muscle relaxant I am prescribing in addition to the above to help with any breakthrough pain.  You can take it up to twice daily.  It is safe to take at night, but I would be cautious taking it during the day as it can cause some drowsiness.  Make sure that you are feeling awake and alert before you get behind the wheel of a car or operate a motor vehicle.  It is not a narcotic pain medication so you are able to take it if it is not making you drowsy and still pilot a vehicle or machinery safely.  You can use the stronger narcotic pain medication in place of Tylenol  for severe breakthrough pain, I would limit use to nighttime, or if you are not working or palpating a vehicle.  Please take a laxative if you are going to take narcotic pain medication.

## 2023-04-17 ENCOUNTER — Telehealth (HOSPITAL_COMMUNITY): Payer: Self-pay | Admitting: Cardiology

## 2023-04-17 ENCOUNTER — Other Ambulatory Visit (HOSPITAL_COMMUNITY): Payer: Self-pay

## 2023-04-17 DIAGNOSIS — I5022 Chronic systolic (congestive) heart failure: Secondary | ICD-10-CM

## 2023-04-17 MED ORDER — FUROSEMIDE 40 MG PO TABS: 40.0000 mg | ORAL_TABLET | Freq: Every day | ORAL | 0 refills | Status: DC

## 2023-05-05 ENCOUNTER — Other Ambulatory Visit (HOSPITAL_COMMUNITY): Payer: Self-pay

## 2023-05-07 ENCOUNTER — Telehealth: Payer: Self-pay

## 2023-05-07 ENCOUNTER — Other Ambulatory Visit (HOSPITAL_COMMUNITY): Payer: Self-pay

## 2023-05-07 NOTE — Telephone Encounter (Signed)
 Pharmacy Patient Advocate Encounter  Received notification from [employer] that Prior Authorization for JARDIANCE has been APPROVED from 04/07/23 to 05/06/24. Ran test claim, Copay is $25. This test claim was processed through [facility]acy- copay amounts may vary at other pharmacies due to pharmacy/plan contracts, or as the patient moves through the different stages of their insurance plan.

## 2023-05-07 NOTE — Telephone Encounter (Signed)
 Pharmacy Patient Advocate Encounter   Received notification from CoverMyMeds that prior authorization for JARDIANCE is required/requested.   Insurance verification completed.   The patient is insured through [employer] .   Per test claim: PA required; PA submitted to above mentioned insurance via CoverMyMeds Key/confirmation #/EOC [id number] Status is pending

## 2023-05-22 ENCOUNTER — Telehealth (HOSPITAL_COMMUNITY): Payer: Self-pay | Admitting: Cardiology

## 2023-05-27 ENCOUNTER — Other Ambulatory Visit (HOSPITAL_COMMUNITY): Payer: Self-pay | Admitting: Cardiology

## 2023-05-27 DIAGNOSIS — I5022 Chronic systolic (congestive) heart failure: Secondary | ICD-10-CM

## 2023-06-11 ENCOUNTER — Other Ambulatory Visit (HOSPITAL_COMMUNITY): Payer: Self-pay | Admitting: Cardiology

## 2023-06-11 DIAGNOSIS — I5022 Chronic systolic (congestive) heart failure: Secondary | ICD-10-CM

## 2023-06-11 MED ORDER — FUROSEMIDE 40 MG PO TABS: 40.0000 mg | ORAL_TABLET | Freq: Every day | ORAL | 0 refills | Status: DC

## 2023-06-25 ENCOUNTER — Telehealth (HOSPITAL_COMMUNITY): Payer: Self-pay | Admitting: Cardiology

## 2023-06-25 NOTE — Telephone Encounter (Signed)
 Called to confirm/remind patient of their appointment at the [facility] on 06/25/2023.   Appointment:   [] Confirmed  [x] Left mess   [] No answer/No voice mail  [] Phone not in service  Patient reminded to bring all medications and/or complete list.  Confirmed patient has transportation. Gave directions, instructed to utilize valet parking.

## 2023-06-26 ENCOUNTER — Other Ambulatory Visit (HOSPITAL_COMMUNITY): Payer: Self-pay | Admitting: Cardiology

## 2023-06-26 ENCOUNTER — Encounter (HOSPITAL_COMMUNITY): Admitting: Cardiology

## 2023-06-26 DIAGNOSIS — I5022 Chronic systolic (congestive) heart failure: Secondary | ICD-10-CM

## 2023-06-26 MED ORDER — FUROSEMIDE 40 MG PO TABS: 40.0000 mg | ORAL_TABLET | Freq: Every day | ORAL | 0 refills | Status: DC

## 2023-06-27 ENCOUNTER — Other Ambulatory Visit (HOSPITAL_COMMUNITY): Payer: Self-pay | Admitting: Cardiology

## 2023-06-27 DIAGNOSIS — I5022 Chronic systolic (congestive) heart failure: Secondary | ICD-10-CM

## 2023-07-02 ENCOUNTER — Other Ambulatory Visit (HOSPITAL_COMMUNITY): Payer: Self-pay

## 2023-07-02 MED ORDER — ENTRESTO 97-103 MG PO TABS: 1.0000 | ORAL_TABLET | Freq: Two times a day (BID) | ORAL | 8 refills | Status: DC

## 2023-07-21 ENCOUNTER — Telehealth (HOSPITAL_COMMUNITY): Payer: Self-pay | Admitting: Cardiology

## 2023-07-21 NOTE — Telephone Encounter (Signed)
 Left message to call back Patient still needs pin from sleep team please send patient to sleep team.

## 2023-07-21 NOTE — Telephone Encounter (Signed)
 Called to confirm/remind patient of their appointment at the [facility] on 07/21/23 .   Appointment:   [x] Confirmed  [] Left mess   [] No answer/No voice mail  [] VM Full/unable to leave message  [] Phone not in service  Patient reminded to bring all medications and/or complete list.  Confirmed patient has transportation. Gave directions, instructed to utilize valet parking.

## 2023-07-22 ENCOUNTER — Telehealth: Payer: Self-pay

## 2023-07-22 ENCOUNTER — Ambulatory Visit (HOSPITAL_COMMUNITY): Payer: Self-pay | Admitting: Physician Assistant

## 2023-07-22 ENCOUNTER — Ambulatory Visit (HOSPITAL_COMMUNITY)
Admission: RE | Admit: 2023-07-22 | Discharge: 2023-07-22 | Disposition: A | Discharge status: Home / Self Care | Source: Ambulatory Visit | Attending: Physician Assistant | Admitting: Physician Assistant

## 2023-07-22 VITALS — BP 136/84 | HR 87 | Ht 78.0 in | Wt 280.2 lb

## 2023-07-22 DIAGNOSIS — I5020 Unspecified systolic (congestive) heart failure: Secondary | ICD-10-CM

## 2023-07-22 DIAGNOSIS — I10 Essential (primary) hypertension: Secondary | ICD-10-CM

## 2023-07-22 DIAGNOSIS — I428 Other cardiomyopathies: Secondary | ICD-10-CM | POA: Diagnosis not present

## 2023-07-22 DIAGNOSIS — Z86711 Personal history of pulmonary embolism: Secondary | ICD-10-CM | POA: Insufficient documentation

## 2023-07-22 DIAGNOSIS — I2510 Atherosclerotic heart disease of native coronary artery without angina pectoris: Secondary | ICD-10-CM | POA: Insufficient documentation

## 2023-07-22 DIAGNOSIS — I110 Hypertensive heart disease with heart failure: Secondary | ICD-10-CM | POA: Diagnosis not present

## 2023-07-22 DIAGNOSIS — Z7984 Long term (current) use of oral hypoglycemic drugs: Secondary | ICD-10-CM | POA: Diagnosis not present

## 2023-07-22 DIAGNOSIS — E119 Type 2 diabetes mellitus without complications: Secondary | ICD-10-CM | POA: Diagnosis not present

## 2023-07-22 DIAGNOSIS — Z79899 Other long term (current) drug therapy: Secondary | ICD-10-CM | POA: Insufficient documentation

## 2023-07-22 DIAGNOSIS — I5022 Chronic systolic (congestive) heart failure: Secondary | ICD-10-CM | POA: Insufficient documentation

## 2023-07-22 LAB — BASIC METABOLIC PANEL WITH GFR
Anion gap: 12 (ref 5–15)
BUN: 14 mg/dL (ref 6–20)
CO2: 21 mmol/L — ABNORMAL LOW (ref 22–32)
Calcium: 8.5 mg/dL — ABNORMAL LOW (ref 8.9–10.3)
Chloride: 105 mmol/L (ref 98–111)
Creatinine, Ser: 1.24 mg/dL (ref 0.61–1.24)
GFR, Estimated: >60 mL/min (ref >60)
Glucose, Bld: 147 mg/dL — ABNORMAL HIGH (ref 70–99)
Potassium: 3.8 mmol/L (ref 3.5–5.1)
Sodium: 138 mmol/L (ref 135–145)

## 2023-07-22 LAB — HEMOGLOBIN AND HEMATOCRIT, BLOOD
HCT: 45.5 % (ref 39.0–52.0)
Hemoglobin: 15.4 g/dL (ref 13.0–17.0)

## 2023-07-22 LAB — BRAIN NATRIURETIC PEPTIDE: B Natriuretic Peptide: 53.2 pg/mL (ref 0.0–100.0)

## 2023-07-22 MED ORDER — CARVEDILOL 25 MG PO TABS: 25.0000 mg | ORAL_TABLET | Freq: Two times a day (BID) | ORAL | 3 refills | Status: AC

## 2023-07-22 MED ORDER — EMPAGLIFLOZIN 10 MG PO TABS: 10.0000 mg | ORAL_TABLET | Freq: Every day | ORAL | 3 refills | Status: AC

## 2023-07-22 MED ORDER — ENTRESTO 97-103 MG PO TABS: 1.0000 | ORAL_TABLET | Freq: Two times a day (BID) | ORAL | 3 refills | Status: AC

## 2023-07-22 MED ORDER — SPIRONOLACTONE 25 MG PO TABS: 25.0000 mg | ORAL_TABLET | Freq: Every day | ORAL | 3 refills | Status: AC

## 2023-07-22 MED ORDER — FUROSEMIDE 40 MG PO TABS: 40.0000 mg | ORAL_TABLET | ORAL | 0 refills | Status: DC | PRN

## 2023-07-22 MED ORDER — AMLODIPINE BESYLATE 10 MG PO TABS: 10.0000 mg | ORAL_TABLET | Freq: Every day | ORAL | 3 refills | Status: DC

## 2023-07-22 NOTE — Patient Instructions (Signed)
 Great to see you today!!!  Medication Changes:  INCREASE Amlodipine  to 10 mg Daily  DECREASE Furosemide  to 40 mg AS NEEDED  Lab Work:  Labs done today, your results will be available in MyChart, we will contact you for abnormal readings.  Testing/Procedures:  Your physician has requested that you have a cardiac MRI. Cardiac MRI uses a computer to create images of your heart as its beating, producing both still and moving pictures of your heart and major blood vessels. For further information please visit [email or web address]. Please see instructions below, you will be called to schedule this.  Your physician has recommended that you have a sleep study. This test records several body functions during sleep, including: brain activity, eye movement, oxygen and carbon dioxide blood levels, heart rate and rhythm, breathing rate and rhythm, the flow of air through your mouth and nose, snoring, body muscle movements, and chest and belly movement. You will be called to schedule this   Special Instructions // Education:  Do the following things EVERYDAY: Weigh yourself in the morning before breakfast. Write it down and keep it in a log. Take your medicines as prescribed Eat low salt foods--Limit salt (sodium) to 2000 mg per day.  Stay as active as you can everyday Limit all fluids for the day to less than 2 liters   Follow-Up in: 3-4 months (August/Sept), **PLEASE CALL OUR OFFICE IN JULY TO SCHEDULE THIS APPOINTMENT   At the [facility], you and your health needs are our priority. We have a designated team specialized in the treatment of Heart Failure. This Care Team includes your primary Heart Failure Specialized Cardiologist (physician), Advanced Practice Providers (APPs- Physician Assistants and Nurse Practitioners), and Pharmacist who all work together to provide you with the care you need, when you need it.   You may see any of the following providers on your designated  Care Team at your next follow up:  Dr. Jules Oar Dr. Peder Bourdon Dr. Alwin Baars Dr. Judyth Nunnery Nieves Bars, NP Ruddy Corral, [facility] [place], [place] Dennise Fitz, NP [place] Lee, NP Luster Salters, PharmD   Please be sure to bring in all your medications bottles to every appointment.   Need to Contact Us :  If you have any questions or concerns before your next appointment please send us  a message through [place] or call our office at [phone].    TO LEAVE A MESSAGE FOR THE NURSE SELECT OPTION 2, PLEASE LEAVE A MESSAGE INCLUDING: YOUR NAME DATE OF BIRTH CALL BACK NUMBER REASON FOR CALL**this is important as we prioritize the call backs  YOU WILL RECEIVE A CALL BACK THE SAME DAY AS LONG AS YOU CALL BEFORE 4:00 PM

## 2023-07-22 NOTE — Progress Notes (Addendum)
 [facility] NOTE  Primary Care: Pcp, No Primary Cardiologist: Dr. Abel Hoe HF: Dr. Bruce Caper  HPI: Jacob Donaldson is a 63 y.o. male with HFrEF (LVEF 30-35%), CAD (nonobstructive CAD by coronary CT), T2DM, hx of PE (off AC).   His cardiac history dates back to April 2024 when he presented to [facility] with acute systolic heart failure requiring IV diuresis.  Echocardiogram at that time with a EF of 30 to 35% and preserved RV function.  Ischemic evaluation via coronary CT with mild nonobstructive CAD.    Here today for CHF follow-up. Doing well from HF perspective. Denies shortness of breath, orthopnea, PND or lower extremity edema. Not exercising routinely but able to walk a couple of blocks and climb stairs in his 3 story home without limitation. Taking all medications. SBP averaging 130s-140s. Drinking a lot of water. Watching sodium intake.  No ETOH or tobacco use.  He previously worked as an Camera operator, played football/basketball/baseball.  Has 2 adoptive children, lives alone with his girlfriend.   Current Outpatient Medications  Medication Sig Dispense Refill   aspirin  EC 81 MG tablet Take 81 mg by mouth 2 (two) times a week. Swallow whole.     amLODipine  (NORVASC ) 10 MG tablet Take 1 tablet (10 mg total) by mouth daily. 90 tablet 3   carvedilol  (COREG ) 25 MG tablet Take 1 tablet (25 mg total) by mouth 2 (two) times daily with a meal. 180 tablet 3   empagliflozin  (JARDIANCE ) 10 MG TABS tablet Take 1 tablet (10 mg total) by mouth daily. 90 tablet 3   furosemide  (LASIX ) 40 MG tablet Take 1 tablet (40 mg total) by mouth as needed. 90 tablet 0   sacubitril -valsartan  (ENTRESTO ) 97-103 MG Take 1 tablet by mouth 2 (two) times daily. 180 tablet 3   spironolactone  (ALDACTONE ) 25 MG tablet Take 1 tablet (25 mg total) by mouth daily. 90 tablet 3   No current facility-administered medications for this encounter.    PHYSICAL EXAM: Vitals:   07/22/23  1549  BP: 136/84  Pulse: 87   General:  Well appearing.  Neck: no JVD.  Cor: Regular rate & rhythm. No rubs, gallops or murmurs. Lungs: clear Abdomen: soft, nontender, nondistended.  Extremities: no edema Neuro: alert & orientedx3. Affect pleasant    DATA REVIEW  ECG: 09/23/22: NSR    ECHO: 4/24: EF 30-35%, RV okay 11/04/22: LVEF 30-35%, RV okay  Coronary CT:  1. Small bilateral pleural effusions unchanged from prior exam. 2. 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 Cannot r/o infiltrative disease (ie sarcoid). He appears to have some WMA. Cardiac MRI has still not been scheduled, reordered today NYHA class / AHA Stage:NYHA I-II Volume status & Diuretics: euvolemic on exam; previously took lasix  PRN now daily last couple of months (reports newest Rx instructions changed). Can cut back lasix  to PRN but needs to reduce fluid intake. BMET/BNP today Vasodilators: Entresto  97/103mg  BID; Beta-Blocker: Continue coreg  25mg  BID MRA:spironolactone  25mg  daily Cardiometabolic:jardiance  10mg  daily Devices therapies & Valvulopathies:not currently indicated, On optimal GDMT but still need to work on HTN Advanced therapies:not currently indicated  2. Hypertension - BP elevated  - Increase amlodipine  to 10 mg daily - Home sleep  study not previously completed. Reports never heard about insurance auth, will resubmit.  3. T2DM - continue jardiance .  - Last A1c we have on file is 7.1 in 04/24  4. Hx of PE - off AC now.   No PCP. Given list of [facility] providers  Follow-up: 3-4 months with Dr. Dorcus Gamble, PA-C

## 2023-07-22 NOTE — Telephone Encounter (Signed)
 Called patient regarding sleep study patient says he never heard anything back about whether insurance was going to cover cost states he still has the device in box unopened and that he is currently not having any issues sleeping, he will bring device in office when he comes today to return it.

## 2023-08-14 ENCOUNTER — Telehealth: Payer: Self-pay

## 2023-08-27 NOTE — Telephone Encounter (Signed)
 Per Jacob Donaldson with patient. Says he return the itamar watch during his last visit. He was told that he should continue with the study but was never called with with the clearance to proceed with the test. Patient did ask if his insurance was charged for the device. Told to follow up with the billing dept.

## 2023-08-27 NOTE — Telephone Encounter (Signed)
 Spoke w/pt he is aware ins was not charged for this, he inquires about in-lab sleep study that was ordered recently and says he has not been called to sch this, order was placed 07/22/23, mess to schedulers to f/u on scheduling, he is thankful for call

## 2023-09-01 NOTE — Telephone Encounter (Signed)
 Patient is scheduled for In-Lab sleep study 11/24/23

## 2023-09-17 ENCOUNTER — Other Ambulatory Visit (HOSPITAL_COMMUNITY): Payer: Self-pay | Admitting: Physician Assistant

## 2023-09-17 ENCOUNTER — Ambulatory Visit (HOSPITAL_COMMUNITY)
Admission: RE | Admit: 2023-09-17 | Discharge: 2023-09-17 | Disposition: A | Discharge status: Home / Self Care | Source: Ambulatory Visit | Attending: Physician Assistant | Admitting: Physician Assistant

## 2023-09-17 DIAGNOSIS — I5020 Unspecified systolic (congestive) heart failure: Secondary | ICD-10-CM

## 2023-09-17 MED ORDER — GADOBUTROL 1 MMOL/ML IV SOLN
10.0000 mL | Freq: Once | INTRAVENOUS | Status: AC | PRN
  Administered 2023-09-17: 10 mL via INTRAVENOUS

## 2023-09-18 ENCOUNTER — Ambulatory Visit (HOSPITAL_COMMUNITY): Payer: Self-pay | Admitting: Physician Assistant

## 2023-09-19 NOTE — Telephone Encounter (Signed)
 Pt aware Nurse visit scheduled 7/17 @ 2

## 2023-09-25 ENCOUNTER — Inpatient Hospital Stay (HOSPITAL_COMMUNITY)
Admission: RE | Admit: 2023-09-25 | Discharge: 2023-09-25 | Disposition: A | Discharge status: Home / Self Care | Source: Ambulatory Visit | Attending: Cardiology

## 2023-09-25 ENCOUNTER — Other Ambulatory Visit (HOSPITAL_COMMUNITY): Payer: Self-pay | Admitting: Cardiology

## 2023-09-25 ENCOUNTER — Ambulatory Visit (HOSPITAL_COMMUNITY)
Admission: RE | Admit: 2023-09-25 | Discharge: 2023-09-25 | Disposition: A | Discharge status: Home / Self Care | Source: Ambulatory Visit | Attending: Cardiology | Admitting: Cardiology

## 2023-09-25 DIAGNOSIS — I4891 Unspecified atrial fibrillation: Secondary | ICD-10-CM

## 2023-10-01 ENCOUNTER — Other Ambulatory Visit (HOSPITAL_COMMUNITY): Payer: Self-pay | Admitting: Cardiology

## 2023-10-01 DIAGNOSIS — I5020 Unspecified systolic (congestive) heart failure: Secondary | ICD-10-CM

## 2023-10-22 ENCOUNTER — Other Ambulatory Visit (HOSPITAL_COMMUNITY): Payer: Self-pay | Admitting: Physician Assistant

## 2023-10-22 DIAGNOSIS — I5020 Unspecified systolic (congestive) heart failure: Secondary | ICD-10-CM

## 2023-10-24 ENCOUNTER — Encounter (HOSPITAL_COMMUNITY)

## 2023-11-24 ENCOUNTER — Ambulatory Visit (HOSPITAL_BASED_OUTPATIENT_CLINIC_OR_DEPARTMENT_OTHER): Attending: Physician Assistant | Admitting: Cardiology

## 2024-03-16 ENCOUNTER — Other Ambulatory Visit (HOSPITAL_COMMUNITY): Payer: Self-pay

## 2024-03-16 DIAGNOSIS — I5022 Chronic systolic (congestive) heart failure: Secondary | ICD-10-CM

## 2024-03-16 MED ORDER — AMLODIPINE BESYLATE 10 MG PO TABS: 10.0000 mg | ORAL_TABLET | Freq: Every day | ORAL | 0 refills | Status: AC

## 2024-05-14 ENCOUNTER — Ambulatory Visit: Admitting: Family Medicine
